# Patient Record
Sex: Male | Born: 1975 | Race: Black or African American | Hispanic: No | Smoking: Former smoker
Health system: Southern US, Community
[De-identification: ages and names within clinical notes are randomized; demographics above are authoritative.]

## PROBLEM LIST (undated history)

## (undated) DIAGNOSIS — R809 Proteinuria, unspecified: Secondary | ICD-10-CM

## (undated) DIAGNOSIS — F419 Anxiety disorder, unspecified: Secondary | ICD-10-CM

## (undated) DIAGNOSIS — F17200 Nicotine dependence, unspecified, uncomplicated: Secondary | ICD-10-CM

## (undated) DIAGNOSIS — M549 Dorsalgia, unspecified: Secondary | ICD-10-CM

## (undated) DIAGNOSIS — G8929 Other chronic pain: Secondary | ICD-10-CM

## (undated) DIAGNOSIS — Z973 Presence of spectacles and contact lenses: Secondary | ICD-10-CM

## (undated) DIAGNOSIS — K5790 Diverticulosis of intestine, part unspecified, without perforation or abscess without bleeding: Secondary | ICD-10-CM

## (undated) DIAGNOSIS — E785 Hyperlipidemia, unspecified: Secondary | ICD-10-CM

## (undated) DIAGNOSIS — E119 Type 2 diabetes mellitus without complications: Secondary | ICD-10-CM

## (undated) DIAGNOSIS — L910 Hypertrophic scar: Secondary | ICD-10-CM

## (undated) DIAGNOSIS — I10 Essential (primary) hypertension: Secondary | ICD-10-CM

## (undated) DIAGNOSIS — F329 Major depressive disorder, single episode, unspecified: Secondary | ICD-10-CM

## (undated) DIAGNOSIS — F32A Depression, unspecified: Secondary | ICD-10-CM

## (undated) HISTORY — DX: Anxiety disorder, unspecified: F41.9

## (undated) HISTORY — DX: Proteinuria, unspecified: R80.9

## (undated) HISTORY — DX: Presence of spectacles and contact lenses: Z97.3

## (undated) HISTORY — DX: Essential (primary) hypertension: I10

## (undated) HISTORY — DX: Depression, unspecified: F32.A

## (undated) HISTORY — DX: Hyperlipidemia, unspecified: E78.5

## (undated) HISTORY — DX: Diverticulosis of intestine, part unspecified, without perforation or abscess without bleeding: K57.90

## (undated) HISTORY — PX: ANTERIOR CRUCIATE LIGAMENT REPAIR: SHX115

## (undated) HISTORY — DX: Dorsalgia, unspecified: M54.9

## (undated) HISTORY — DX: Hypertrophic scar: L91.0

## (undated) HISTORY — DX: Type 2 diabetes mellitus without complications: E11.9

## (undated) HISTORY — DX: Major depressive disorder, single episode, unspecified: F32.9

## (undated) HISTORY — DX: Other chronic pain: G89.29

## (undated) HISTORY — DX: Nicotine dependence, unspecified, uncomplicated: F17.200

---

## 1999-09-23 ENCOUNTER — Emergency Department (HOSPITAL_COMMUNITY): Admission: EM | Admit: 1999-09-23 | Discharge: 1999-09-23 | Payer: Self-pay | Admitting: Emergency Medicine

## 2003-01-19 ENCOUNTER — Emergency Department (HOSPITAL_COMMUNITY): Admission: EM | Admit: 2003-01-19 | Discharge: 2003-01-19 | Payer: Self-pay | Admitting: Emergency Medicine

## 2003-11-24 LAB — PULMONARY FUNCTION TEST

## 2004-07-23 ENCOUNTER — Emergency Department (HOSPITAL_COMMUNITY): Admission: EM | Admit: 2004-07-23 | Discharge: 2004-07-23 | Payer: Self-pay | Admitting: *Deleted

## 2004-10-22 ENCOUNTER — Emergency Department (HOSPITAL_COMMUNITY): Admission: EM | Admit: 2004-10-22 | Discharge: 2004-10-22 | Payer: Self-pay | Admitting: Emergency Medicine

## 2005-08-02 ENCOUNTER — Emergency Department (HOSPITAL_COMMUNITY): Admission: EM | Admit: 2005-08-02 | Discharge: 2005-08-03 | Payer: Self-pay | Admitting: Emergency Medicine

## 2005-11-07 ENCOUNTER — Ambulatory Visit: Payer: Self-pay | Admitting: Internal Medicine

## 2005-11-25 ENCOUNTER — Ambulatory Visit: Payer: Self-pay | Admitting: Internal Medicine

## 2005-11-25 ENCOUNTER — Ambulatory Visit: Payer: Self-pay | Admitting: *Deleted

## 2006-07-14 DIAGNOSIS — E119 Type 2 diabetes mellitus without complications: Secondary | ICD-10-CM

## 2006-07-14 DIAGNOSIS — I10 Essential (primary) hypertension: Secondary | ICD-10-CM

## 2006-07-14 DIAGNOSIS — E785 Hyperlipidemia, unspecified: Secondary | ICD-10-CM

## 2006-07-14 HISTORY — DX: Type 2 diabetes mellitus without complications: E11.9

## 2006-07-14 HISTORY — DX: Essential (primary) hypertension: I10

## 2006-07-14 HISTORY — DX: Hyperlipidemia, unspecified: E78.5

## 2006-07-24 ENCOUNTER — Ambulatory Visit: Payer: Self-pay | Admitting: Internal Medicine

## 2006-08-12 ENCOUNTER — Ambulatory Visit: Payer: Self-pay | Admitting: Internal Medicine

## 2007-03-31 ENCOUNTER — Encounter (INDEPENDENT_AMBULATORY_CARE_PROVIDER_SITE_OTHER): Payer: Self-pay | Admitting: *Deleted

## 2007-03-31 DIAGNOSIS — E7800 Pure hypercholesterolemia, unspecified: Secondary | ICD-10-CM

## 2007-03-31 DIAGNOSIS — Z9889 Other specified postprocedural states: Secondary | ICD-10-CM

## 2007-03-31 DIAGNOSIS — E118 Type 2 diabetes mellitus with unspecified complications: Secondary | ICD-10-CM

## 2007-03-31 DIAGNOSIS — E1165 Type 2 diabetes mellitus with hyperglycemia: Secondary | ICD-10-CM

## 2007-03-31 DIAGNOSIS — E1159 Type 2 diabetes mellitus with other circulatory complications: Secondary | ICD-10-CM

## 2007-03-31 DIAGNOSIS — I10 Essential (primary) hypertension: Secondary | ICD-10-CM

## 2007-03-31 DIAGNOSIS — L219 Seborrheic dermatitis, unspecified: Secondary | ICD-10-CM

## 2007-06-15 ENCOUNTER — Ambulatory Visit: Payer: Self-pay | Admitting: Internal Medicine

## 2007-06-15 DIAGNOSIS — F39 Unspecified mood [affective] disorder: Secondary | ICD-10-CM

## 2007-06-15 LAB — CONVERTED CEMR LAB
Blood in Urine, dipstick: NEGATIVE
Glucose, Urine, Semiquant: NEGATIVE
Hgb A1c MFr Bld: 6.4 %
Ketones, urine, test strip: NEGATIVE
Urobilinogen, UA: NEGATIVE
WBC Urine, dipstick: NEGATIVE
pH: 6.5

## 2007-06-27 ENCOUNTER — Encounter (INDEPENDENT_AMBULATORY_CARE_PROVIDER_SITE_OTHER): Payer: Self-pay | Admitting: Internal Medicine

## 2007-06-27 LAB — CONVERTED CEMR LAB
Albumin: 4.8 g/dL (ref 3.5–5.2)
Basophils Relative: 0 % (ref 0–1)
CO2: 27 meq/L (ref 19–32)
Chloride: 100 meq/L (ref 96–112)
Creatinine, Ser: 1.07 mg/dL (ref 0.40–1.50)
HCT: 47.3 % (ref 39.0–52.0)
HDL: 45 mg/dL (ref >39)
Lymphocytes Relative: 33 % (ref 12–46)
Lymphs Abs: 2 10*3/uL (ref 0.7–4.0)
MCHC: 33.6 g/dL (ref 30.0–36.0)
MCV: 93.3 fL (ref 78.0–100.0)
Monocytes Absolute: 0.6 10*3/uL (ref 0.1–1.0)
Neutrophils Relative %: 57 % (ref 43–77)
Potassium: 4.2 meq/L (ref 3.5–5.3)
RBC: 5.07 M/uL (ref 4.22–5.81)
Sodium: 138 meq/L (ref 135–145)
Total CHOL/HDL Ratio: 5
Total Protein: 8 g/dL (ref 6.0–8.3)
Triglycerides: 146 mg/dL (ref <150)

## 2007-08-19 ENCOUNTER — Ambulatory Visit: Payer: Self-pay | Admitting: Internal Medicine

## 2007-08-19 LAB — CONVERTED CEMR LAB: Blood Glucose, Fingerstick: 147

## 2008-07-19 ENCOUNTER — Telehealth (INDEPENDENT_AMBULATORY_CARE_PROVIDER_SITE_OTHER): Payer: Self-pay | Admitting: Internal Medicine

## 2008-08-04 ENCOUNTER — Ambulatory Visit: Payer: Self-pay | Admitting: Internal Medicine

## 2008-08-04 LAB — CONVERTED CEMR LAB
BUN: 14 mg/dL (ref 6–23)
Basophils Absolute: 0 10*3/uL (ref 0.0–0.1)
Basophils Relative: 0 % (ref 0–1)
CO2: 25 meq/L (ref 19–32)
Chloride: 101 meq/L (ref 96–112)
Creatinine, Ser: 1.13 mg/dL (ref 0.40–1.50)
Eosinophils Relative: 1 % (ref 0–5)
Glucose, Bld: 118 mg/dL — ABNORMAL HIGH (ref 70–99)
HCT: 46.4 % (ref 39.0–52.0)
Hemoglobin: 15.8 g/dL (ref 13.0–17.0)
LDL Cholesterol: 113 mg/dL — ABNORMAL HIGH (ref 0–99)
Lymphs Abs: 2.2 10*3/uL (ref 0.7–4.0)
MCHC: 34.1 g/dL (ref 30.0–36.0)
Microalb, Ur: 3.44 mg/dL — ABNORMAL HIGH (ref 0.00–1.89)
Neutrophils Relative %: 62 % (ref 43–77)
Platelets: 329 10*3/uL (ref 150–400)
RDW: 13.5 % (ref 11.5–15.5)
Sodium: 139 meq/L (ref 135–145)
Total Bilirubin: 0.4 mg/dL (ref 0.3–1.2)
Total CHOL/HDL Ratio: 3.9
Triglycerides: 93 mg/dL (ref <150)
VLDL: 19 mg/dL (ref 0–40)
WBC: 7.5 10*3/uL (ref 4.0–10.5)

## 2008-08-11 ENCOUNTER — Telehealth (INDEPENDENT_AMBULATORY_CARE_PROVIDER_SITE_OTHER): Payer: Self-pay | Admitting: Internal Medicine

## 2008-08-11 ENCOUNTER — Ambulatory Visit: Payer: Self-pay | Admitting: Internal Medicine

## 2008-08-11 DIAGNOSIS — F341 Dysthymic disorder: Secondary | ICD-10-CM | POA: Insufficient documentation

## 2008-08-13 ENCOUNTER — Encounter (INDEPENDENT_AMBULATORY_CARE_PROVIDER_SITE_OTHER): Payer: Self-pay | Admitting: Internal Medicine

## 2008-09-15 ENCOUNTER — Ambulatory Visit: Payer: Self-pay | Admitting: Internal Medicine

## 2008-10-03 ENCOUNTER — Telehealth (INDEPENDENT_AMBULATORY_CARE_PROVIDER_SITE_OTHER): Payer: Self-pay | Admitting: Internal Medicine

## 2008-10-12 ENCOUNTER — Ambulatory Visit: Payer: Self-pay | Admitting: Internal Medicine

## 2008-10-12 LAB — CONVERTED CEMR LAB: Blood Glucose, Fingerstick: 188

## 2008-11-14 ENCOUNTER — Ambulatory Visit: Payer: Self-pay | Admitting: Internal Medicine

## 2008-11-14 LAB — CONVERTED CEMR LAB
AST: 16 units/L (ref 0–37)
Albumin: 4.4 g/dL (ref 3.5–5.2)
Alkaline Phosphatase: 57 units/L (ref 39–117)
Cholesterol: 121 mg/dL (ref 0–200)
LDL Cholesterol: 62 mg/dL (ref 0–99)
Total Bilirubin: 0.5 mg/dL (ref 0.3–1.2)
Total CHOL/HDL Ratio: 2.9
Total Protein: 7.5 g/dL (ref 6.0–8.3)
Triglycerides: 87 mg/dL (ref <150)

## 2008-11-20 ENCOUNTER — Encounter (INDEPENDENT_AMBULATORY_CARE_PROVIDER_SITE_OTHER): Payer: Self-pay | Admitting: Internal Medicine

## 2009-10-25 ENCOUNTER — Ambulatory Visit: Payer: Self-pay | Admitting: Internal Medicine

## 2009-10-25 DIAGNOSIS — IMO0001 Reserved for inherently not codable concepts without codable children: Secondary | ICD-10-CM

## 2009-10-25 DIAGNOSIS — F528 Other sexual dysfunction not due to a substance or known physiological condition: Secondary | ICD-10-CM

## 2009-10-25 LAB — CONVERTED CEMR LAB: Blood Glucose, Fingerstick: 130

## 2009-10-30 ENCOUNTER — Ambulatory Visit: Payer: Self-pay | Admitting: Internal Medicine

## 2010-01-22 ENCOUNTER — Encounter (INDEPENDENT_AMBULATORY_CARE_PROVIDER_SITE_OTHER): Payer: Self-pay | Admitting: Internal Medicine

## 2010-08-13 NOTE — Letter (Signed)
Summary: SUMMARY OF DIABETIC CLINICAL FINDINGS  SUMMARY OF DIABETIC CLINICAL FINDINGS   Imported By: Arta Bruce 01/29/2010 11:39:30  _____________________________________________________________________  External Attachment:    Type:   Image     Comment:   External Document

## 2010-08-13 NOTE — Assessment & Plan Note (Signed)
Summary: dm/tmm   Vital Signs:  Patient profile:   35 year old male Weight:      257 pounds BMI:     34.03 Temp:     97.9 degrees F Pulse rate:   82 / minute Pulse rhythm:   regular Resp:     20 per minute BP sitting:   131 / 83  (left arm) Cuff size:   large  Vitals Entered By: Vesta Mixer CMA (October 25, 2009 9:53 AM) CC: f/u diabetes check Is Patient Diabetic? Yes Pain Assessment Patient in pain? no      CBG Result 130  Does patient need assistance? Ambulation Normal   CC:  f/u diabetes check.  History of Present Illness: 1.  Hypertension:  Taking meds regularly currently, but has not been compliant throughout the year.  2.  Hypercholesterolemia: was at goal last spring, but up and down with taking meds.  Feels he is having muscle aches on and off.  Symptoms in arms mainly--occurs for a minute, then goes away.  Occurs every other day.  3.  DM:  Sugars running between 90 and 140.  A1C today is: 6.5%    Has not had an eye checked in some time.  No numbness and tingling in hands and feet.  Not checking feet nightly.    4.  Depression:  Still taking Zoloft.  States other notice if he misses it.  They tell him he looks anxious, irritated and has problems controlling anger when he is off.  5.  ED:  for a year.  Went and obtained a root supplement called [employer]--helped only the first time.  Able to get an erection--definitely with nighttime tumescence that is good.  But with intercourse, not as good as he feels it should be.  Difficulties maintaining the erection.  Pt. not with a particular partner and basically when having sex, that is the only connection with the other individual.  He does feel like he feels pressured at this point not to fail with an erection.  Not interested in counseling.  Current Medications (verified): 1)  Hydrochlorothiazide 25 Mg  Tabs (Hydrochlorothiazide) .Marland Kitchen.. 1 By Mouth Q.a.m. 2)  Metformin Hcl 1000 Mg  Tabs (Metformin Hcl) .Marland Kitchen.. 1 By Mouth Two  Times A Day With Meals 3)  Nizoral 2 %  Sham (Ketoconazole) 4)  Benazepril Hcl 20 Mg  Tabs (Benazepril Hcl) .Marland Kitchen.. 1 Tab By Mouth Daily 5)  Lipitor 20 Mg  Tabs (Atorvastatin Calcium) .Marland Kitchen.. 1 Tab By Mouth Daily 6)  Zoloft 50 Mg Tabs (Sertraline Hcl) .Marland Kitchen.. 1 Tab By Mouth Daily  Allergies (verified): 1)  ! Penicillin  Physical Exam  Lungs:  Normal respiratory effort, chest expands symmetrically. Lungs are clear to auscultation, no crackles or wheezes. Heart:  Normal rate and regular rhythm. S1 and S2 normal without gallop, murmur, click, rub or other extra sounds.  Radial pulses normal and equal Extremities:  No edema   Impression & Recommendations:  Problem # 1:  MUSCLE PAIN (ICD-729.1)  Orders: T-CK Total ([id number])  Problem # 2:  DEPRESSION/ANXIETY (ICD-300.4) Doing well on Zoloft--continue  Problem # 3:  HYPERCHOLESTEROLEMIA (ICD-272.0) Nonfasting today His updated medication list for this problem includes:    Lipitor 20 Mg Tabs (Atorvastatin calcium) .Marland Kitchen... 1 tab by mouth daily  Problem # 4:  DM (ICD-250.00) Well controlled. Encouraged weight loss with lifestyle changes His updated medication list for this problem includes:    Metformin Hcl 1000 Mg Tabs (Metformin hcl) .Marland Kitchen... 1 by  mouth two times a day with meals    Benazepril Hcl 20 Mg Tabs (Benazepril hcl) .Marland Kitchen... 1 tab by mouth daily  Problem # 5:  HYPERTENSION (ICD-401.9) Controlled His updated medication list for this problem includes:    Hydrochlorothiazide 25 Mg Tabs (Hydrochlorothiazide) .Marland Kitchen... 1 by mouth q.a.m.    Benazepril Hcl 20 Mg Tabs (Benazepril hcl) .Marland Kitchen... 1 tab by mouth daily  Problem # 6:  ERECTILE DYSFUNCTION, NON-ORGANIC (ICD-302.72) Suspect nonorganic based on pt's history, but certainly has risk factors for organic or medication cause. Pt. not interested in counseling. Will let me know if worsens and will then need to consider changing meds.  Complete Medication List: 1)  Hydrochlorothiazide 25 Mg  Tabs (Hydrochlorothiazide) .Marland Kitchen.. 1 by mouth q.a.m. 2)  Metformin Hcl 1000 Mg Tabs (Metformin hcl) .Marland Kitchen.. 1 by mouth two times a day with meals 3)  Nizoral 2 % Sham (Ketoconazole) 4)  Benazepril Hcl 20 Mg Tabs (Benazepril hcl) .Marland Kitchen.. 1 tab by mouth daily 5)  Lipitor 20 Mg Tabs (Atorvastatin calcium) .Marland Kitchen.. 1 tab by mouth daily 6)  Zoloft 50 Mg Tabs (Sertraline hcl) .Marland Kitchen.. 1 tab by mouth daily  Other Orders: Capillary Blood Glucose/CBG (78295)  Patient Instructions: 1)  Schedule [facility] 2)  FLP, CMET, CBC, Urine microalbumin, UA  with fasting lab appt in next 2 weeks. 3)  CPP with Dr. Delrae Alfred in 4 months  Prescriptions: BENAZEPRIL HCL 20 MG  TABS (BENAZEPRIL HCL) 1 tab by mouth daily  #30 Each x 11   Entered and Authorized by:   Julieanne Manson MD   Signed by:   Julieanne Manson MD on 10/25/2009   Method used:   Electronically to        [facility] [phone]* (retail)       [address]       [place], [place]  [address]       Ph: [phone]       Fax: [phone]   RxID:   [id number] METFORMIN HCL 1000 MG  TABS (METFORMIN HCL) 1 by mouth two times a day with meals  #60 Each x 11   Entered and Authorized by:   Julieanne Manson MD   Signed by:   Julieanne Manson MD on 10/25/2009   Method used:   Electronically to        [facility] [phone]* (retail)       [address]       [place], [place]  [address]       Ph: [phone]       Fax: [phone]   RxID:   [id number] HYDROCHLOROTHIAZIDE 25 MG  TABS (HYDROCHLOROTHIAZIDE) 1 by mouth q.a.m.  #30 Each x 11   Entered and Authorized by:   Julieanne Manson MD   Signed by:   Julieanne Manson MD on 10/25/2009   Method used:   Electronically to        [facility] [phone]* (retail)       [address]       [place], [place]  [address]       Ph: [phone]       Fax: [phone]   RxID:   [id number] ZOLOFT 50 MG TABS (SERTRALINE HCL) 1 tab by mouth daily  #30 x 11   Entered and Authorized by:    Julieanne Manson MD   Signed by:   Julieanne Manson MD on 10/25/2009   Method used:   Faxed to .Marland KitchenMarland Kitchen  [facility] (retail)       [address] [place], [place]  [address]       Ph: [phone]       Fax: [phone]   RxID:   [id number] LIPITOR 20 MG  TABS (ATORVASTATIN CALCIUM) 1 tab by mouth daily  #30 x 11   Entered and Authorized by:   Julieanne Manson MD   Signed by:   Julieanne Manson MD on 10/25/2009   Method used:   Faxed to ...       [facility] (retail)       [address] [place], [place]  [address]       Ph: [phone]       Fax: [phone]   RxID:   [id number]

## 2010-11-29 NOTE — Consult Note (Signed)
NAMEABRAN, GAVIGAN NO.:  [id number]   MEDICAL RECORD NO.:  [id number]          PATIENT TYPE:  EMS   LOCATION:  [facility]                         FACILITY:  [facility]   PHYSICIAN:  Danae Chen, M.D.DATE OF BIRTH:  24-May-1976   DATE OF CONSULTATION:  07/23/2004  DATE OF DISCHARGE:                                   CONSULTATION   PRIMARY CARE PHYSICIAN:  Ernestina Penna, M.D.   CHIEF COMPLAINT:  New diagnosis of diabetes with hyperglycemia.   HISTORY OF PRESENT ILLNESS:  The patient is a very pleasant African American  male who presented to Dr. Christell Constant today for the very first time to establish  as new patient with complaints of three-month history of around a 40-pound  weight loss, polydipsia, polyuria and nocturia.  He has a strong family  history of diabetes with both the grandmother and father with diabetes and  hypertension and his dad is on dialysis secondary to end-stage renal  disease.  The patient has not been seen by a doctor prior to his visit  today.  Aside from his weight loss and urinary frequency and thirst, he  denies any other symptoms such as chest pain, dizziness, blurred vision or  numbness and tingling in his hands or feet.   PAST MEDICAL HISTORY:  Not significant.  He does report that he has had high  blood pressure when checked sporadically but never been on medication.   PAST SURGICAL HISTORY:  Torn ACL secondary to football injury.   FAMILY HISTORY:  As noted, father with end-stage renal disease secondary to  diabetes and hypertension.  Grandmother with diabetes.   SOCIAL HISTORY:  He does smoke about a half pack per day.  Drinks occasional  beer.  He is single.  No children.  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   REVIEW OF SYMPTOMS:  Per history of present illness.   MEDICATIONS:  He is on no medications currently.   ALLERGIES:  No known drug allergies.   PHYSICAL  EXAMINATION:  GENERAL APPEARANCE:  He is in no acute distress.  VITAL SIGNS:  Temperature 98.2, blood pressure 148/96, pulse 104,  respiratory rate 22, O2 saturation 96% on room air.  Following the  administration of two liters IV fluid, his pulse was at 70, blood pressure  140/72.  HEENT:  Funduscopic examination is normal. Pupils are equal and reactive.  NECK:  Supple. No lymphadenopathy.  LUNGS:  Clear.  CARDIOVASCULAR:  Regular rate.  ABDOMEN:  Soft.  EXTREMITIES:  No peripheral edema.  NEUROLOGIC:  He has pinpoint prick sensation and two-point discretion is  intact bilaterally in his lower extremities.  Cranial nerves II-XII grossly  intact.   LABORATORY DATA:  Glucose of 542, sodium 129, potassium 4.4, chloride 94,  CO2 22, BUN 16, creatinine 1.2.  Calcium 9.4, total protein 7.6, albumin  3.7, AST 31, ALT 31, alkaline phosphatase 84, total bilirubin 1.3.  White  count 7.4, hemoglobin 6.2, platelets 268.  Urinalysis shows a specific  gravity of 1.036, urine glucose is high,  small amount of ketone, no protein.  He has received 14 units of insulin in the ED as well as normal saline two  liters.   He currently has no complaints and feels well.   His weight approximately was 310 pounds three months ago.  In the office  today and in primary care's office, was 269.   IMPRESSION:  A 35 year old with new diagnosis of diabetes and probably  hypertension.  His cardiac risk factors also include tobacco use, family  history of diabetes and hypertension.   PLAN:  1.  We will start him on an ACE inhibitor, diuretic and oral hypoglycemics.      Since he is self-employed and paying for his own medications, we are      attempting to give him low cost medicines.  Benzopril 20 mg scored      tablets, half tablet one p.o. daily.  Glipizide 10 mg one p.o. daily.      Hydrochlorothiazide 25 mg one p.o. daily.  Metformin 500 mg one p.o.      b.i.d.  He also will be instructed to take an aspirin  one baby aspirin      once daily.  The patient has a follow-up appointment with Dr. Christell Constant on      Tuesday, July 30, 2004, at 11 a.m.  We will check a baseline      hemoglobin A1C and a lipid panel here as well and forward that to his      primary care physician's office.  He also needs tobacco cessation      counseling.  Most importantly, he needs diabetic education and      management with his primary care office and to be assessed for a      Glucometer so he can check his blood sugars as well.  I spoke with the      patient at length, more than 30 minutes, regarding his diagnosis and the      importance of very strict blood glucose and blood pressure control to      prevent the sequelae of uncontrolled diabetes and hypertension of which      can be heart disease, eye disease, kidney disease and peripheral      vascular disease.  The patient understands and is motivated to lead a      healthier lifestyle and control his chronic medical issues.      RLK/MEDQ  D:  07/23/2004  T:  07/23/2004  Job:  [id number]   cc:   Ernestina Penna, M.D.  [address] [place]  [place] [address]  Fax: [phone]

## 2012-09-09 ENCOUNTER — Ambulatory Visit: Payer: Self-pay | Admitting: Family Medicine

## 2012-10-18 ENCOUNTER — Ambulatory Visit: Payer: Self-pay | Admitting: Internal Medicine

## 2014-11-20 ENCOUNTER — Ambulatory Visit: Payer: Self-pay | Admitting: Medical

## 2015-01-04 ENCOUNTER — Encounter: Payer: Self-pay | Admitting: Medical

## 2015-01-04 ENCOUNTER — Ambulatory Visit (INDEPENDENT_AMBULATORY_CARE_PROVIDER_SITE_OTHER): Payer: 59 | Admitting: Medical

## 2015-01-04 VITALS — BP 120/78 | HR 72 | Temp 98.2°F | Resp 15 | Ht 74.0 in | Wt 255.0 lb

## 2015-01-04 DIAGNOSIS — L729 Follicular cyst of the skin and subcutaneous tissue, unspecified: Secondary | ICD-10-CM | POA: Diagnosis not present

## 2015-01-04 DIAGNOSIS — Z23 Encounter for immunization: Secondary | ICD-10-CM

## 2015-01-04 DIAGNOSIS — Z0000 Encounter for general adult medical examination without abnormal findings: Secondary | ICD-10-CM | POA: Diagnosis not present

## 2015-01-04 DIAGNOSIS — L910 Hypertrophic scar: Secondary | ICD-10-CM | POA: Diagnosis not present

## 2015-01-04 DIAGNOSIS — E669 Obesity, unspecified: Secondary | ICD-10-CM | POA: Diagnosis not present

## 2015-01-04 DIAGNOSIS — M25531 Pain in right wrist: Secondary | ICD-10-CM | POA: Diagnosis not present

## 2015-01-04 DIAGNOSIS — M62838 Other muscle spasm: Secondary | ICD-10-CM | POA: Diagnosis not present

## 2015-01-04 LAB — POCT URINALYSIS DIPSTICK
Bilirubin, UA: NEGATIVE
Blood, UA: NEGATIVE
Glucose, UA: NEGATIVE
KETONES UA: NEGATIVE
Leukocytes, UA: NEGATIVE
Nitrite, UA: NEGATIVE
PH UA: 6
UROBILINOGEN UA: NEGATIVE

## 2015-01-04 MED ORDER — CYCLOBENZAPRINE HCL 10 MG PO TABS: 10.0000 mg | ORAL_TABLET | Freq: Every day | ORAL | Status: DC

## 2015-01-04 NOTE — Addendum Note (Signed)
Addended by: Louie Bun on: 01/04/2015 04:26 PM   Modules accepted: Orders

## 2015-01-04 NOTE — Progress Notes (Signed)
Subjective:   HPI  Dustin Zamora is a 39 y.o. male who presents for a complete physical.   [facility], and at one point [place].    Medical care team/other doctors includes:  Sees Dr Towanda Malkin plastic surgeon  Pre[facility]ative care:[facility] on [address] Last physical or labs: 2015 Sees dentist yearly: no Sees eye doctor, [facility] [place] Last tetanus vaccine, TD or Tdap: unknown   Concerns: Needs physical, knot on back, urinary freq at night, would like muscle relaxant because he works out a lot.  Has knot of right low back x 51mo, sometimes gets smaller.  Denies injury or trauma.  Gets low back pain from time to time.   Pulled muscle in back weight lifting several times prior . Has used muscle relaxer prior.  Would like to have this from time to time.    Gets some right wrist pain from time to time, worse with lifting objects  Has some night time urination without other prostate symptoms.   Gets up sometimes 1-3 times per night, but no urinary chestiest, urgency, stream changes.  No blood in urine.    Reviewed their medical, surgical, family, social, medication, and allergy history and updated chart as appropriate.  Past Medical History  Diagnosis Date  . Hypertension 2008  . Hyperlipidemia 2008  . Diabetes mellitus without complication 6629  . Anxiety   . Depression     in remote past  . Chronic back pain   . Keloid   . Wears glasses     Past Surgical History  Procedure Laterality Date  . Anterior cruciate ligament repair      right ACL repair, patellar repair, remove cartilage damage; 39yo    History   Social History  . Marital Status: Married    Spouse Name: N/A  . Number of Children: N/A  . Years of Education: N/A   Occupational History  . Not on file.   Social History Main Topics  . Smoking status: Current Every Day Smoker -- 0.50 packs/day for 15 years    Types: Cigars, Cigarettes   . Smokeless tobacco: Not on file  . Alcohol Use: 0.0 oz/week    0 Standard drinks or equivalent per week  . Drug Use: Yes    Special: Marijuana  . Sexual Activity: Not on file   Other Topics Concern  . 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   Exercise - goes to the gym, weights, some cardio    Family History  Problem Relation Age of Onset  . Kidney disease Father   . Vascular Disease Father   . Stroke Paternal Grandmother   . Diabetes Paternal Grandmother   . Cancer Neg Hx   . Heart disease Neg Hx      Current outpatient prescriptions:  .  cyclobenzaprine (FLEXERIL) 10 MG tablet, Take 1 tablet (10 mg total) by mouth at bedtime., Disp: 30 tablet, Rfl: 0  Allergies  Allergen Reactions  . Penicillins Anaphylaxis    Review of Systems Constitutional: -fever, -chills, -sweats, -unexpected weight change, -decreased appetite, -fatigue Allergy: -sneezing, -itching, -congestion Dermatology: -changing moles, --rash, -lumps ENT: -runny nose, -ear pain, -sore throat, -hoarseness, -sinus pain, -teeth pain, - ringing in ears, -hearing loss, -nosebleeds Cardiology: -chest pain, -palpitations, -swelling, -difficulty breathing when lying flat, -waking up short of  breath Respiratory: -cough, -shortness of breath, -difficulty breathing with exercise or exertion, -wheezing, -coughing up blood Gastroenterology: -abdominal pain, -nausea, -vomiting, -diarrhea, -constipation, -blood in stool, -changes in bowel movement, -difficulty swallowing or eating Hematology: -bleeding, -bruising  Musculoskeletal: +joint aches, +muscle aches, -joint swelling, -back pain, -neck pain, -cramping, -changes in gait Ophthalmology: denies vision changes, eye redness, itching, discharge Urology: -burning with urination, -difficulty urinating, -blood in urine, +urinary frequency, -urgency, -incontinence Neurology:  -headache, -weakness, -tingling, -numbness, -memory loss, -falls, -dizziness Psychology: -depressed mood, -agitation, -sleep problems     Objective:   Physical Exam  BP 120/78 mmHg  Pulse 72  Temp(Src) 98.2 F (36.8 C) (Oral)  Resp 15  Ht 6\' 2"  (1.88 m)  Wt 255 lb (115.667 kg)  BMI 32.73 kg/m2  General appearance: alert, no distress, WD/WN, pleasant AA male Skin: right low thoracic back with 1.5 cm diameter roundish nodule suggestive of cyst, no induration, no warmth, no fluctuance.   striae of abdomen.  Scattered macules of back, tattoo right forearm,  HEENT: normocephalic, conjunctiva/corneas normal, sclerae anicteric, PERRLA, EOMi, nares patent, no discharge or erythema, pharynx normal Oral cavity: MMM, tongue normal, teeth in good repair Neck: supple, no lymphadenopathy, no thyromegaly, no masses, normal ROM, no bruits Chest: non tender, normal shape and expansion Heart: RRR, normal S1, S2, no murmurs Lungs: CTA bilaterally, no wheezes, rhonchi, or rales Abdomen: +bs, soft, non tender, non distended, no masses, no hepatomegaly, no splenomegaly, no bruits Back: non tender, normal ROM, no scoliosis Musculoskeletal: right lateral and anterior knee surgical scar, mild tenderness of medial wrists, mild pain in same area with wrist ROM, otherwise upper extremities non tender, no obvious deformity, normal ROM throughout, lower extremities non tender, no obvious deformity, normal ROM throughout Extremities: no edema, no cyanosis, no clubbing Pulses: 2+ symmetric, upper and lower extremities, normal cap refill Neurological: alert, oriented x 3, CN2-12 intact, strength normal upper extremities and lower extremities, sensation normal throughout, DTRs 2+ throughout, no cerebellar signs, gait normal Psychiatric: normal affect, behavior normal, pleasant  GU: normal male external genitalia, circumcised, nontender, no masses, no hernia, no lymphadenopathy Rectal: deferred due to age 29yo and no  indication today   Assessment and Plan :    Encounter Diagnoses  Name Primary?  . Encounter for health maintenance examination in adult Yes  . Obesity   . Muscle spasm   . Cyst of skin   . Keloid   . Need for Tdap vaccination   . Wrist pain, right     Physical exam - discussed healthy lifestyle, diet, exercise, preventative care, vaccinations, and addressed their concerns.   See your dentist yearly for routine dental care including hygiene visits twice yearly. See your eye doctor yearly for routine vision care. discussed testicular cancer screening He will return for fasting labs  C/t routine exercise, healthy diet, continued weight loss measures Cyst of skin of right low back, reassured, leave alone He seems prone to keloid Wrist pain right - possible tendonitis, use OTC reinforced wrist splint x 2-3 wk, less weight with wrists/arms weight lifting.    Muscle spasm - can use flexeril prn, heat, massage, stretching routine daily Vaccinations: advised yearly flu vaccine  Counseled on the Tdap (tetanus, diptheria, and acellular pertussis) vaccine.  Vaccine information sheet given. Tdap vaccine given after consent obtained. Follow up pending labs

## 2015-01-17 ENCOUNTER — Telehealth: Payer: Self-pay | Admitting: Medical

## 2015-01-17 NOTE — Telephone Encounter (Signed)
Records revc'd from [employer]

## 2015-01-23 ENCOUNTER — Encounter: Payer: Self-pay | Admitting: Medical

## 2015-01-29 ENCOUNTER — Encounter: Payer: Self-pay | Admitting: Medical

## 2015-01-29 ENCOUNTER — Ambulatory Visit (INDEPENDENT_AMBULATORY_CARE_PROVIDER_SITE_OTHER): Payer: 59 | Admitting: Medical

## 2015-01-29 VITALS — BP 118/80 | HR 72 | Temp 98.3°F | Wt 257.0 lb

## 2015-01-29 DIAGNOSIS — Z125 Encounter for screening for malignant neoplasm of prostate: Secondary | ICD-10-CM | POA: Diagnosis not present

## 2015-01-29 DIAGNOSIS — R229 Localized swelling, mass and lump, unspecified: Secondary | ICD-10-CM

## 2015-01-29 DIAGNOSIS — L02213 Cutaneous abscess of chest wall: Secondary | ICD-10-CM

## 2015-01-29 DIAGNOSIS — E669 Obesity, unspecified: Secondary | ICD-10-CM | POA: Diagnosis not present

## 2015-01-29 DIAGNOSIS — L910 Hypertrophic scar: Secondary | ICD-10-CM | POA: Diagnosis not present

## 2015-01-29 DIAGNOSIS — B350 Tinea barbae and tinea capitis: Secondary | ICD-10-CM

## 2015-01-29 DIAGNOSIS — Z0000 Encounter for general adult medical examination without abnormal findings: Secondary | ICD-10-CM

## 2015-01-29 LAB — COMPREHENSIVE METABOLIC PANEL
ALBUMIN: 4.2 g/dL (ref 3.5–5.2)
ALT: 17 U/L (ref 0–53)
AST: 15 U/L (ref 0–37)
Alkaline Phosphatase: 56 U/L (ref 39–117)
BUN: 16 mg/dL (ref 6–23)
CALCIUM: 9.4 mg/dL (ref 8.4–10.5)
CHLORIDE: 102 meq/L (ref 96–112)
CO2: 28 meq/L (ref 19–32)
CREATININE: 1.18 mg/dL (ref 0.50–1.35)
GLUCOSE: 153 mg/dL — AB (ref 70–99)
POTASSIUM: 4.8 meq/L (ref 3.5–5.3)
SODIUM: 139 meq/L (ref 135–145)
TOTAL PROTEIN: 7.3 g/dL (ref 6.0–8.3)
Total Bilirubin: 0.6 mg/dL (ref 0.2–1.2)

## 2015-01-29 LAB — LIPID PANEL
CHOL/HDL RATIO: 5.8 ratio
Cholesterol: 220 mg/dL — ABNORMAL HIGH (ref 0–200)
HDL: 38 mg/dL — AB (ref >=40)
LDL Cholesterol: 163 mg/dL — ABNORMAL HIGH (ref 0–99)
TRIGLYCERIDES: 96 mg/dL (ref <150)
VLDL: 19 mg/dL (ref 0–40)

## 2015-01-29 LAB — CBC
HCT: 43.9 % (ref 39.0–52.0)
HEMOGLOBIN: 15 g/dL (ref 13.0–17.0)
MCH: 32.1 pg (ref 26.0–34.0)
MCHC: 34.2 g/dL (ref 30.0–36.0)
MCV: 93.8 fL (ref 78.0–100.0)
MPV: 9.5 fL (ref 8.6–12.4)
Platelets: 284 10*3/uL (ref 150–400)
RBC: 4.68 MIL/uL (ref 4.22–5.81)
RDW: 13.2 % (ref 11.5–15.5)
WBC: 5.2 10*3/uL (ref 4.0–10.5)

## 2015-01-29 LAB — HEMOGLOBIN A1C
Hgb A1c MFr Bld: 7.9 % — ABNORMAL HIGH (ref <5.7)
Mean Plasma Glucose: 180 mg/dL — ABNORMAL HIGH (ref <117)

## 2015-01-29 LAB — TSH: TSH: 1.36 u[IU]/mL (ref 0.350–4.500)

## 2015-01-29 MED ORDER — SULFAMETHOXAZOLE-TRIMETHOPRIM 800-160 MG PO TABS: 1.0000 | ORAL_TABLET | Freq: Two times a day (BID) | ORAL | Status: DC

## 2015-01-29 NOTE — Progress Notes (Signed)
Subjective: Here for several things  Here for fasting labs as f/u from his recent physical  His wife made him come back today to get prostate examined as he was reluctant to do this last time  He wants me to re-look at the nodule along his right back, wife wants clarification  The cyst on his chest centrally became red and infected last week, was rather large redness and swelling in diameter, then pus came out.  It is healing and much better now than last week, but had a sebaceous cyst there unchanged for years.  He does have hx/o keloid, and in the past plastic surgery was reluctant to cut this out for the possibility of keloid  ROS as in subjective  Objective: BP 118/80 mmHg  Pulse 72  Temp(Src) 98.3 F (36.8 C) (Oral)  Wt 257 lb (116.574 kg)  Gen: wd, wn, nad Attempted prostate exam, but he was so tense, unable to penetrate despite several attempts.  Anus otherwise normal Right posterior chest wall low thoracic back with 1.5 cm diameter roundish nodule suggestive of cyst, no induration, no warmth, no fluctuance. Central chest just below midline sternum with 3cm round raised pink lesion with open draining entry hole suggestive of infected sebaceous cyst, no current induration, fluctuance or warmth Left superior scalp with 2cm area of faint pink coloration without hair loss compared to surrounding area suggestive of early tinea    Assessment: Encounter Diagnoses  Name Primary?  . Cutaneous abscess of chest wall Yes  . Skin nodule   . Keloid scar   . Tinea capitis   . Encounter for health maintenance examination in adult   . Obesity   . Screening for prostate cancer     Plan: Abscess - begin bactrim, discussed hygiene, and recheck this week if worse. Otherwise recheck in 3-4 after it is healing up.   May end up needing biopsy if not healing, or consider plastic surgery consult.  He does have hx/o keloid. Skin nodule of right posterior chest wall.  Reassured.   This has been  small and unchanged for years per patient.   If he wants to get it removed we can refer to surgery or dermatology Of note, he does have tendency to keloid.  Tinea capitis - discussed treatment options, but he wants to stick with OTC selsun blue.  F/u prn Labs today fasting as f/u from recent physical

## 2015-01-30 LAB — PSA: PSA: 0.73 ng/mL (ref <=4.00)

## 2015-02-27 ENCOUNTER — Encounter: Payer: Self-pay | Admitting: Medical

## 2015-02-27 ENCOUNTER — Ambulatory Visit (INDEPENDENT_AMBULATORY_CARE_PROVIDER_SITE_OTHER): Payer: 59 | Admitting: Medical

## 2015-02-27 ENCOUNTER — Telehealth: Payer: Self-pay | Admitting: Medical

## 2015-02-27 VITALS — BP 124/82 | HR 88 | Wt 253.0 lb

## 2015-02-27 DIAGNOSIS — L910 Hypertrophic scar: Secondary | ICD-10-CM | POA: Diagnosis not present

## 2015-02-27 DIAGNOSIS — M5489 Other dorsalgia: Secondary | ICD-10-CM

## 2015-02-27 DIAGNOSIS — N4889 Other specified disorders of penis: Secondary | ICD-10-CM

## 2015-02-27 DIAGNOSIS — Z139 Encounter for screening, unspecified: Secondary | ICD-10-CM

## 2015-02-27 DIAGNOSIS — IMO0002 Reserved for concepts with insufficient information to code with codable children: Secondary | ICD-10-CM

## 2015-02-27 DIAGNOSIS — E785 Hyperlipidemia, unspecified: Secondary | ICD-10-CM

## 2015-02-27 DIAGNOSIS — E1165 Type 2 diabetes mellitus with hyperglycemia: Secondary | ICD-10-CM | POA: Diagnosis not present

## 2015-02-27 LAB — POCT GLYCOSYLATED HEMOGLOBIN (HGB A1C): Hemoglobin A1C: 5.2

## 2015-02-27 MED ORDER — TRIAMCINOLONE ACETONIDE 0.1 % EX CREA: 1.0000 "application " | TOPICAL_CREAM | Freq: Two times a day (BID) | CUTANEOUS | Status: DC

## 2015-02-27 MED ORDER — CYCLOBENZAPRINE HCL 10 MG PO TABS: 10.0000 mg | ORAL_TABLET | Freq: Every day | ORAL | Status: DC

## 2015-02-27 MED ORDER — ROSUVASTATIN CALCIUM 10 MG PO TABS: 10.0000 mg | ORAL_TABLET | Freq: Every day | ORAL | Status: DC

## 2015-02-27 MED ORDER — SULFAMETHOXAZOLE-TRIMETHOPRIM 800-160 MG PO TABS: 1.0000 | ORAL_TABLET | Freq: Two times a day (BID) | ORAL | Status: DC

## 2015-02-27 NOTE — Telephone Encounter (Signed)
i have faxed referral to [facility] will see who he has appointment with and will make referral with ins.

## 2015-02-27 NOTE — Progress Notes (Signed)
Subjective: Here for several concerns  After last visit he did more thinking and does want to start back on Crestor has been on this in the past .  He wants to hold off on metformin though, wants to recheck lab.  He has lost weight and working on healthier diet and exercise since last visit.  Needs refill on medication for back pain.  It helps. He is using exercising and stretching regularly  Has a new bump on his penis x 1 mo he is worried about.  He wants me to look at the skin lesions again on his chest.  No other aggravating or relieving factors. No other complaint.   Past Medical History  Diagnosis Date  . Hypertension 2008  . Hyperlipidemia 2008  . Diabetes mellitus without complication 5956  . Anxiety   . Depression     in remote past  . Chronic back pain   . Keloid   . Wears glasses    ROS as in subject   Objective: BP 124/82 mmHg  Pulse 88  Wt 253 lb (114.76 kg)  SpO2 97%  Gen: wd, wn, nad Skin: central chest above upper sternum with 3cm oval raised tissue which was abscessed last visit, but keloid appearing this visit.  No induration, fluctuance or warmth, no erythema Lungs clear Heart RRR, normal s1, s2, no murmurs GU: small whitish 84mm lump under skin of dorsal penis c/w pearly papule, benign appearing   Assessment: Encounter Diagnoses  Name Primary?  . Diabetes type 2, uncontrolled Yes  . Other back pain   . Risk and functional assessment   . Hyperlipidemia   . Pearly penile papules   . Keloid      Plan: diabetes type 2 - diet controlled currently, has made big lifestyle changes and weight loss since last visit.    Back pain - advised daily stretching and exercise, flexeril prn, NSAID prn hyperlipidemia - start Crestor after discussing risks/benefits and goals of therapy.  Pearly penile papules - reassured Keloid - referral to dermatology.  For now can use the triamcinolone cream F/u 41mo fasting

## 2015-02-27 NOTE — Telephone Encounter (Signed)
Refer to [facility] for chest skin lesion

## 2016-02-01 ENCOUNTER — Ambulatory Visit: Payer: Self-pay | Admitting: Family Medicine

## 2016-02-08 ENCOUNTER — Encounter: Payer: Self-pay | Admitting: Family Medicine

## 2016-08-05 ENCOUNTER — Ambulatory Visit (INDEPENDENT_AMBULATORY_CARE_PROVIDER_SITE_OTHER): Payer: BLUE CROSS/BLUE SHIELD | Admitting: Medical

## 2016-08-05 ENCOUNTER — Encounter: Payer: Self-pay | Admitting: Medical

## 2016-08-05 VITALS — BP 134/88 | HR 84 | Ht 73.0 in | Wt 246.8 lb

## 2016-08-05 DIAGNOSIS — R7301 Impaired fasting glucose: Secondary | ICD-10-CM | POA: Insufficient documentation

## 2016-08-05 DIAGNOSIS — F411 Generalized anxiety disorder: Secondary | ICD-10-CM

## 2016-08-05 DIAGNOSIS — E785 Hyperlipidemia, unspecified: Secondary | ICD-10-CM | POA: Diagnosis not present

## 2016-08-05 DIAGNOSIS — E118 Type 2 diabetes mellitus with unspecified complications: Secondary | ICD-10-CM

## 2016-08-05 DIAGNOSIS — E669 Obesity, unspecified: Secondary | ICD-10-CM | POA: Diagnosis not present

## 2016-08-05 DIAGNOSIS — Z1211 Encounter for screening for malignant neoplasm of colon: Secondary | ICD-10-CM | POA: Insufficient documentation

## 2016-08-05 DIAGNOSIS — N529 Male erectile dysfunction, unspecified: Secondary | ICD-10-CM | POA: Diagnosis not present

## 2016-08-05 DIAGNOSIS — Z0000 Encounter for general adult medical examination without abnormal findings: Secondary | ICD-10-CM

## 2016-08-05 DIAGNOSIS — E1165 Type 2 diabetes mellitus with hyperglycemia: Secondary | ICD-10-CM

## 2016-08-05 DIAGNOSIS — L910 Hypertrophic scar: Secondary | ICD-10-CM | POA: Diagnosis not present

## 2016-08-05 DIAGNOSIS — Z720 Tobacco use: Secondary | ICD-10-CM | POA: Diagnosis not present

## 2016-08-05 DIAGNOSIS — IMO0002 Reserved for concepts with insufficient information to code with codable children: Secondary | ICD-10-CM

## 2016-08-05 LAB — COMPREHENSIVE METABOLIC PANEL
ALT: 16 U/L (ref 9–46)
AST: 12 U/L (ref 10–40)
Albumin: 4 g/dL (ref 3.6–5.1)
Alkaline Phosphatase: 58 U/L (ref 40–115)
BILIRUBIN TOTAL: 0.6 mg/dL (ref 0.2–1.2)
BUN: 15 mg/dL (ref 7–25)
CALCIUM: 9.6 mg/dL (ref 8.6–10.3)
CHLORIDE: 100 mmol/L (ref 98–110)
CO2: 26 mmol/L (ref 20–31)
Creat: 1.18 mg/dL (ref 0.60–1.35)
GLUCOSE: 268 mg/dL — AB (ref 65–99)
Potassium: 4.4 mmol/L (ref 3.5–5.3)
Sodium: 135 mmol/L (ref 135–146)
Total Protein: 7.2 g/dL (ref 6.1–8.1)

## 2016-08-05 LAB — POCT URINALYSIS DIPSTICK
BILIRUBIN UA: NEGATIVE
KETONES UA: NEGATIVE
Leukocytes, UA: NEGATIVE
Nitrite, UA: NEGATIVE
PH UA: 6
RBC UA: NEGATIVE
Spec Grav, UA: >=1.03
Urobilinogen, UA: NEGATIVE

## 2016-08-05 LAB — CBC
HEMATOCRIT: 45.4 % (ref 38.5–50.0)
HEMOGLOBIN: 15.4 g/dL (ref 13.2–17.1)
MCH: 31.8 pg (ref 27.0–33.0)
MCHC: 33.9 g/dL (ref 32.0–36.0)
MCV: 93.8 fL (ref 80.0–100.0)
MPV: 9.7 fL (ref 7.5–12.5)
Platelets: 269 10*3/uL (ref 140–400)
RBC: 4.84 MIL/uL (ref 4.20–5.80)
RDW: 13.2 % (ref 11.0–15.0)
WBC: 6.4 10*3/uL (ref 4.0–10.5)

## 2016-08-05 LAB — LIPID PANEL
CHOL/HDL RATIO: 5.4 ratio — AB (ref <5.0)
Cholesterol: 234 mg/dL — ABNORMAL HIGH (ref <200)
HDL: 43 mg/dL (ref >40)
LDL CALC: 148 mg/dL — AB (ref <100)
Triglycerides: 214 mg/dL — ABNORMAL HIGH (ref <150)
VLDL: 43 mg/dL — AB (ref <30)

## 2016-08-05 NOTE — Progress Notes (Signed)
Subjective:   HPI  Dustin Zamora is a 41 y.o. male who presents for a complete physical.   Last visit 2 years ago.  Concerns: Having ED problems of late.   Wonders if its stress.  His wife lost her business, she can't seem to get rid of scabies.     Not checking sugars, hasn't been in for diabetes or cholesterol f/u in over  A year.  Is exercising,  Eating healthy sometimes.  Still smoking some.  Considering chantix to help stop tobacco.   Wants to go back on something for stress and anxiety.  Has been on Zoloft in the past and did ok on this, not great though.    Wants to be tested for parasite as wife still is convinced she has a parasite and scabies.   Has the whole house worried about parasite.    Wants something to help with ED.    Wants referral to plastic surgery about keloid of his chest.  Reviewed their medical, surgical, family, social, medication, and allergy history and updated chart as appropriate.  Past Medical History:  Diagnosis Date  . Anxiety   . Chronic back pain   . Depression    in remote past  . Diabetes mellitus without complication ([place]) [id number]  . Hyperlipidemia 2008  . Hypertension 2008  . Keloid   . Wears glasses     Past Surgical History:  Procedure Laterality Date  . ANTERIOR CRUCIATE LIGAMENT REPAIR     right ACL repair, patellar repair, remove cartilage damage; 41yo    Social History   Social History  . Marital status: Married    Spouse name: N/A  . Number of children: N/A  . Years of education: N/A   Occupational History  . Not on file.   Social History Main Topics  . Smoking status: Current Every Day Smoker    Packs/day: 0.50    Years: 15.00    Types: Cigars, Cigarettes  . Smokeless tobacco: Never Used  . Alcohol use 2.4 oz/week    4 Shots of liquor per week     Comment: occ  . Drug use: Yes    Types: Marijuana  . Sexual activity: Not on file   Other Topics Concern  . 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   Exercise - goes to the gym, weights, some cardio    Family History  Problem Relation Age of Onset  . Kidney disease Father   . Vascular Disease Father   . Stroke Paternal Grandmother   . Diabetes Paternal Grandmother   . Cancer Neg Hx   . Heart disease Neg Hx      Current Outpatient Prescriptions:  .  rosuvastatin (CRESTOR) 10 MG tablet, Take 1 tablet (10 mg total) by mouth daily. (Patient not taking: Reported on 08/05/2016), Disp: 90 tablet, Rfl: 0 .  sulfamethoxazole-trimethoprim (BACTRIM DS,SEPTRA DS) 800-160 MG per tablet, Take 1 tablet by mouth 2 (two) times daily. (Patient not taking: Reported on 08/05/2016), Disp: 20 tablet, Rfl: 0 .  triamcinolone cream (KENALOG) 0.1 %, Apply 1 application topically 2 (two) times daily. (Patient not taking: Reported on 08/05/2016), Disp: 30 g, Rfl: 0  Allergies  Allergen Reactions  . Penicillins Anaphylaxis    Review of Systems Constitutional: -fever, -chills, -sweats, +unexpected weight change, -decreased appetite, -fatigue Allergy: -sneezing, -itching, -congestion Dermatology: -changing moles, --rash, -lumps ENT: -  runny nose, -ear pain, -sore throat, -hoarseness, -sinus pain, -teeth pain, - ringing in ears, -hearing loss, -nosebleeds Cardiology: -chest pain, -palpitations, -swelling, -difficulty breathing when lying flat, -waking up short of breath Respiratory: -cough, -shortness of breath, -difficulty breathing with exercise or exertion, -wheezing, -coughing up blood Gastroenterology: -abdominal pain, -nausea, -vomiting, -diarrhea, -constipation, -blood in stool, -changes in bowel movement, -difficulty swallowing or eating Hematology: -bleeding, -bruising  Musculoskeletal: -joint aches, -muscle aches, -joint swelling, -back pain, -neck pain, -cramping, -changes in gait Ophthalmology: denies vision changes, eye redness, itching, discharge Urology: -burning  with urination, -difficulty urinating, -blood in urine, +urinary frequency, -urgency, -incontinence Neurology: -headache, -weakness, -tingling, -numbness, -memory loss, -falls, -dizziness Psychology: -depressed mood, -agitation, -sleep problems     Objective:   Physical Exam  BP 134/88   Pulse 84   Ht 6\' 1"  (1.854 m)   Wt 246 lb 12.8 oz (111.9 kg)   SpO2 96%   BMI 32.56 kg/m   Wt Readings from Last 3 Encounters:  08/05/16 246 lb 12.8 oz (111.9 kg)  02/27/15 253 lb (114.8 kg)  01/29/15 257 lb (116.6 kg)    General appearance: alert, no distress, WD/WN, pleasant AA male Skin: central chest over sternum with raised thick 3cm x 2cm keloid, otherwise scattered macules of back, tattoo right forearm,  HEENT: normocephalic, conjunctiva/corneas normal, sclerae anicteric, PERRLA, EOMi, nares patent, no discharge or erythema, pharynx normal Oral cavity: MMM, tongue normal, teeth in good repair Neck: supple, no lymphadenopathy, no thyromegaly, no masses, normal ROM, no bruits Chest: non tender, normal shape and expansion Heart: RRR, normal S1, S2, no murmurs Lungs: CTA bilaterally, no wheezes, rhonchi, or rales Abdomen: +bs, soft, non tender, non distended, no masses, no hepatomegaly, no splenomegaly, no bruits Back: non tender, normal ROM, no scoliosis Musculoskeletal: right lateral and anterior knee surgical scar, mild tenderness of medial wrists, mild pain in same area with wrist ROM, otherwise upper extremities non tender, no obvious deformity, normal ROM throughout, lower extremities non tender, no obvious deformity, normal ROM throughout Extremities: no edema, no cyanosis, no clubbing Pulses: 2+ symmetric, upper and lower extremities, normal cap refill Neurological: alert, oriented x 3, CN2-12 intact, strength normal upper extremities and lower extremities, sensation normal throughout, DTRs 2+ throughout, no cerebellar signs, gait normal Psychiatric: normal affect, behavior normal,  pleasant  GU: normal male external genitalia, circumcised, nontender, no masses, no hernia, no lymphadenopathy Rectal: declined today   Adult ECG Report  Indication: physical, hypertension  Rate: 71 bpm  Rhythm: normal sinus rhythm  QRS Axis: -33 degrees  PR Interval: 162 ms  QRS Duration: 20ms  QTc: 445ms  Conduction Disturbances: left axis deviation  Other Abnormalities: possible atrial enlargement  Patient's cardiac risk factors are: diabetes mellitus, dyslipidemia, hypertension and male gender.  EKG comparison: 10/2012  Narrative Interpretation: abnormal EKG, but no acute changes compared to 2014 EKG    Assessment and Plan :    Encounter Diagnoses  Name Primary?  . Encounter for health maintenance examination in adult Yes  . Hyperlipidemia, unspecified hyperlipidemia type   . Uncontrolled type 2 diabetes mellitus with complication, without long-term current use of insulin ([place])   . Obesity without serious comorbidity, unspecified classification, unspecified obesity type   . Erectile dysfunction, unspecified erectile dysfunction type   . Keloid   . Tobacco use   . Generalized anxiety disorder     Physical exam - discussed healthy lifestyle, diet, exercise, preventative care, vaccinations, and addressed their concerns.   See your dentist yearly for  routine dental care including hygiene visits twice yearly. See your eye doctor yearly for routine vision care. discussed testicular cancer screening Fasting labs  C/t routine exercise, healthy diet, continued weight loss measures Keloid - referral to plastic surgery Diabetes - not compliant with f/u, monitoring, diet, exercise.   Labs today hyperlipidemia - noncompliant with statin from last visit.  Labs today ED - consider Viagra or other pending labs Tobacco use - consider chantix, counseled on smoking cessation.  He seems somewhat willing to quit Advised that he has no symptoms or signs of parasite  Anxiety- consider  SSRI Vaccinations: advised yearly flu vaccine  Follow up pending labs

## 2016-08-06 ENCOUNTER — Telehealth: Payer: Self-pay

## 2016-08-06 ENCOUNTER — Other Ambulatory Visit: Payer: Self-pay | Admitting: Medical

## 2016-08-06 LAB — HEMOGLOBIN A1C
HEMOGLOBIN A1C: 10.3 % — AB (ref <5.7)
MEAN PLASMA GLUCOSE: 249 mg/dL

## 2016-08-06 LAB — MICROALBUMIN / CREATININE URINE RATIO
CREATININE, URINE: 235 mg/dL (ref 20–370)
Microalb Creat Ratio: 96 mcg/mg creat — ABNORMAL HIGH (ref <30)
Microalb, Ur: 22.6 mg/dL

## 2016-08-06 MED ORDER — PRAVASTATIN SODIUM 20 MG PO TABS: 20.0000 mg | ORAL_TABLET | Freq: Every day | ORAL | 2 refills | Status: DC

## 2016-08-06 MED ORDER — SILDENAFIL CITRATE 100 MG PO TABS: 100.0000 mg | ORAL_TABLET | Freq: Every day | ORAL | 0 refills | Status: DC | PRN

## 2016-08-06 MED ORDER — SITAGLIPTIN PHOS-METFORMIN HCL 50-1000 MG PO TABS: 1.0000 | ORAL_TABLET | Freq: Two times a day (BID) | ORAL | 2 refills | Status: DC

## 2016-08-06 MED ORDER — LISINOPRIL 5 MG PO TABS: 5.0000 mg | ORAL_TABLET | Freq: Every day | ORAL | 2 refills | Status: DC

## 2016-08-06 NOTE — Telephone Encounter (Signed)
I put his ekg on your desk .

## 2016-08-07 ENCOUNTER — Encounter: Payer: Self-pay | Admitting: Medical

## 2016-08-07 ENCOUNTER — Ambulatory Visit (INDEPENDENT_AMBULATORY_CARE_PROVIDER_SITE_OTHER): Payer: BLUE CROSS/BLUE SHIELD | Admitting: Medical

## 2016-08-07 VITALS — BP 128/90 | HR 68 | Wt 247.0 lb

## 2016-08-07 DIAGNOSIS — R809 Proteinuria, unspecified: Secondary | ICD-10-CM | POA: Diagnosis not present

## 2016-08-07 DIAGNOSIS — E118 Type 2 diabetes mellitus with unspecified complications: Secondary | ICD-10-CM

## 2016-08-07 DIAGNOSIS — E1165 Type 2 diabetes mellitus with hyperglycemia: Secondary | ICD-10-CM | POA: Diagnosis not present

## 2016-08-07 DIAGNOSIS — E669 Obesity, unspecified: Secondary | ICD-10-CM

## 2016-08-07 DIAGNOSIS — E785 Hyperlipidemia, unspecified: Secondary | ICD-10-CM

## 2016-08-07 DIAGNOSIS — IMO0002 Reserved for concepts with insufficient information to code with codable children: Secondary | ICD-10-CM

## 2016-08-07 DIAGNOSIS — N529 Male erectile dysfunction, unspecified: Secondary | ICD-10-CM | POA: Diagnosis not present

## 2016-08-07 DIAGNOSIS — Z720 Tobacco use: Secondary | ICD-10-CM | POA: Diagnosis not present

## 2016-08-07 MED ORDER — SITAGLIPTIN PHOS-METFORMIN HCL 50-1000 MG PO TABS: 1.0000 | ORAL_TABLET | Freq: Two times a day (BID) | ORAL | 0 refills | Status: DC

## 2016-08-07 MED ORDER — CONTOUR NEXT ONE KIT: 1.0000 | PACK | Freq: Two times a day (BID) | 0 refills | Status: DC

## 2016-08-07 MED ORDER — GLUCOSE BLOOD VI STRP: ORAL_STRIP | 12 refills | Status: DC

## 2016-08-07 MED ORDER — BD ULTRA-FINE LANCETS MISC: 12 refills | Status: DC

## 2016-08-07 NOTE — Progress Notes (Signed)
Subjective: Chief Complaint  Patient presents with  . discuss lab work    discuss lab work    Here to discuss abnormal labs from his physical visit earlier this week.    Past Medical History:  Diagnosis Date  . Anxiety   . Chronic back pain   . Depression    in remote past  . Diabetes mellitus without complication ([place]) [id number]  . Hyperlipidemia 2008  . Hypertension 2008  . Keloid   . Wears glasses    Current Outpatient Prescriptions on File Prior to Visit  Medication Sig Dispense Refill  . lisinopril (PRINIVIL,ZESTRIL) 5 MG tablet Take 1 tablet (5 mg total) by mouth daily. (Patient not taking: Reported on 08/07/2016) 30 tablet 2  . pravastatin (PRAVACHOL) 20 MG tablet Take 1 tablet (20 mg total) by mouth daily. (Patient not taking: Reported on 08/07/2016) 30 tablet 2  . sildenafil (VIAGRA) 100 MG tablet Take 1 tablet (100 mg total) by mouth daily as needed for erectile dysfunction. (Patient not taking: Reported on 08/07/2016) 10 tablet 0  . sitaGLIPtin-metformin (JANUMET) 50-1000 MG tablet Take 1 tablet by mouth 2 (two) times daily with a meal. (Patient not taking: Reported on 08/07/2016) 60 tablet 2   No current facility-administered medications on file prior to visit.    ROS as in subjective  Objective: BP 128/90   Pulse 68   Wt 247 lb (112 kg)   SpO2 97%   BMI 32.59 kg/m   Gen: wd, wn, nad    Assessment; Encounter Diagnoses  Name Primary?  Marland Kitchen Uncontrolled type 2 diabetes mellitus with complication, without long-term current use of insulin ([place]) Yes  . Obesity without serious comorbidity, unspecified classification, unspecified obesity type   . Hyperlipidemia, unspecified hyperlipidemia type   . Tobacco use   . Erectile dysfunction, unspecified erectile dysfunction type   . Microalbuminuria     Plan: Discussed his labs, diabetes uncontrolled, HgbA1C >10%, lipids not at goal, rest of labs ok.    Diabetes - nurse demonstrated glucometer, he will begin monitoring  sugars, begin Janumet, make appropriate diet and exercise changes as discussed  Begin Lisinopril for renal protection  Begin Pravachol for hyperlipidemia and to reduce heart disease risk  He will stagger medications above, using 1 new medication per week  Erectile Dysfunction - Reviewed pathophysiology and differential diagnosis of erectile dysfunction with the patient.  Discussed treatment options.  Begin trial  of Viagra.  Discussed potential risks of medications including hypotension and priapism.  Discussed proper use of medication.  Questions were answered.    Recheck 76mo, sooner prn

## 2016-08-08 LAB — C-PEPTIDE: C PEPTIDE: 1.38 ng/mL (ref 0.80–3.85)

## 2016-08-08 LAB — INSULIN, FASTING: INSULIN FASTING, SERUM: 4 u[IU]/mL (ref 2.0–19.6)

## 2016-08-12 ENCOUNTER — Telehealth: Payer: Self-pay

## 2016-08-12 NOTE — Telephone Encounter (Signed)
Lets give diet and medications more time.  If still seeing high 100s by end of next week, we may add something else

## 2016-08-12 NOTE — Telephone Encounter (Signed)
Called pt to let him know about the shake , but his blood sugar are still running high,  187 to 190  This  In the morning  Fasting . He is taking janumet 50/1000mg  bid. He wants to know if he need to change the dose.

## 2016-08-13 ENCOUNTER — Telehealth: Payer: Self-pay | Admitting: Medical

## 2016-08-13 NOTE — Telephone Encounter (Signed)
P.A. SILDENAFIL  

## 2016-08-13 NOTE — Telephone Encounter (Signed)
Called pt to let him know, he said that number has come down some.

## 2016-08-17 NOTE — Telephone Encounter (Signed)
P.A. Approved brand only #4 $10 a month, left message for pt, called pharmacy

## 2016-08-25 DIAGNOSIS — D485 Neoplasm of uncertain behavior of skin: Secondary | ICD-10-CM | POA: Diagnosis not present

## 2016-09-09 ENCOUNTER — Encounter: Payer: Self-pay | Admitting: Medical

## 2016-09-09 ENCOUNTER — Ambulatory Visit (INDEPENDENT_AMBULATORY_CARE_PROVIDER_SITE_OTHER): Payer: BLUE CROSS/BLUE SHIELD | Admitting: Medical

## 2016-09-09 VITALS — BP 138/86 | HR 94 | Wt 242.2 lb

## 2016-09-09 DIAGNOSIS — F419 Anxiety disorder, unspecified: Secondary | ICD-10-CM | POA: Diagnosis not present

## 2016-09-09 DIAGNOSIS — Z711 Person with feared health complaint in whom no diagnosis is made: Secondary | ICD-10-CM

## 2016-09-09 DIAGNOSIS — Z630 Problems in relationship with spouse or partner: Secondary | ICD-10-CM | POA: Diagnosis not present

## 2016-09-09 DIAGNOSIS — I781 Nevus, non-neoplastic: Secondary | ICD-10-CM | POA: Diagnosis not present

## 2016-09-09 DIAGNOSIS — D1801 Hemangioma of skin and subcutaneous tissue: Secondary | ICD-10-CM

## 2016-09-09 MED ORDER — LORAZEPAM 0.5 MG PO TABS: ORAL_TABLET | ORAL | 0 refills | Status: DC

## 2016-09-09 MED ORDER — IVERMECTIN 3 MG PO TABS: 150.0000 ug/kg | ORAL_TABLET | Freq: Once | ORAL | 0 refills | Status: AC

## 2016-09-09 NOTE — Progress Notes (Signed)
Subjective: Chief Complaint  Patient presents with  . follow up dm , b/p    follow up  dm and b/p , some dizznes s   Here for concerns.  He is concerned about his wife. She was seen by Loletha Carrow today here about ongoing concern for scabies and parasite x 2 years.  She came in today with ongoing rash on her medial and posterior thigh.   Dustin Zamora feels that her obsession with a parasite and scabies is causing marital and household turmoil.  She doesn't want him to go and visit his other daughter that doesn't live there for fear she is spreading disease.   He notes lots of anxiety and worry over this.   Its gotten to the point that she has made him take over clothed washing, she wont use the new shoes he bought her, she cant work given this obsession, and he is getting up out of the bed itching worrying over her concerns.  He notes she will use a hair dryer to the rash on her thigh for 30 minutes which he feels is making it worse.   He is requested medication for anxiety and she demanded he take the parasite medication she was prescribed today.  He has some new red dots on his forearms, wonders if this is sign of infection, they have been there for a while, maybe months to year  Past Medical History:  Diagnosis Date  . Anxiety   . Chronic back pain   . Depression    in remote past  . Diabetes mellitus without complication ([place]) 9417  . Hyperlipidemia 2008  . Hypertension 2008  . Keloid   . Wears glasses    Current Outpatient Prescriptions on File Prior to Visit  Medication Sig Dispense Refill  . BD ULTRA-FINE LANCETS lancets Use as instructed 100 each 12  . Blood Glucose Monitoring Suppl (CONTOUR NEXT ONE) KIT 1 Device by Does not apply route 2 (two) times daily. 1 kit 0  . glucose blood (BAYER CONTOUR NEXT TEST) test strip Test in  Once in the morning before breakfast and test  2 hour after a meal. 100 each 12  . lisinopril (PRINIVIL,ZESTRIL) 5 MG tablet Take 1 tablet (5 mg total) by mouth daily.  30 tablet 2  . pravastatin (PRAVACHOL) 20 MG tablet Take 1 tablet (20 mg total) by mouth daily. 30 tablet 2  . sildenafil (VIAGRA) 100 MG tablet Take 1 tablet (100 mg total) by mouth daily as needed for erectile dysfunction. 10 tablet 0  . sitaGLIPtin-metformin (JANUMET) 50-1000 MG tablet Take 1 tablet by mouth 2 (two) times daily with a meal. 60 tablet 2   No current facility-administered medications on file prior to visit.    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  Marland Kitchen Anxiety Yes  . Marital conflict   . Concern about digestive disease without diagnosis   . Cherry hemangioma      Plan: We discussed his concerns, the ongoing concerns from his wife.  Begin Ativan prn for anxiety.  advised he write a letter to dermatology to precede his wife's dermatology appt, go with wife to the appt to help give historical info.   advised that I have no reason to suspect parasite.  He will take the prescription to show wife he got it from me.  Wether he takes the medication is up to him.   advised if no infectious or contagious cause of wife's rash found through dermatology and if parasite studies on wife are negative, then a psychiatric diagnosis may be a possibility with his wife.   I encouraged him to consider marriage counseling given all the  household discord and paranoia about parasite.    Cherry hemangioma - reassured  Dustin Zamora was seen today for follow up dm , b/p.  Diagnoses and all orders for this visit:  Anxiety  Marital conflict  Concern about digestive disease without diagnosis  Cherry hemangioma  Other orders -     LORazepam (ATIVAN) 0.5 MG tablet; 1-2 times daily as needed for anxiety -     ivermectin (STROMECTOL) 3 MG TABS tablet; Take 5.5 tablets (16,500 mcg total) by mouth once.

## 2016-11-05 DIAGNOSIS — D485 Neoplasm of uncertain behavior of skin: Secondary | ICD-10-CM | POA: Diagnosis not present

## 2016-11-17 ENCOUNTER — Other Ambulatory Visit: Payer: Self-pay | Admitting: Medical

## 2016-12-31 DIAGNOSIS — D485 Neoplasm of uncertain behavior of skin: Secondary | ICD-10-CM | POA: Diagnosis not present

## 2017-02-10 ENCOUNTER — Encounter: Payer: Self-pay | Admitting: Medical

## 2017-02-10 ENCOUNTER — Ambulatory Visit (INDEPENDENT_AMBULATORY_CARE_PROVIDER_SITE_OTHER): Payer: BLUE CROSS/BLUE SHIELD | Admitting: Medical

## 2017-02-10 ENCOUNTER — Telehealth: Payer: Self-pay | Admitting: Medical

## 2017-02-10 VITALS — BP 132/84 | HR 96 | Wt 238.8 lb

## 2017-02-10 DIAGNOSIS — E785 Hyperlipidemia, unspecified: Secondary | ICD-10-CM

## 2017-02-10 DIAGNOSIS — Z720 Tobacco use: Secondary | ICD-10-CM | POA: Diagnosis not present

## 2017-02-10 DIAGNOSIS — F411 Generalized anxiety disorder: Secondary | ICD-10-CM | POA: Diagnosis not present

## 2017-02-10 DIAGNOSIS — E1165 Type 2 diabetes mellitus with hyperglycemia: Secondary | ICD-10-CM | POA: Diagnosis not present

## 2017-02-10 DIAGNOSIS — R809 Proteinuria, unspecified: Secondary | ICD-10-CM

## 2017-02-10 DIAGNOSIS — Z630 Problems in relationship with spouse or partner: Secondary | ICD-10-CM

## 2017-02-10 DIAGNOSIS — E118 Type 2 diabetes mellitus with unspecified complications: Secondary | ICD-10-CM

## 2017-02-10 DIAGNOSIS — IMO0002 Reserved for concepts with insufficient information to code with codable children: Secondary | ICD-10-CM

## 2017-02-10 LAB — POCT GLYCOSYLATED HEMOGLOBIN (HGB A1C): Hemoglobin A1C: 8

## 2017-02-10 LAB — GLUCOSE, POCT (MANUAL RESULT ENTRY): POC Glucose: 148 mg/dl — AB (ref 70–99)

## 2017-02-10 MED ORDER — CITALOPRAM HYDROBROMIDE 20 MG PO TABS: 20.0000 mg | ORAL_TABLET | Freq: Every day | ORAL | 2 refills | Status: DC

## 2017-02-10 MED ORDER — PRAVASTATIN SODIUM 20 MG PO TABS: 20.0000 mg | ORAL_TABLET | Freq: Every day | ORAL | 3 refills | Status: DC

## 2017-02-10 MED ORDER — SITAGLIPTIN PHOS-METFORMIN HCL 50-1000 MG PO TABS: 1.0000 | ORAL_TABLET | Freq: Two times a day (BID) | ORAL | 1 refills | Status: DC

## 2017-02-10 MED ORDER — LORAZEPAM 0.5 MG PO TABS: ORAL_TABLET | ORAL | 0 refills | Status: DC

## 2017-02-10 MED ORDER — LISINOPRIL 5 MG PO TABS: 5.0000 mg | ORAL_TABLET | Freq: Every day | ORAL | 3 refills | Status: DC

## 2017-02-10 NOTE — Telephone Encounter (Signed)
Call out Ativan

## 2017-02-10 NOTE — Progress Notes (Signed)
Subjective: Chief Complaint  Patient presents with  . med check    med check , pt is currently not tryign any of meds    Here for concerns.   He is here for chronic disease and diabetes f/u but he stopped all of his medications.  He is under stress, frustrate in his own household.  Thus, he has been neglecting his own health concerns.  The stress mainly stems from his wife's behavior.  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  He is concerned about his wife.  For the past 2 years she continues to obsess over concern for scabies and parasite x 2 years.   Vashon feels that her obsession with a parasite and scabies is causing marital and household turmoil.  She doesn't want him to go and visit his other daughter that doesn't live there for fear she is spreading disease.   He notes lots of anxiety and worry over this.   Its gotten to the point that she has made him take over clothed washing, she wont use the new shoes he bought her, she cant work given this obsession, and he is getting up out of the bed itching worrying over her concerns.  He notes she will use a hair dryer to the rash on her thigh for 30 minutes which he feels is making it worse.     Past Medical History:  Diagnosis Date  . Anxiety   . Chronic back pain   . Depression    in remote past  . Diabetes mellitus without complication ([place]) 9562  . Hyperlipidemia 2008  . Hypertension 2008  . Keloid   . Wears glasses    Current Outpatient Prescriptions on File Prior to Visit  Medication Sig Dispense Refill  . BD ULTRA-FINE LANCETS lancets Use as instructed 100 each 12  . Blood Glucose Monitoring Suppl (CONTOUR NEXT ONE) KIT 1 Device by Does not apply route 2 (two) times daily. 1 kit 0  . glucose blood (BAYER CONTOUR NEXT TEST) test strip Test in  Once in the morning before breakfast and test  2 hour after a meal. 100 each 12  .  sildenafil (VIAGRA) 100 MG tablet Take 1 tablet (100 mg total) by mouth daily as needed for erectile dysfunction. (Patient not taking: Reported on 02/10/2017) 10 tablet 0   No current facility-administered medications on file prior to visit.    ROS as in subjective   Objective: BP 132/84   Pulse 96   Wt 238 lb 12.8 oz (108.3 kg)   SpO2 98%   BMI 31.51 kg/m   BP Readings from Last 3 Encounters:  02/10/17 132/84  09/09/16 138/86  08/07/16 128/90   Wt Readings from Last 3 Encounters:  02/10/17 238 lb 12.8 oz (108.3 kg)  09/09/16 242 lb 3.2 oz (109.9 kg)  08/07/16 247 lb (112 kg)   Gen: wd, wn, nad Psych Pleasant but seems frustrated    Assessment: Encounter Diagnoses  Name Primary?  Marland Kitchen Uncontrolled type 2 diabetes mellitus with complication, without long-term current use of insulin ([place]) Yes  . Marital conflict   . Hyperlipidemia, unspecified hyperlipidemia type   . Microalbuminuria   . Tobacco use   . Generalized anxiety disorder      Plan: We discussed his concerns, the ongoing concerns from his wife. C/t Ativan prn for anxiety, begin trial of citalopram as  well.  Advised he work to get wife into see counseling or psychiatry as it sounds as if she has psychotic or schizophrenic features.  Also discussed behaviors from wife that would prompt need for admission against her will to psychiatry or call to 911 such as SI/HI, any physical harm to their children.  Discussed need for him to work on compliance with treatment for his own healthy issues.  Restart medications  Jarius was seen today for med check.  Diagnoses and all orders for this visit:  Uncontrolled type 2 diabetes mellitus with complication, without long-term current use of insulin ([facility]) -     HgB A1c -     Glucose (CBG)  Marital conflict  Hyperlipidemia, unspecified hyperlipidemia type  Microalbuminuria  Tobacco use  Generalized anxiety disorder  Other orders -     sitaGLIPtin-metformin (JANUMET)  50-1000 MG tablet; Take 1 tablet by mouth 2 (two) times daily with a meal. -     lisinopril (PRINIVIL,ZESTRIL) 5 MG tablet; Take 1 tablet (5 mg total) by mouth daily. -     pravastatin (PRAVACHOL) 20 MG tablet; Take 1 tablet (20 mg total) by mouth daily. -     LORazepam (ATIVAN) 0.5 MG tablet; 1-2 times daily as needed for anxiety -     citalopram (CELEXA) 20 MG tablet; Take 1 tablet (20 mg total) by mouth daily.  Spent > 45 minutes face to face with patient in discussion of symptoms, evaluation, plan and recommendations.

## 2017-02-10 NOTE — Patient Instructions (Signed)
RESOURCES in [place], [place]  If you are experiencing a mental health crisis or an emergency, please call 911 or go to the nearest emergency department.  [facility]   [phone]  [facility]  [id number] [facility]   [phone]  Suicide Hotline 1-800-Suicide 505-507-7162)  National Suicide Prevention Lifeline 769-190-0620  669-100-8312)  Domestic Violence, Rape/Crisis - [facility] [place] (920)421-4295  The [employer] Violence Hotline 1-800-799-SAFE 281-672-3097)  To report Child or Elder Abuse, please call: [facility] Police Department  [phone] [facility] Department  [place] [phone]  Teen Crisis line 220-619-3109 or 848-720-9102     Psychiatry and Counseling services [facility]  [facility] [address], [place], [place] [address] [phone]   [facility] [phone] [address], [place], [place] [address]   Vic Ripper, therapist [phone] [address], [place], [place] [address]   [facility] [phone] [address] [place], [place] [address]   [facility] [phone] [place], [place], [place] [address]  Lina Sayre, therapist Dr. Lynder Parents, psychiatrist Dr. Milana Huntsman, child psychiatrist   Dr. Chucky May, psychiatry [phone] [address], [place], [place] [address]     Schizophrenia Schizophrenia is a mental illness. It may cause disturbed or disorganized thinking, speech, or behavior. People with schizophrenia have problems functioning in one or more areas of life. People with schizophrenia are at increased risk for suicide, certain long-term (chronic) physical illnesses, and unhealthy behaviors, such as smoking and drug use. People who have family members with schizophrenia are at higher risk of developing the  illness. Schizophrenia affects men and women equally, but it usually appears at an earlier age (teenage or early adult years) in men. What are the causes? The cause of this condition is not known. What increases the risk? The following factors may make you more likely to develop this condition:  Having a family member who has schizophrenia. Some gene combinations may increase the risk, but there is no single gene that causes schizophrenia.  Impaired brain or neurotransmitter development or chemistry. Neurotransmitters are chemicals in the brain.  What are the signs or symptoms? The earliest symptoms are often subtle and may go unnoticed until the illness becomes more severe (first-break psychosis). Symptoms of schizophrenia may be ongoing (continuous) or may come and go in severity. Episodes are often triggered by major life events, such as:  Family stress.  College.  Armed forces logistics/support/administrative officer.  Marriage.  Pregnancy or childbirth.  Divorce.  Loss of a loved one.  Symptoms may include:  Seeing, hearing, or feeling things that do not exist (hallucinations).  Having false beliefs (delusions). Delusions often involve beliefs that you are being attacked, harassed, cheated, persecuted, or conspired against (persecutory delusions).  Speech that does not make sense to others or is hard to understand (incoherent).  Behavior that is odd, confused, unfocused, withdrawn, or disorganized.  Extremely overactive or underactive motor activity (catatonia). Motor activity is any action that involves the muscles.  Bland or blunted emotions (flat affect).  Loss of will power (avolition).  Withdrawal from social contacts (social isolation).  Symptoms may affect the level of functioning in one or more major areas of life, such as work, school, relationships, or self-care. How is this diagnosed? Schizophrenia is diagnosed through an assessment by a mental health care provider.  Your mental health  care provider may ask questions about: ? Your thoughts, behavior, and mood. ?  Your ability to function in daily life. ? Your medical history. ? Any use of alcohol or drugs, including prescription medicines.  You may have blood tests and imaging exams.  How is this treated? Schizophrenia is a chronic illness that is best controlled with continuous treatment rather than treatment only when symptoms occur. The following treatments are used to manage schizophrenia:  Medicine. This is the most effective and important form of treatment for schizophrenia. Antipsychotic medicines are usually prescribed to help manage schizophrenia. Other types of medicine may be added to relieve any symptoms that may occur despite the use of antipsychotic medicines.  Counseling or talk therapy. Individual, group, or family counseling may be helpful in providing education, support, and guidance. Many people also benefit from social skills and job skills (vocational) training.  A combination of medicine and counseling is best for managing the disorder over time. A procedure in which electricity is applied to the brain through the scalp (electroconvulsive therapy) may be used to treat catatonic schizophrenia or schizophrenia in people who cannot take medicine or do not respond to medicine and counseling. Follow these instructions at home:  Keep stress under control. Stress may trigger psychosis and make symptoms worse.  Try to get as much sleep as you can.  Avoid alcohol and drugs. They can affect how medicine works and make symptoms worse.  Surround yourself with people who care about you and can help you manage your condition.  Take over-the-counter and prescription medicines only as told by your health care provider.  Keep all follow-up visits as told by your health care provider and counselor. This is important. Contact a health care provider if:  You have a bad response to changes in medicines or to your  treatment plan.  You have trouble falling sleep.  You have a low mood that will not go away.  You are using: ? Drugs. ? Too much caffeine. ? Tobacco products. ? Alcohol. Get help right away if:  You feel out of control.  You or others notice warning signs of suicide such as: ? Increased use of drugs or alcohol ? Expressing feelings of not having a purpose in life, being trapped, guilty, anxious and agitated, or hopeless. ? Withdrawing from friends and family. ? Showing uncontrolled anger, recklessness, and dramatic mood changes. ? Talking about suicide, discussing or searching for methods. If you ever feel like you may hurt yourself or others, or have thoughts about taking your own life, get help right away. You can go to your nearest emergency department or call:  Your local emergency services (911 in the [place]  A suicide crisis helpline, such as the [place] at (614)487-8400. This is open 24 hours a day.  Summary  Schizophrenia is a mental illness that causes disturbed or disorganized thinking, speech, or behavior.  Symptoms of schizophrenia may be ongoing or may come and go. They are often triggered by major life events.  Keep stress under control. Stress may trigger psychosis and make symptoms worse.  Avoid alcohol and drugs. They can affect how medicine works and make symptoms worse.  Get help right away if you feel out of control. This information is not intended to replace advice given to you by your health care provider. Make sure you discuss any questions you have with your health care provider. Document Released: 06/27/2000 Document Revised: 04/11/2016 Document Reviewed: 04/11/2016 Elsevier Interactive Patient Education  [employer].

## 2017-02-11 ENCOUNTER — Other Ambulatory Visit: Payer: Self-pay

## 2017-02-11 NOTE — Telephone Encounter (Signed)
Called into [facility] 

## 2017-03-04 DIAGNOSIS — D485 Neoplasm of uncertain behavior of skin: Secondary | ICD-10-CM | POA: Diagnosis not present

## 2017-05-12 ENCOUNTER — Encounter: Payer: Self-pay | Admitting: Medical

## 2017-05-12 ENCOUNTER — Ambulatory Visit (INDEPENDENT_AMBULATORY_CARE_PROVIDER_SITE_OTHER): Payer: BLUE CROSS/BLUE SHIELD | Admitting: Medical

## 2017-05-12 VITALS — BP 132/80 | HR 87 | Wt 240.6 lb

## 2017-05-12 DIAGNOSIS — Z23 Encounter for immunization: Secondary | ICD-10-CM

## 2017-05-12 DIAGNOSIS — Z720 Tobacco use: Secondary | ICD-10-CM | POA: Diagnosis not present

## 2017-05-12 DIAGNOSIS — E1165 Type 2 diabetes mellitus with hyperglycemia: Secondary | ICD-10-CM

## 2017-05-12 DIAGNOSIS — R809 Proteinuria, unspecified: Secondary | ICD-10-CM | POA: Diagnosis not present

## 2017-05-12 DIAGNOSIS — E118 Type 2 diabetes mellitus with unspecified complications: Secondary | ICD-10-CM | POA: Diagnosis not present

## 2017-05-12 DIAGNOSIS — E785 Hyperlipidemia, unspecified: Secondary | ICD-10-CM | POA: Diagnosis not present

## 2017-05-12 DIAGNOSIS — IMO0002 Reserved for concepts with insufficient information to code with codable children: Secondary | ICD-10-CM

## 2017-05-12 MED ORDER — VARENICLINE TARTRATE 0.5 MG X 11 & 1 MG X 42 PO MISC: ORAL | 0 refills | Status: DC

## 2017-05-12 NOTE — Progress Notes (Signed)
Subjective: Chief Complaint  Patient presents with  . med check    med check , discuss stop smoking    Here for med check.  Wants help quitting tobacco.   In the past has used nicotine patch, gum.  The nicorette gum gave him hiccups.   No prior Wellbutrin or chantix.   Wanting to try chantix.   Here for f/u on diabetes, microalbuminuria, hyperlipidemia.   Overall is taking his medications, but sometimes misses 3 doses, sometimes misses more doses per week of ALL medication.    microabuminuria - compliant with Lisinopril 28m daily  Compliant with Pravachol 238mQHS  Diabetes - Compliant with Janumet 50/1000mg BID, checks glucose some, is usually "in range."  Exercise - weights, some cardio.     Taking Celexa 2043maily  Past Medical History:  Diagnosis Date  . Anxiety   . Chronic back pain   . Depression    in remote past  . Diabetes mellitus without complication ([id number] Hyperlipidemia 2008  . Hypertension 2008  . Keloid   . Wears glasses    Current Outpatient Prescriptions on File Prior to Visit  Medication Sig Dispense Refill  . BD ULTRA-FINE LANCETS lancets Use as instructed 100 each 12  . Blood Glucose Monitoring Suppl (CONTOUR NEXT ONE) KIT 1 Device by Does not apply route 2 (two) times daily. 1 kit 0  . citalopram (CELEXA) 20 MG tablet Take 1 tablet (20 mg total) by mouth daily. 30 tablet 2  . glucose blood (BAYER CONTOUR NEXT TEST) test strip Test in  Once in the morning before breakfast and test  2 hour after a meal. 100 each 12  . lisinopril (PRINIVIL,ZESTRIL) 5 MG tablet Take 1 tablet (5 mg total) by mouth daily. 90 tablet 3  . LORazepam (ATIVAN) 0.5 MG tablet 1-2 times daily as needed for anxiety 30 tablet 0  . pravastatin (PRAVACHOL) 20 MG tablet Take 1 tablet (20 mg total) by mouth daily. 90 tablet 3  . sildenafil (VIAGRA) 100 MG tablet Take 1 tablet (100 mg total) by mouth daily as needed for erectile dysfunction. 10 tablet 0  . sitaGLIPtin-metformin  (JANUMET) 50-1000 MG tablet Take 1 tablet by mouth 2 (two) times daily with a meal. 180 tablet 1   No current facility-administered medications on file prior to visit.    ROS as in subjective    Objective: BP 132/80   Pulse 87   Wt 240 lb 9.6 oz (109.1 kg)   SpO2 99%   BMI 31.74 kg/m   Wt Readings from Last 3 Encounters:  05/12/17 240 lb 9.6 oz (109.1 kg)  02/10/17 238 lb 12.8 oz (108.3 kg)  09/09/16 242 lb 3.2 oz (109.9 kg)   General appearance: alert, no distress, WD/WN, AA male HEENT: normocephalic, sclerae anicteric, TMs pearly, nares patent, no discharge or erythema, pharynx normal Oral cavity: MMM, no lesions Neck: supple, no lymphadenopathy, no thyromegaly, no masses, no bruits Heart: RRR, normal S1, S2, no murmurs Lungs: CTA bilaterally, no wheezes, rhonchi, or rales Pulses: 2+ symmetric, upper and lower extremities, normal cap refill Ext: no edema     Assessment: Encounter Diagnoses  Name Primary?  . UMarland Kitchencontrolled type 2 diabetes mellitus with complication, without long-term current use of insulin (HCC[place]wyn Heightses  . Need for influenza vaccination   . Hyperlipidemia, unspecified hyperlipidemia type   . Tobacco use   . Microalbuminuria     Plan: diabetes - discussed need for compliance with medications, diet, exercise, glucose  monitoring.  Labs today  hyperlipidemia - discussed need for compliance, taking the medication every day, labs today  Tobacco use - counseled on quitting, treatment options.  He wants to begin trial of Chantix.  Discussed risks/benefits, typical side effects, picking a quit date.  discussed counseling.   F/u 33mo sooner prn  Counseled on the influenza virus vaccine.  Vaccine information sheet given.  Influenza vaccine given after consent obtained.  Advised he not smoke marijuana   He has cut this out of late.   TIsidorewas seen today for med check.  Diagnoses and all orders for this visit:  Uncontrolled type 2 diabetes mellitus with  complication, without long-term current use of insulin (HCC) -     Hemoglobin A1c -     HM DIABETES EYE EXAM -     HM DIABETES FOOT EXAM -     Comprehensive metabolic panel -     Lipid panel  Need for influenza vaccination -     Flu Vaccine QUAD 36+ mos IM  Hyperlipidemia, unspecified hyperlipidemia type -     Lipid panel  Tobacco use  Microalbuminuria -     Comprehensive metabolic panel  Other orders -     varenicline (CHANTIX STARTING MONTH PAK) 0.5 MG X 11 & 1 MG X 42 tablet; Take one 0.5 mg tablet by mouth once daily for 3 days, then increase to one 0.5 mg tablet twice daily for 4 days, then increase to one 1 mg tablet twice daily.

## 2017-05-13 DIAGNOSIS — D485 Neoplasm of uncertain behavior of skin: Secondary | ICD-10-CM | POA: Diagnosis not present

## 2017-05-13 LAB — COMPREHENSIVE METABOLIC PANEL
AG Ratio: 1.6 (calc) (ref 1.0–2.5)
ALBUMIN MSPROF: 4.4 g/dL (ref 3.6–5.1)
ALKALINE PHOSPHATASE (APISO): 54 U/L (ref 40–115)
ALT: 20 U/L (ref 9–46)
AST: 16 U/L (ref 10–40)
BUN: 16 mg/dL (ref 7–25)
CHLORIDE: 102 mmol/L (ref 98–110)
CO2: 28 mmol/L (ref 20–32)
CREATININE: 1.25 mg/dL (ref 0.60–1.35)
Calcium: 9.6 mg/dL (ref 8.6–10.3)
GLOBULIN: 2.7 g/dL (ref 1.9–3.7)
GLUCOSE: 125 mg/dL — AB (ref 65–99)
POTASSIUM: 4.6 mmol/L (ref 3.5–5.3)
SODIUM: 139 mmol/L (ref 135–146)
Total Bilirubin: 0.4 mg/dL (ref 0.2–1.2)
Total Protein: 7.1 g/dL (ref 6.1–8.1)

## 2017-05-13 LAB — LIPID PANEL
Cholesterol: 176 mg/dL (ref <200)
HDL: 56 mg/dL (ref >40)
LDL Cholesterol (Calc): 103 mg/dL (calc) — ABNORMAL HIGH
NON-HDL CHOLESTEROL (CALC): 120 mg/dL (ref <130)
Total CHOL/HDL Ratio: 3.1 (calc) (ref <5.0)
Triglycerides: 82 mg/dL (ref <150)

## 2017-05-13 LAB — HEMOGLOBIN A1C
Hgb A1c MFr Bld: 6.8 % of total Hgb — ABNORMAL HIGH (ref <5.7)
Mean Plasma Glucose: 148 (calc)
eAG (mmol/L): 8.2 (calc)

## 2017-05-14 ENCOUNTER — Other Ambulatory Visit: Payer: Self-pay | Admitting: Medical

## 2017-05-14 MED ORDER — SITAGLIPTIN PHOS-METFORMIN HCL 50-1000 MG PO TABS: 1.0000 | ORAL_TABLET | Freq: Two times a day (BID) | ORAL | 1 refills | Status: DC

## 2017-05-14 MED ORDER — CITALOPRAM HYDROBROMIDE 20 MG PO TABS: 20.0000 mg | ORAL_TABLET | Freq: Every day | ORAL | 0 refills | Status: DC

## 2017-06-09 ENCOUNTER — Other Ambulatory Visit: Payer: Self-pay | Admitting: Family Medicine

## 2017-06-09 ENCOUNTER — Telehealth: Payer: Self-pay | Admitting: Medical

## 2017-06-09 NOTE — Telephone Encounter (Signed)
Your patient 

## 2017-06-09 NOTE — Telephone Encounter (Signed)
Call and see how he is doing on Chantix?   Has he quit tobacco? Any problems with the Chantix?

## 2017-06-10 ENCOUNTER — Other Ambulatory Visit: Payer: Self-pay | Admitting: Medical

## 2017-06-10 MED ORDER — VARENICLINE TARTRATE 1 MG PO TABS: 1.0000 mg | ORAL_TABLET | Freq: Two times a day (BID) | ORAL | 0 refills | Status: DC

## 2017-06-10 NOTE — Telephone Encounter (Signed)
Ok, I will send continuing month pak.  Have him make f/u 44mo

## 2017-06-10 NOTE — Telephone Encounter (Signed)
Spoke with pt  He is use the Chantix , no side effect of meds. He said that has no quite yet but has cut back.

## 2017-06-10 NOTE — Telephone Encounter (Signed)
yours

## 2017-07-01 DIAGNOSIS — D485 Neoplasm of uncertain behavior of skin: Secondary | ICD-10-CM | POA: Diagnosis not present

## 2017-07-13 ENCOUNTER — Other Ambulatory Visit: Payer: Self-pay | Admitting: Medical

## 2017-07-15 NOTE — Telephone Encounter (Signed)
Is this ok to refill?  

## 2017-07-16 NOTE — Telephone Encounter (Signed)
Decline and get him back for f/u on smoking cessation

## 2017-08-20 ENCOUNTER — Other Ambulatory Visit: Payer: Self-pay | Admitting: Medical

## 2017-11-11 ENCOUNTER — Ambulatory Visit: Payer: Self-pay

## 2017-11-12 ENCOUNTER — Ambulatory Visit: Payer: Self-pay | Admitting: Internal Medicine

## 2017-11-12 ENCOUNTER — Encounter: Payer: Self-pay | Admitting: Internal Medicine

## 2017-11-12 VITALS — BP 150/98 | HR 82 | Temp 98.2°F | Wt 241.3 lb

## 2017-11-12 DIAGNOSIS — Z720 Tobacco use: Secondary | ICD-10-CM

## 2017-11-12 DIAGNOSIS — G4700 Insomnia, unspecified: Secondary | ICD-10-CM

## 2017-11-12 DIAGNOSIS — E118 Type 2 diabetes mellitus with unspecified complications: Secondary | ICD-10-CM

## 2017-11-12 DIAGNOSIS — I10 Essential (primary) hypertension: Secondary | ICD-10-CM

## 2017-11-12 DIAGNOSIS — E1165 Type 2 diabetes mellitus with hyperglycemia: Secondary | ICD-10-CM

## 2017-11-12 DIAGNOSIS — Z7984 Long term (current) use of oral hypoglycemic drugs: Secondary | ICD-10-CM

## 2017-11-12 DIAGNOSIS — IMO0002 Reserved for concepts with insufficient information to code with codable children: Secondary | ICD-10-CM

## 2017-11-12 DIAGNOSIS — R450 Nervousness: Secondary | ICD-10-CM

## 2017-11-12 DIAGNOSIS — F411 Generalized anxiety disorder: Secondary | ICD-10-CM

## 2017-11-12 DIAGNOSIS — N529 Male erectile dysfunction, unspecified: Secondary | ICD-10-CM

## 2017-11-12 DIAGNOSIS — E785 Hyperlipidemia, unspecified: Secondary | ICD-10-CM

## 2017-11-12 DIAGNOSIS — F17210 Nicotine dependence, cigarettes, uncomplicated: Secondary | ICD-10-CM

## 2017-11-12 LAB — POCT GLYCOSYLATED HEMOGLOBIN (HGB A1C): Hemoglobin A1C: 8.4

## 2017-11-12 LAB — GLUCOSE, CAPILLARY: GLUCOSE-CAPILLARY: 222 mg/dL — AB (ref 65–99)

## 2017-11-12 MED ORDER — CLONAZEPAM 0.5 MG PO TABS: 0.5000 mg | ORAL_TABLET | Freq: Two times a day (BID) | ORAL | 0 refills | Status: DC | PRN

## 2017-11-12 MED ORDER — LISINOPRIL 10 MG PO TABS: 10.0000 mg | ORAL_TABLET | Freq: Every day | ORAL | 2 refills | Status: DC

## 2017-11-12 MED ORDER — METFORMIN HCL 1000 MG PO TABS: 1000.0000 mg | ORAL_TABLET | Freq: Two times a day (BID) | ORAL | 2 refills | Status: DC

## 2017-11-12 MED ORDER — SILDENAFIL CITRATE 100 MG PO TABS: 100.0000 mg | ORAL_TABLET | Freq: Every day | ORAL | 2 refills | Status: DC | PRN

## 2017-11-12 NOTE — Progress Notes (Signed)
CC: Visit to establish care with new provider, diabetes  HPI:  [name] is a 42 y.o. male with PMHx detailed below presenting to establish care with internal medicine center as a new primary care office.  His known active medical problems include diabetes, hypertension, tobacco use, erectile dysfunction, and generalized anxiety.  He is also been getting steroid injections at a dermatology office for keloids. He currently needs medication refills due to several months since his last physician's office visit.  He is overall feeling in good health besides a lot of anxiety related to workplace and marital stressors.  See problem based assessment and plan below for additional details.  Uncontrolled type 2 diabetes mellitus with complication, without long-term current use of insulin ([place]) He was previously taking metformin 1000 mg twice daily with slightly uncontrolled type 2 diabetes.  He reports previously weighing 300 pounds and it is 241 pounds today.  Hemoglobin A1c checked in the office is 8.4%.  He is not having obvious symptoms of myopia, urinary frequency, or evidence of neuropathy. Plan: Resume taking metformin 1000 mg twice daily Repeat hemoglobin A1c in 3 months now that he is back on medication  Erectile dysfunction He reports unsatisfactory duration or strength of erection which is contributing to some marital stress.  He does not have trouble with libido.  I think this is likely related to his high amount of total life stress and anxiety rather than an underlying organic cause.  His risk for underlying cause would be hypertension, diabetes, and ongoing tobacco use. He reports previous good benefit with sildenafil as needed. Plan: Reordered sildenafil 100 mg up to once daily as needed  Generalized anxiety disorder He has a high amount of stress largely related to financial trouble at work and relocating to a new office.  He works as a Warden/ranger.  Is also causing  some difficulty in falling asleep at night leading to daytime fatigue.  He was previously prescribed short course of PRN benzodiazepines at his previous providers office which he felt were beneficial.  I discussed that this is not a great long-term medicine due to tolerance and physiologic dependence with prolonged use. I think is reasonable to start a short course of long-acting clonazepam while he adjusts to recent life stressors.  If he still requiring treatment for anxiety after another 6 to 8 weeks will need to discard discuss starting a SSRI/SNRI.  He was previously recommended to start Celexa but does not report ever doing so. Plan: Klonopin 0.5 mg daily as needed for anxiety If requiring treatment beyond this duration would require also starting pharmacotherapy with an SSRI or SNRI  Hyperlipidemia Reordered pravastatin 20 mg daily which was his previous statin regimen.  Once he establishes for assistance with Neoma Laming he will we can repeat more comprehensive lab studies and characterize his lipid profile.  Tobacco use He is a current half pack per day smoker with about a 15-year history.  He seems contemplative about smoking cessation or reduction although feels his current situation is not ideal.  I think he might be receptive to discussing pharmacological aids for smoking cessation within the near future   Past Medical History:  Diagnosis Date  . Anxiety   . Chronic back pain   . Depression    in remote past  . Diabetes mellitus without complication ([place]) 7209  . Hyperlipidemia 2008  . Hypertension 2008  . Keloid   . Wears glasses     Review of Systems: Review of  Systems  Constitutional: Negative for chills and fever.  HENT: Negative for hearing loss.   Eyes: Negative for blurred vision.  Respiratory: Negative for shortness of breath.   Cardiovascular: Negative for chest pain and leg swelling.  Gastrointestinal: Negative for constipation and heartburn.  Genitourinary:  Negative for frequency.  Musculoskeletal: Negative for joint pain.  Skin: Negative for rash.       keloids  Neurological: Negative for dizziness.  Endo/Heme/Allergies: Negative for environmental allergies.  Psychiatric/Behavioral: The patient is nervous/anxious and has insomnia.      Physical Exam: Vitals:   11/12/17 0952  BP: (!) 150/98  Pulse: 82  Temp: 98.2 F (36.8 C)  TempSrc: Oral  SpO2: 100%  Weight: 241 lb 4.8 oz (109.5 kg)   GENERAL- alert, co-operative, NAD HEENT- Atraumatic, oral mucosa appears moist, good and intact dentition, no carotid bruit, no cervical LN enlargement. CARDIAC- RRR, no murmurs, rubs or gallops. RESP- CTAB, no wheezes or crackles. ABDOMEN- Soft, nontender BACK- Normal curvature, no paraspinal tenderness, no CVA tenderness. NEURO- Strength upper and lower extremities- 5/5, Sensation intact globally EXTREMITIES- pulse 2+, symmetric, no pedal edema. SKIN-acutely changes over chest and smaller lesions on bilateral shoulders PSYCH- Normal mood and affect, appropriate thought content and speech.   Assessment & Plan:   See encounters tab for problem based medical decision making.   Patient discussed with Dr. Beryle Beams

## 2017-11-12 NOTE — Patient Instructions (Addendum)
It was a pleasure to see you today Mr. Bellew.  I have ordered [facility] of your medications at [facility] so you can start resuming treatment for diabetes and blood pressure.  For the short term we can try an anti-anxiety medicine clonazepam 1-2 times daily. This is not a great long term medicine so we can see if things are going in the right direction at a follow up visit.  I strongly recommend making an appointment with Marlana Latus to see about getting plugged in with the [facility] benefits card or other assistance program. Please try and arrange to return for follow up once this is done so we can get caught up on other health maintenance.

## 2017-11-17 NOTE — Assessment & Plan Note (Addendum)
He is a current half pack per day smoker with about a 15-year history.  He seems contemplative about smoking cessation or reduction although feels his current situation is not ideal.  I think he might be receptive to discussing pharmacological aids for smoking cessation within the near future

## 2017-11-17 NOTE — Assessment & Plan Note (Signed)
He was previously taking metformin 1000 mg twice daily with slightly uncontrolled type 2 diabetes.  He reports previously weighing 300 pounds and it is 241 pounds today.  Hemoglobin A1c checked in the office is 8.4%.  He is not having obvious symptoms of myopia, urinary frequency, or evidence of neuropathy. Plan: Resume taking metformin 1000 mg twice daily Repeat hemoglobin A1c in 3 months now that he is back on medication

## 2017-11-17 NOTE — Assessment & Plan Note (Signed)
Reordered pravastatin 20 mg daily which was his previous statin regimen.  Once he establishes for assistance with Neoma Laming he will we can repeat more comprehensive lab studies and characterize his lipid profile.

## 2017-11-17 NOTE — Progress Notes (Signed)
Medicine attending: Medical history, presenting problems, physical findings, and medications, reviewed with resident physician Dr Christopher Rice on the day of the patient visit and I concur with his evaluation and management plan. 

## 2017-11-17 NOTE — Assessment & Plan Note (Addendum)
He has a high amount of stress largely related to financial trouble at work and relocating to a new office.  He works as a Warden/ranger.  Is also causing some difficulty in falling asleep at night leading to daytime fatigue.  He was previously prescribed short course of PRN benzodiazepines at his previous providers office which he felt were beneficial.  I discussed that this is not a great long-term medicine due to tolerance and physiologic dependence with prolonged use. I think is reasonable to start a short course of long-acting clonazepam while he adjusts to recent life stressors.  If he still requiring treatment for anxiety after another 6 to 8 weeks will need to discard discuss starting a SSRI/SNRI.  He was previously recommended to start Celexa but does not report ever doing so. Plan: Klonopin 0.5 mg daily as needed for anxiety If requiring treatment beyond this duration would require also starting pharmacotherapy with an SSRI or SNRI

## 2017-11-17 NOTE — Assessment & Plan Note (Signed)
He reports unsatisfactory duration or strength of erection which is contributing to some marital stress.  He does not have trouble with libido.  I think this is likely related to his high amount of total life stress and anxiety rather than an underlying organic cause.  His risk for underlying cause would be hypertension, diabetes, and ongoing tobacco use. He reports previous good benefit with sildenafil as needed. Plan: Reordered sildenafil 100 mg up to once daily as needed

## 2017-12-18 ENCOUNTER — Other Ambulatory Visit: Payer: Self-pay

## 2017-12-18 ENCOUNTER — Encounter (HOSPITAL_COMMUNITY): Payer: Self-pay | Admitting: Emergency Medicine

## 2017-12-18 ENCOUNTER — Emergency Department (HOSPITAL_COMMUNITY): Payer: Self-pay

## 2017-12-18 ENCOUNTER — Emergency Department (HOSPITAL_COMMUNITY)
Admission: EM | Admit: 2017-12-18 | Discharge: 2017-12-19 | Disposition: A | Discharge status: Home / Self Care | Payer: Self-pay | Attending: Emergency Medicine | Admitting: Emergency Medicine

## 2017-12-18 DIAGNOSIS — F17210 Nicotine dependence, cigarettes, uncomplicated: Secondary | ICD-10-CM | POA: Insufficient documentation

## 2017-12-18 DIAGNOSIS — Z79899 Other long term (current) drug therapy: Secondary | ICD-10-CM | POA: Insufficient documentation

## 2017-12-18 DIAGNOSIS — R519 Headache, unspecified: Secondary | ICD-10-CM

## 2017-12-18 DIAGNOSIS — I10 Essential (primary) hypertension: Secondary | ICD-10-CM | POA: Insufficient documentation

## 2017-12-18 DIAGNOSIS — E119 Type 2 diabetes mellitus without complications: Secondary | ICD-10-CM | POA: Insufficient documentation

## 2017-12-18 DIAGNOSIS — R51 Headache: Secondary | ICD-10-CM

## 2017-12-18 DIAGNOSIS — Z7984 Long term (current) use of oral hypoglycemic drugs: Secondary | ICD-10-CM | POA: Insufficient documentation

## 2017-12-18 LAB — I-STAT CHEM 8, ED
BUN: 20 mg/dL (ref 6–20)
CREATININE: 1.1 mg/dL (ref 0.61–1.24)
Calcium, Ion: 1.15 mmol/L (ref 1.15–1.40)
Chloride: 102 mmol/L (ref 101–111)
Glucose, Bld: 142 mg/dL — ABNORMAL HIGH (ref 65–99)
HEMATOCRIT: 42 % (ref 39.0–52.0)
Hemoglobin: 14.3 g/dL (ref 13.0–17.0)
POTASSIUM: 4 mmol/L (ref 3.5–5.1)
Sodium: 140 mmol/L (ref 135–145)
TCO2: 25 mmol/L (ref 22–32)

## 2017-12-18 MED ORDER — METOCLOPRAMIDE HCL 5 MG/ML IJ SOLN
10.0000 mg | Freq: Once | INTRAMUSCULAR | Status: AC
  Administered 2017-12-18: 10 mg via INTRAVENOUS
  Filled 2017-12-18: qty 2

## 2017-12-18 MED ORDER — KETOROLAC TROMETHAMINE 30 MG/ML IJ SOLN
10.0000 mg | Freq: Once | INTRAMUSCULAR | Status: AC
  Administered 2017-12-18: 9.9 mg via INTRAVENOUS
  Filled 2017-12-18: qty 1

## 2017-12-18 NOTE — ED Triage Notes (Signed)
Pt C/O headache that has been "off and on for the last month." Pt C/O dizziness and blurry vision. Pt states his BP machine at home is reading "high."

## 2017-12-19 MED ORDER — HYDROCHLOROTHIAZIDE 25 MG PO TABS: 25.0000 mg | ORAL_TABLET | Freq: Every day | ORAL | 2 refills | Status: DC

## 2017-12-19 MED ORDER — ACETAMINOPHEN ER 650 MG PO TBCR: 650.0000 mg | EXTENDED_RELEASE_TABLET | Freq: Three times a day (TID) | ORAL | 0 refills | Status: DC

## 2017-12-19 MED ORDER — IBUPROFEN 600 MG PO TABS: 600.0000 mg | ORAL_TABLET | Freq: Four times a day (QID) | ORAL | 0 refills | Status: DC | PRN

## 2017-12-19 NOTE — ED Provider Notes (Signed)
[facility] EMERGENCY DEPARTMENT Provider Note   CSN: [id number] Arrival date & time: 12/18/17  1959     History   Chief Complaint Chief Complaint  Patient presents with  . Headache    HPI GARRET TEALE is a 42 y.o. male.  HPI  42 year old male comes in with chief complaint of headaches.  Patient states that he started having headaches about a month ago.  Headaches are intermittent and located in different locations of his head.  When intense, patient's headaches are 8 out of 10.  He denies any specific evoking, aggravating or relieving factors.  Patient denies any associated numbness, tingling, vision changes or dizziness.  Patient also noticed that his blood pressures have trended up.  Unfortunately he lost his insurance, and has been unable to follow-up with the PCP.  Past Medical History:  Diagnosis Date  . Anxiety   . Chronic back pain   . Depression    in remote past  . Diabetes mellitus without complication ([place]) 9562  . Hyperlipidemia 2008  . Hypertension 2008  . Keloid   . Wears glasses     Patient Active Problem List   Diagnosis Date Noted  . Microalbuminuria 08/07/2016  . Encounter for health maintenance examination in adult 08/05/2016  . Hyperlipidemia 08/05/2016  . Erectile dysfunction 08/05/2016  . Keloid 08/05/2016  . Tobacco use 08/05/2016  . Generalized anxiety disorder 08/05/2016  . Obesity 01/04/2015  . Uncontrolled type 2 diabetes mellitus with complication, without long-term current use of insulin ([place]) 03/31/2007    Past Surgical History:  Procedure Laterality Date  . ANTERIOR CRUCIATE LIGAMENT REPAIR     right ACL repair, patellar repair, remove cartilage damage; 42yo        Home Medications    Prior to Admission medications   Medication Sig Start Date End Date Taking? Authorizing Provider  BD ULTRA-FINE LANCETS lancets Use as instructed 08/07/16   Tysinger, Camelia Eng, PA-C  citalopram (CELEXA) 20 MG tablet TAKE 1 TABLET BY MOUTH  EVERY DAY 08/20/17   Tysinger, Camelia Eng, PA-C  clonazePAM (KLONOPIN) 0.5 MG tablet Take 1 tablet (0.5 mg total) by mouth 2 (two) times daily as needed for anxiety. 11/12/17   Rice, Resa Miner, MD  hydrochlorothiazide (HYDRODIURIL) 25 MG tablet Take 1 tablet (25 mg total) by mouth daily. 12/19/17   Varney Biles, MD  lisinopril (PRINIVIL,ZESTRIL) 10 MG tablet Take 1 tablet (10 mg total) by mouth daily. 11/12/17   Collier Salina, MD  metFORMIN (GLUCOPHAGE) 1000 MG tablet Take 1 tablet (1,000 mg total) by mouth 2 (two) times daily with a meal. 11/12/17 02/10/18  Rice, Resa Miner, MD  pravastatin (PRAVACHOL) 20 MG tablet Take 1 tablet (20 mg total) by mouth daily. 02/10/17   Tysinger, Camelia Eng, PA-C  sildenafil (VIAGRA) 100 MG tablet Take 1 tablet (100 mg total) by mouth daily as needed for erectile dysfunction. 11/12/17   Collier Salina, MD    Family History Family History  Problem Relation Age of Onset  . Kidney disease Father   . Vascular Disease Father   . Stroke Paternal Grandmother   . Diabetes Paternal Grandmother   . Cancer Neg Hx   . Heart disease Neg Hx     Social History Social History   Tobacco Use  . Smoking status: Current Every Day Smoker    Packs/day: 0.50    Years: 15.00    Pack years: 7.50    Types: Cigars, Cigarettes  . Smokeless tobacco: Never Used  Substance Use Topics  . Alcohol use: Yes    Alcohol/week: 2.4 oz    Types: 4 Shots of liquor per week    Comment: occ  . Drug use: Yes    Types: Marijuana     Allergies   Penicillins   Review of Systems Review of Systems  Constitutional: Positive for activity change.  Cardiovascular: Negative for chest pain.  Gastrointestinal: Negative for nausea and vomiting.  Neurological: Positive for headaches.  All other systems reviewed and are negative.    Physical Exam Updated Vital Signs BP (!) 167/97   Pulse 76   Temp 98.7 F (37.1 C) (Oral)   Resp 18   Ht 6\' 2"  (1.88 m)   Wt 106.6 kg (235 lb)    SpO2 97%   BMI 30.17 kg/m   Physical Exam  Constitutional: He is oriented to person, place, and time. He appears well-developed.  HENT:  Head: Atraumatic.  Neck: Neck supple.  Cardiovascular: Normal rate.  Pulmonary/Chest: Effort normal.  Neurological: He is alert and oriented to person, place, and time. No cranial nerve deficit.  Skin: Skin is warm.  Nursing note and vitals reviewed.    ED Treatments / Results  Labs (all labs ordered are listed, but only abnormal results are displayed) Labs Reviewed  I-STAT CHEM 8, ED - Abnormal; Notable for the following components:      Result Value   Glucose, Bld 142 (*)    All other components within normal limits    EKG None  Radiology Ct Head Wo Contrast  Result Date: 12/18/2017 CLINICAL DATA:  Chronic intermittent headache. Dizziness and blurred vision. High blood pressure. EXAM: CT HEAD WITHOUT CONTRAST TECHNIQUE: Contiguous axial images were obtained from the base of the skull through the vertex without intravenous contrast. COMPARISON:  None. FINDINGS: Brain: No evidence of acute infarction, hemorrhage, hydrocephalus, extra-axial collection or mass lesion/mass effect. The posterior fossa, including the cerebellum, brainstem and fourth ventricle, is within normal limits. The third and lateral ventricles, and basal ganglia are unremarkable in appearance. The cerebral hemispheres are symmetric in appearance, with normal gray-white differentiation. No mass effect or midline shift is seen. Vascular: No hyperdense vessel or unexpected calcification. Skull: There is no evidence of fracture; visualized osseous structures are unremarkable in appearance. Sinuses/Orbits: The orbits are within normal limits. The paranasal sinuses and mastoid air cells are well-aerated. Other: No significant soft tissue abnormalities are seen. IMPRESSION: Unremarkable noncontrast CT of the head. Electronically Signed   By: Garald Balding M.D.   On: 12/18/2017 23:47     Procedures Procedures (including critical care time)  Medications Ordered in ED Medications  metoCLOPramide (REGLAN) injection 10 mg (10 mg Intravenous Given 12/18/17 2252)  ketorolac (TORADOL) 30 MG/ML injection 9.9 mg (9.9 mg Intravenous Given 12/18/17 2253)     Initial Impression / Assessment and Plan / ED Course  I have reviewed the triage vital signs and the nursing notes.  Pertinent labs & imaging results that were available during my care of the patient were reviewed by me and considered in my medical decision making (see chart for details).    42 year old male comes in with chief complaint of headache and elevated blood pressure.  Patient has had a headache for about a month, and they are not responding to over-the-counter medicines.  Patient does not have any red flags suggesting elevated intracranial pressure and he does not have any focal neurologic deficits.  We recommended that patient get outpatient follow-up, however he does not have  a PCP and is gravely concerned that he might be having tumor, given there is family history of cancers.  CT scan of the head is negative. Patient's blood pressure is also slightly elevated.  We will start him on hydrochlorothiazide.   Final Clinical Impressions(s) / ED Diagnoses   Final diagnoses:  Hypertension, unspecified type  Bad headache    ED Discharge Orders        Ordered    hydrochlorothiazide (HYDRODIURIL) 25 MG tablet  Daily     12/19/17 0019       Varney Biles, MD 12/19/17 5859

## 2017-12-19 NOTE — Discharge Instructions (Addendum)
All the results in the ER are normal, labs and imaging. We are not sure what is causing your symptoms. The workup in the ER is not complete, and is limited to screening for life threatening and emergent conditions only, so please see a primary care doctor for further evaluation.  

## 2017-12-23 ENCOUNTER — Other Ambulatory Visit: Payer: Self-pay | Admitting: Medical

## 2017-12-23 NOTE — Telephone Encounter (Signed)
Is this ok to refill?  

## 2018-01-05 ENCOUNTER — Ambulatory Visit: Payer: Self-pay

## 2018-01-07 ENCOUNTER — Ambulatory Visit: Payer: Self-pay | Admitting: Internal Medicine

## 2018-01-07 ENCOUNTER — Other Ambulatory Visit: Payer: Self-pay

## 2018-01-07 ENCOUNTER — Encounter: Payer: Self-pay | Admitting: Internal Medicine

## 2018-01-07 VITALS — BP 112/69 | HR 89 | Temp 98.5°F | Ht 73.0 in | Wt 238.9 lb

## 2018-01-07 DIAGNOSIS — F339 Major depressive disorder, recurrent, unspecified: Secondary | ICD-10-CM

## 2018-01-07 DIAGNOSIS — Z6379 Other stressful life events affecting family and household: Secondary | ICD-10-CM

## 2018-01-07 DIAGNOSIS — E118 Type 2 diabetes mellitus with unspecified complications: Secondary | ICD-10-CM

## 2018-01-07 DIAGNOSIS — I152 Hypertension secondary to endocrine disorders: Secondary | ICD-10-CM

## 2018-01-07 DIAGNOSIS — Z79899 Other long term (current) drug therapy: Secondary | ICD-10-CM

## 2018-01-07 DIAGNOSIS — I10 Essential (primary) hypertension: Secondary | ICD-10-CM

## 2018-01-07 DIAGNOSIS — Z7984 Long term (current) use of oral hypoglycemic drugs: Secondary | ICD-10-CM

## 2018-01-07 DIAGNOSIS — E1159 Type 2 diabetes mellitus with other circulatory complications: Secondary | ICD-10-CM

## 2018-01-07 DIAGNOSIS — E1165 Type 2 diabetes mellitus with hyperglycemia: Secondary | ICD-10-CM

## 2018-01-07 DIAGNOSIS — L02223 Furuncle of chest wall: Secondary | ICD-10-CM | POA: Insufficient documentation

## 2018-01-07 DIAGNOSIS — IMO0002 Reserved for concepts with insufficient information to code with codable children: Secondary | ICD-10-CM

## 2018-01-07 DIAGNOSIS — Z720 Tobacco use: Secondary | ICD-10-CM

## 2018-01-07 DIAGNOSIS — N529 Male erectile dysfunction, unspecified: Secondary | ICD-10-CM

## 2018-01-07 DIAGNOSIS — F411 Generalized anxiety disorder: Secondary | ICD-10-CM

## 2018-01-07 MED ORDER — METFORMIN HCL 1000 MG PO TABS: 1000.0000 mg | ORAL_TABLET | Freq: Two times a day (BID) | ORAL | 2 refills | Status: DC

## 2018-01-07 MED ORDER — LISINOPRIL 10 MG PO TABS: 10.0000 mg | ORAL_TABLET | Freq: Every day | ORAL | 2 refills | Status: DC

## 2018-01-07 MED ORDER — SERTRALINE HCL 25 MG PO TABS: 25.0000 mg | ORAL_TABLET | Freq: Every day | ORAL | 2 refills | Status: DC

## 2018-01-07 NOTE — Patient Instructions (Addendum)
It was a pleasure to see you Dustin Zamora.  Please increase metformin to 1000 mg twice a day.  Please start sertraline (Zoloft) 25 mg once a day.  We can increase this if we need to.  It may take 4 to 6 weeks for you to see the benefits.  I have refilled your lisinopril, metformin, and sildenafil to the [facility].  Keep an eye on the boil on the chest wall and call us for any of the changes that we discussed.  Please follow-up with [place] again in 2 months to recheck your hemoglobin A1c.

## 2018-01-07 NOTE — Assessment & Plan Note (Signed)
Patient with furuncle adjacent to chronic keloid at the lower sternum.  He noticed this about a week ago and has not seen any significant change in size, erythema, or discharge. A/P: Furuncle without erythema, discharge, fluctuance, or tenderness to palpation.  No obvious active soft skin infection.  Advised patient to continue to monitor, may use warm compresses.

## 2018-01-07 NOTE — Assessment & Plan Note (Signed)
Patient was seen in the ED on 12/18/2017 for elevated blood pressure.  He was taking lisinopril 10 mg daily prior to his visit.  His BP was 167/97 in the ED and he was started on HCTZ 25 mg daily which she has been taking since.  His BP is 112/69 on arrival today. BP Readings from Last 3 Encounters:  01/07/18 112/69  12/19/17 (!) 141/93  11/12/17 (!) 150/98   A/P: His blood pressure is well controlled.  Will continue lisinopril 10 mg daily and HCTZ 25 mg daily.  Repeat BMP on follow-up to assess renal function.

## 2018-01-07 NOTE — Progress Notes (Signed)
CC: Diabetes, hypertension, anxiety  HPI:  [name] is a 42 y.o. male with PMH as listed below including Type 2 diabetes, hypertension, generalized anxiety, and tobacco use who presents for follow-up management of his diabetes.  Please see problem based charting for status of patient's chronic medical issues.  Uncontrolled type 2 diabetes mellitus with complication, without long-term current use of insulin ([facility]) A1c was 8.4 on 11/12/2017.  He was instructed to restart metformin 1000 mg twice a day.  Patient states he has been taking metformin 500 mg twice a day. A/P: His A1c was above goal.  I recommend he increase his metformin to 1000 mg twice daily and follow-up in 2 months for repeat A1c. Microalbumin to creatinine ratio was 96 mcg/mg January 2018.  Generalized anxiety disorder Patient reports chronic anxiety related to home stressors between his wife and family.  He also reports a history of depression.  He has been taking clonazepam 0.5 mg as needed once a day but not every day.  He says he has been on Zoloft in the past but thought this was giving him erectile dysfunction issues.  He was prescribed citalopram earlier this year but is unclear if he was taking it. A/P: GAD-7 score today is 5 patient indicates that his symptoms make it somewhat difficult for him to either do his work, take care of things at home, or get along with other people.  This indicates that he has mild generalized anxiety disorder.  I discussed that it would not be ideal to have him on long-term benzodiazepine and recommended initiating him on SSRI or SNRI.  He is agreeable to retry sertraline and discontinue clonazepam. -Start sertraline 25 mg daily -can increase dose if needed; patient advised may take 4 to 6 weeks for potential benefit to be seen -Discontinue clonazepam -Reassess on follow-up  Hypertension associated with diabetes ([place]) Patient was seen in the ED on 12/18/2017 for elevated blood pressure.   He was taking lisinopril 10 mg daily prior to his visit.  His BP was 167/97 in the ED and he was started on HCTZ 25 mg daily which she has been taking since.  His BP is 112/69 on arrival today. BP Readings from Last 3 Encounters:  01/07/18 112/69  12/19/17 (!) 141/93  11/12/17 (!) 150/98   A/P: His blood pressure is well controlled.  Will continue lisinopril 10 mg daily and HCTZ 25 mg daily.  Repeat BMP on follow-up to assess renal function.  Erectile dysfunction Patient reports erectile dysfunction for trip she has been using sildenafil as needed.  He has reported unsatisfactory duration or strength of erection denies any morning erections.  He does have anxiety and stressors in his life which are likely contributing.  He does also have underlying risk factors with hypertension, diabetes, and tobacco use.  He is requesting a refill of his sildenafil. A/P: Erectile dysfunction likely secondary to anxiety, stress, and potentially from some component of his underlying risk factors.  Will refill sildenafil 100 mg to once daily as needed.  Patient is cautioned on side effects and potential for hypotension.  Furuncle of chest wall Patient with furuncle adjacent to chronic keloid at the lower sternum.  He noticed this about a week ago and has not seen any significant change in size, erythema, or discharge. A/P: Furuncle without erythema, discharge, fluctuance, or tenderness to palpation.  No obvious active soft skin infection.  Advised patient to continue to monitor, may use warm compresses.    Past  Medical History:  Diagnosis Date  . Anxiety   . Chronic back pain   . Depression    in remote past  . Diabetes mellitus without complication ([place]) 2774  . Hyperlipidemia 2008  . Hypertension 2008  . Keloid   . Wears glasses    Review of Systems:   Review of Systems  Constitutional: Negative for chills and fever.  Respiratory: Negative for shortness of breath.   Cardiovascular: Negative for  chest pain and leg swelling.  Genitourinary:       Erectile dysfunction  Skin:       Boil on chest wall  Psychiatric/Behavioral: Positive for depression. Negative for suicidal ideas. The patient is nervous/anxious.      Physical Exam:  Vitals:   01/07/18 1454  BP: 112/69  Pulse: 89  Temp: 98.5 F (36.9 C)  TempSrc: Oral  SpO2: 98%  Weight: 238 lb 14.4 oz (108.4 kg)  Height: 6\' 1"  (1.854 m)   Physical Exam  Constitutional: He is oriented to person, place, and time. He appears well-developed and well-nourished. No distress.  HENT:  Head: Normocephalic and atraumatic.  Cardiovascular: Normal rate and regular rhythm.  No murmur heard. Pulmonary/Chest: Effort normal. No respiratory distress. He has no wheezes. He has no rales.  Musculoskeletal: Normal range of motion. He exhibits no edema.  Neurological: He is alert and oriented to person, place, and time.  Skin: Skin is warm. He is not diaphoretic.  Pink keloid on chest wall at the lower sternum near the xiphoid process.  Small furuncle approximately half a centimeter laterally to the right of the keloid without tenderness to palpation, erythema, discharge, or fluctuance.     Assessment & Plan:   See Encounters Tab for problem based charting.  Patient discussed with Dr. Angelia Mould

## 2018-01-07 NOTE — Assessment & Plan Note (Signed)
Patient reports erectile dysfunction for trip she has been using sildenafil as needed.  He has reported unsatisfactory duration or strength of erection denies any morning erections.  He does have anxiety and stressors in his life which are likely contributing.  He does also have underlying risk factors with hypertension, diabetes, and tobacco use.  He is requesting a refill of his sildenafil. A/P: Erectile dysfunction likely secondary to anxiety, stress, and potentially from some component of his underlying risk factors.  Will refill sildenafil 100 mg to once daily as needed.  Patient is cautioned on side effects and potential for hypotension.

## 2018-01-07 NOTE — Assessment & Plan Note (Signed)
Patient reports chronic anxiety related to home stressors between his wife and family.  He also reports a history of depression.  He has been taking clonazepam 0.5 mg as needed once a day but not every day.  He says he has been on Zoloft in the past but thought this was giving him erectile dysfunction issues.  He was prescribed citalopram earlier this year but is unclear if he was taking it. A/P: GAD-7 score today is 5 patient indicates that his symptoms make it somewhat difficult for him to either do his work, take care of things at home, or get along with other people.  This indicates that he has mild generalized anxiety disorder.  I discussed that it would not be ideal to have him on long-term benzodiazepine and recommended initiating him on SSRI or SNRI.  He is agreeable to retry sertraline and discontinue clonazepam. -Start sertraline 25 mg daily -can increase dose if needed; patient advised may take 4 to 6 weeks for potential benefit to be seen -Discontinue clonazepam -Reassess on follow-up

## 2018-01-07 NOTE — Assessment & Plan Note (Signed)
A1c was 8.4 on 11/12/2017.  He was instructed to restart metformin 1000 mg twice a day.  Patient states he has been taking metformin 500 mg twice a day. A/P: His A1c was above goal.  I recommend he increase his metformin to 1000 mg twice daily and follow-up in 2 months for repeat A1c. Microalbumin to creatinine ratio was 96 mcg/mg January 2018.

## 2018-01-11 NOTE — Progress Notes (Signed)
Internal Medicine Clinic Attending  Case discussed with Dr. Patel at the time of the visit.  We reviewed the resident's history and exam and pertinent patient test results.  I agree with the assessment, diagnosis, and plan of care documented in the resident's note.  

## 2018-01-19 ENCOUNTER — Other Ambulatory Visit: Payer: Self-pay

## 2018-01-19 NOTE — Telephone Encounter (Signed)
Rx sent to [facility] on [facility] for sildenafil 100mg  #10 w/2 refills.  [facility] to [facility] on [address].  Contacted [facility]-pt has a refill left on file-call made to pt to make him aware, no answer-HIPPA compliant message left on recorder.Despina Hidden Cassady7/9/20193:58 PM

## 2018-01-19 NOTE — Telephone Encounter (Signed)
sildenafil (VIAGRA) 100 MG tablet, is not at the pharmacy. Pt is using [facility] on [address]

## 2018-02-04 ENCOUNTER — Ambulatory Visit: Payer: Medicaid Other

## 2018-02-22 ENCOUNTER — Other Ambulatory Visit: Payer: Self-pay | Admitting: Medical

## 2018-02-22 NOTE — Telephone Encounter (Signed)
Is this refill appropriate?  

## 2018-03-10 ENCOUNTER — Ambulatory Visit: Payer: Medicaid Other

## 2018-04-06 ENCOUNTER — Ambulatory Visit: Payer: Medicaid Other

## 2018-04-06 ENCOUNTER — Encounter: Payer: Self-pay | Admitting: Internal Medicine

## 2018-05-12 ENCOUNTER — Other Ambulatory Visit: Payer: Self-pay | Admitting: Internal Medicine

## 2018-05-12 ENCOUNTER — Other Ambulatory Visit: Payer: Self-pay | Admitting: Medical

## 2018-05-12 DIAGNOSIS — N529 Male erectile dysfunction, unspecified: Secondary | ICD-10-CM

## 2018-05-13 ENCOUNTER — Other Ambulatory Visit: Payer: Self-pay | Admitting: Family Medicine

## 2018-05-13 NOTE — Telephone Encounter (Signed)
[facility] is requesting to fill pt Chantix. Please advise [facility]

## 2018-07-23 ENCOUNTER — Telehealth: Payer: Self-pay | Admitting: Dietician

## 2018-07-23 NOTE — Telephone Encounter (Signed)
Reached out to patient to see if he would agree to an appointment with his PCP. He told me he is going to another doctors office now and just scheduled an appointment with them.

## 2018-07-23 NOTE — Telephone Encounter (Signed)
Not a problem, thank you for letting me know Butch Penny!

## 2018-08-09 ENCOUNTER — Other Ambulatory Visit: Payer: Self-pay | Admitting: Internal Medicine

## 2018-08-09 DIAGNOSIS — E118 Type 2 diabetes mellitus with unspecified complications: Principal | ICD-10-CM

## 2018-08-09 DIAGNOSIS — IMO0002 Reserved for concepts with insufficient information to code with codable children: Secondary | ICD-10-CM

## 2018-08-09 DIAGNOSIS — E1165 Type 2 diabetes mellitus with hyperglycemia: Secondary | ICD-10-CM

## 2018-08-11 ENCOUNTER — Encounter: Payer: BLUE CROSS/BLUE SHIELD | Admitting: Medical

## 2018-08-23 ENCOUNTER — Encounter: Payer: Medicaid Other | Admitting: Medical

## 2018-08-25 ENCOUNTER — Ambulatory Visit (INDEPENDENT_AMBULATORY_CARE_PROVIDER_SITE_OTHER): Payer: BLUE CROSS/BLUE SHIELD | Admitting: Medical

## 2018-08-25 ENCOUNTER — Encounter: Payer: Self-pay | Admitting: Medical

## 2018-08-25 VITALS — BP 130/90 | HR 74 | Temp 97.5°F | Resp 16 | Ht 72.5 in | Wt 240.6 lb

## 2018-08-25 DIAGNOSIS — Z7189 Other specified counseling: Secondary | ICD-10-CM

## 2018-08-25 DIAGNOSIS — Z7185 Encounter for immunization safety counseling: Secondary | ICD-10-CM

## 2018-08-25 DIAGNOSIS — E1159 Type 2 diabetes mellitus with other circulatory complications: Secondary | ICD-10-CM | POA: Diagnosis not present

## 2018-08-25 DIAGNOSIS — R809 Proteinuria, unspecified: Secondary | ICD-10-CM

## 2018-08-25 DIAGNOSIS — E785 Hyperlipidemia, unspecified: Secondary | ICD-10-CM

## 2018-08-25 DIAGNOSIS — Z720 Tobacco use: Secondary | ICD-10-CM

## 2018-08-25 DIAGNOSIS — I10 Essential (primary) hypertension: Secondary | ICD-10-CM | POA: Diagnosis not present

## 2018-08-25 DIAGNOSIS — E1165 Type 2 diabetes mellitus with hyperglycemia: Secondary | ICD-10-CM | POA: Diagnosis not present

## 2018-08-25 DIAGNOSIS — F411 Generalized anxiety disorder: Secondary | ICD-10-CM

## 2018-08-25 DIAGNOSIS — N529 Male erectile dysfunction, unspecified: Secondary | ICD-10-CM | POA: Diagnosis not present

## 2018-08-25 DIAGNOSIS — Z0000 Encounter for general adult medical examination without abnormal findings: Secondary | ICD-10-CM

## 2018-08-25 DIAGNOSIS — R9431 Abnormal electrocardiogram [ECG] [EKG]: Secondary | ICD-10-CM

## 2018-08-25 DIAGNOSIS — E669 Obesity, unspecified: Secondary | ICD-10-CM

## 2018-08-25 LAB — POCT URINALYSIS DIP (PROADVANTAGE DEVICE)
BILIRUBIN UA: NEGATIVE
BILIRUBIN UA: NEGATIVE mg/dL
Blood, UA: NEGATIVE
Glucose, UA: NEGATIVE mg/dL
Leukocytes, UA: NEGATIVE
Nitrite, UA: NEGATIVE
PH UA: 7 (ref 5.0–8.0)
SPECIFIC GRAVITY, URINE: 1.015
Urobilinogen, Ur: NEGATIVE

## 2018-08-25 NOTE — Addendum Note (Signed)
Addended by: Gwinda Maine on: 08/25/2018 01:33 PM   Modules accepted: Orders

## 2018-08-25 NOTE — Progress Notes (Signed)
Subjective:   HPI  Dustin Zamora is a 43 y.o. male who presents for Chief Complaint  Patient presents with  . CPE    fasting CPE     Medical care team includes: Shauntea Lok, Camelia Eng, PA-C here for primary care Dentist Eye doctor  Concerns: Here for physical.  Since last visit here back in 2018 he had transferred to another practice for a little while.  He has a medical history significant for diabetes, hypertension, hyperlipidemia, tobacco use, microalbuminuria.  When I last talked to him in 2018, he was taking lisinopril 10 mg daily, hydrochlorothiazide 25 mg daily, metformin 1000 mg twice daily, Pravachol 20 mg daily, Viagra 100 mg as needed, Zoloft 25 mg for anxiety.  Diabetes - has glucometer, only checks it when he thinks its high.   Using Humalog 10u BID with meals.    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   Says wife is the same as last visit, has anxiety related to this.  ED - requests refill on Sildenafil  Reviewed their medical, surgical, family, social, medication, and allergy history and updated chart as appropriate.  Past Medical History:  Diagnosis Date  . Anxiety   . Chronic back pain   . Depression    in remote past  . Diabetes mellitus without complication ([place]) 5974  . Hyperlipidemia 2008  . Hypertension 2008  . Keloid   . Microalbuminuria   . Smoker   . Wears glasses     Past Surgical History:  Procedure Laterality Date  . ANTERIOR CRUCIATE LIGAMENT REPAIR     right ACL repair, patellar repair, remove cartilage damage; 43yo    Social History   Socioeconomic History  . Marital status: Married    Spouse name: Not on file  . Number of children: Not on file  . Years of education: Not on file  . Highest education level: Not on file  Occupational History  . Not on file  Social Needs  . Financial resource strain: Not on file  . Food insecurity:    Worry: Not on file    Inability: Not on file  .  Transportation needs:    Medical: Not on file    Non-medical: Not on file  Tobacco Use  . Smoking status: Current Every Day Smoker    Packs/day: 0.50    Years: 15.00    Pack years: 7.50    Types: Cigars, Cigarettes  . Smokeless tobacco: Never Used  Substance and Sexual Activity  . Alcohol use: Yes    Alcohol/week: 4.0 standard drinks    Types: 4 Shots of liquor per week    Comment: occ  . Drug use: Yes    Types: Marijuana  . Sexual activity: Not on file  Lifestyle  . Physical activity:    Days per week: Not on file    Minutes per session: Not on file  . Stress: Not on file  Relationships  . Social connections:    Talks on phone: Not on file    Gets together: Not on file    Attends religious service: Not on file    Active member of club or organization: Not on file    Attends meetings of clubs or organizations: Not on file    Relationship status: Not on file  . Intimate partner violence:    Fear of current or ex partner: Not on file    Emotionally abused: Not on file    Physically abused:  Not on file    Forced sexual activity: Not on file  Other Topics Concern  . Not on file  Social History Narrative   Lives at home with wife Dustin Zamora, 2 daughters, owns [employer], does hardwood floors.   Exercise - goes to the gym, weights, some cardio    Family History  Problem Relation Age of Onset  . Kidney disease Father   . Vascular Disease Father   . Stroke Paternal Grandmother   . Diabetes Paternal Grandmother   . Cancer Neg Hx   . Heart disease Neg Hx      Current Outpatient Medications:  .  insulin lispro (HUMALOG KWIKPEN) 100 UNIT/ML KwikPen, Inject 10 Units into the skin., Disp: , Rfl:  .  acetaminophen (TYLENOL 8 HOUR) 650 MG CR tablet, Take 1 tablet (650 mg total) by mouth every 8 (eight) hours. (Patient not taking: Reported on 08/25/2018), Disp: 30 tablet, Rfl: 0 .  BD ULTRA-FINE LANCETS lancets, Use as instructed (Patient not taking: Reported on  08/25/2018), Disp: 100 each, Rfl: 12 .  hydrochlorothiazide (HYDRODIURIL) 25 MG tablet, Take 1 tablet (25 mg total) by mouth daily. (Patient not taking: Reported on 08/25/2018), Disp: 60 tablet, Rfl: 2 .  ibuprofen (ADVIL,MOTRIN) 600 MG tablet, Take 1 tablet (600 mg total) by mouth every 6 (six) hours as needed. (Patient not taking: Reported on 08/25/2018), Disp: 30 tablet, Rfl: 0 .  lisinopril (PRINIVIL,ZESTRIL) 10 MG tablet, Take 1 tablet (10 mg total) by mouth daily. (Patient not taking: Reported on 08/25/2018), Disp: 30 tablet, Rfl: 2 .  metFORMIN (GLUCOPHAGE) 1000 MG tablet, Take 1 tablet (1,000 mg total) by mouth 2 (two) times daily with a meal., Disp: 60 tablet, Rfl: 2 .  pravastatin (PRAVACHOL) 20 MG tablet, Take 1 tablet (20 mg total) by mouth daily. (Patient not taking: Reported on 08/25/2018), Disp: 90 tablet, Rfl: 3 .  sertraline (ZOLOFT) 25 MG tablet, Take 1 tablet (25 mg total) by mouth daily. (Patient not taking: Reported on 08/25/2018), Disp: 30 tablet, Rfl: 2 .  sildenafil (VIAGRA) 100 MG tablet, TAKE ONE TABLET BY MOUTH EVERY DAY AS NEEDED (Patient not taking: Reported on 08/25/2018), Disp: 10 tablet, Rfl: 1  Allergies  Allergen Reactions  . Penicillins Anaphylaxis    Has patient had a PCN reaction causing immediate rash, facial/tongue/throat swelling, SOB or lightheadedness with hypotension: Unknown Has patient had a PCN reaction causing severe rash involving mucus membranes or skin necrosis: Unknown Has patient had a PCN reaction that required hospitalization: Unknown Has patient had a PCN reaction occurring within the last 10 years: Unknown If all of the above answers are "NO", then may proceed with Cephalosporin use.        Review of Systems Constitutional: -fever, -chills, -sweats, -unexpected weight change, -decreased appetite, -fatigue Allergy: -sneezing, -itching, -congestion Dermatology: -changing moles, --rash, -lumps ENT: -runny nose, -ear pain, -sore throat,  -hoarseness, -sinus pain, -teeth pain, - ringing in ears, -hearing loss, -nosebleeds Cardiology: -chest pain, -palpitations, -swelling, -difficulty breathing when lying flat, -waking up short of breath Respiratory: -cough, -shortness of breath, -difficulty breathing with exercise or exertion, -wheezing, -coughing up blood Gastroenterology: -abdominal pain, -nausea, -vomiting, -diarrhea, -constipation, -blood in stool, -changes in bowel movement, -difficulty swallowing or eating Hematology: -bleeding, -bruising  Musculoskeletal: -joint aches, -muscle aches, -joint swelling, -back pain, -neck pain, -cramping, -changes in gait Ophthalmology: denies vision changes, eye redness, itching, discharge Urology: -burning with urination, -difficulty urinating, -blood in urine, -urinary frequency, -urgency, -incontinence Neurology: -headache, -weakness, -tingling, -  numbness, -memory loss, -falls, -dizziness Psychology: -depressed mood, -agitation, -sleep problems Male GU: no testicular mass, pain, no lymph nodes swollen, no swelling, no rash.     Objective:  BP 130/90   Pulse 74   Temp (!) 97.5 F (36.4 C) (Oral)   Resp 16   Ht 6' 0.5" (1.842 m)   Wt 240 lb 9.6 oz (109.1 kg)   SpO2 97%   BMI 32.18 kg/m   Wt Readings from Last 3 Encounters:  08/25/18 240 lb 9.6 oz (109.1 kg)  01/07/18 238 lb 14.4 oz (108.4 kg)  12/18/17 235 lb (106.6 kg)   BP Readings from Last 3 Encounters:  08/25/18 130/90  01/07/18 112/69  12/19/17 (!) 141/93    General appearance: alert, no distress, WD/WN, African American male Skin: no worrisome lesions HEENT: normocephalic, conjunctiva/corneas normal, sclerae anicteric, PERRLA, EOMi, nares patent, no discharge or erythema, pharynx normal Oral cavity: MMM, tongue normal, teeth in good repair Neck: supple, no lymphadenopathy, no thyromegaly, no masses, normal ROM, no bruits Chest: non tender, normal shape and expansion Heart: RRR, normal S1, S2, no murmurs Lungs:  CTA bilaterally, no wheezes, rhonchi, or rales Abdomen: +bs, soft, non tender, non distended, no masses, no hepatomegaly, no splenomegaly, no bruits Back: non tender, normal ROM, no scoliosis Musculoskeletal: +right anterior knee surgical scar, upper extremities non tender, no obvious deformity, normal ROM throughout, lower extremities non tender, no obvious deformity, normal ROM throughout Extremities: no edema, no cyanosis, no clubbing Pulses: 2+ symmetric, upper and lower extremities, normal cap refill Neurological: alert, oriented x 3, CN2-12 intact, strength normal upper extremities and lower extremities, sensation normal throughout, DTRs 2+ throughout, no cerebellar signs, gait normal Psychiatric: normal affect, behavior normal, pleasant  GU: normal male external genitalia,circumcised, nontender, no masses, no hernia, no lymphadenopathy Rectal: deferred  EKG Indication: Physical, high blood pressure, diabetes.  Ventricular rate 56 bpm, sinus bradycardia, PR interval 162 ms, QRS 102 ms, QTC 389 ms, -31 degrees axis, incomplete right bundle branch block, no significant change from 2018 EKG but still abnormal EKG   Diabetic Foot Exam - Simple   Simple Foot Form Diabetic Foot exam was performed with the following findings:  Yes 08/25/2018 11:54 AM  Visual Inspection No deformities, no ulcerations, no other skin breakdown bilaterally:  Yes Sensation Testing Intact to touch and monofilament testing bilaterally:  Yes Pulse Check Posterior Tibialis and Dorsalis pulse intact bilaterally:  Yes Comments     Assessment and Plan :   Encounter Diagnoses  Name Primary?  . Encounter for health maintenance examination in adult Yes  . Hypertension associated with diabetes ([place])   . Erectile dysfunction, unspecified erectile dysfunction type   . Hyperlipidemia, unspecified hyperlipidemia type   . Microalbuminuria   . Obesity without serious comorbidity, unspecified classification, unspecified  obesity type   . Tobacco use   . Generalized anxiety disorder   . Uncontrolled type 2 diabetes mellitus with hyperglycemia ([place])   . Vaccine counseling   . Abnormal EKG     Physical exam - discussed and counseled on healthy lifestyle, diet, exercise, preventative care, vaccinations, sick and well care, proper use of emergency dept and after hours care, and addressed their concerns.    Health screening: See your eye doctor yearly for routine vision care. See your dentist yearly for routine dental care including hygiene visits twice yearly.  Cancer screening Advised monthly self testicular exam  Colonoscopy: age 47 Discussed PSA, prostate exam, and prostate cancer screening risks/benefits.  Vaccinations: Advised yearly influenza vaccine Counseled on Pneumococcal vaccine  Separate significant chronic issues discussed: We discussed standard of care for diabetes, counseled on stopping smoking Pending labs we will get him back on some of his routine medicines that he has not been using We discussed the purpose for statin, ACE inhibitor, discussed risk of tobacco use Advised yearly eye doctor follow-up, EKG testing today for baseline recheck Discussed the need for regular glucose monitoring  Weaver was seen today for cpe.  Diagnoses and all orders for this visit:  Encounter for health maintenance examination in adult -     Comprehensive metabolic panel -     CBC -     TSH -     Lipid panel -     Hemoglobin A1c -     HM DIABETES EYE EXAM -     HM DIABETES FOOT EXAM -     Microalbumin / creatinine urine ratio -     EKG 12-Lead  Hypertension associated with diabetes (HCC) -     EKG 12-Lead  Erectile dysfunction, unspecified erectile dysfunction type  Hyperlipidemia, unspecified hyperlipidemia type -     Lipid panel -     EKG 12-Lead  Microalbuminuria  Obesity without serious comorbidity, unspecified classification, unspecified obesity type  Tobacco use  Generalized  anxiety disorder  Uncontrolled type 2 diabetes mellitus with hyperglycemia (HCC) -     Hemoglobin A1c -     HM DIABETES EYE EXAM -     HM DIABETES FOOT EXAM -     Microalbumin / creatinine urine ratio  Vaccine counseling  Abnormal EKG    Follow-up pending labs, yearly for physical

## 2018-08-26 ENCOUNTER — Other Ambulatory Visit: Payer: Self-pay | Admitting: Medical

## 2018-08-26 DIAGNOSIS — E1165 Type 2 diabetes mellitus with hyperglycemia: Secondary | ICD-10-CM

## 2018-08-26 DIAGNOSIS — IMO0002 Reserved for concepts with insufficient information to code with codable children: Secondary | ICD-10-CM

## 2018-08-26 DIAGNOSIS — N529 Male erectile dysfunction, unspecified: Secondary | ICD-10-CM

## 2018-08-26 DIAGNOSIS — E118 Type 2 diabetes mellitus with unspecified complications: Principal | ICD-10-CM

## 2018-08-26 LAB — CBC
HEMATOCRIT: 43.2 % (ref 37.5–51.0)
Hemoglobin: 15 g/dL (ref 13.0–17.7)
MCH: 31.8 pg (ref 26.6–33.0)
MCHC: 34.7 g/dL (ref 31.5–35.7)
MCV: 92 fL (ref 79–97)
PLATELETS: 280 10*3/uL (ref 150–450)
RBC: 4.71 x10E6/uL (ref 4.14–5.80)
RDW: 12.4 % (ref 11.6–15.4)
WBC: 5.9 10*3/uL (ref 3.4–10.8)

## 2018-08-26 LAB — LIPID PANEL
Chol/HDL Ratio: 4.6 ratio (ref 0.0–5.0)
Cholesterol, Total: 219 mg/dL — ABNORMAL HIGH (ref 100–199)
HDL: 48 mg/dL (ref >39)
LDL Calculated: 152 mg/dL — ABNORMAL HIGH (ref 0–99)
TRIGLYCERIDES: 97 mg/dL (ref 0–149)
VLDL Cholesterol Cal: 19 mg/dL (ref 5–40)

## 2018-08-26 LAB — COMPREHENSIVE METABOLIC PANEL
ALBUMIN: 4.2 g/dL (ref 4.0–5.0)
ALK PHOS: 71 IU/L (ref 39–117)
ALT: 14 IU/L (ref 0–44)
AST: 16 IU/L (ref 0–40)
Albumin/Globulin Ratio: 1.5 (ref 1.2–2.2)
BILIRUBIN TOTAL: 0.5 mg/dL (ref 0.0–1.2)
BUN / CREAT RATIO: 11 (ref 9–20)
BUN: 12 mg/dL (ref 6–24)
CO2: 27 mmol/L (ref 20–29)
Calcium: 9.4 mg/dL (ref 8.7–10.2)
Chloride: 97 mmol/L (ref 96–106)
Creatinine, Ser: 1.09 mg/dL (ref 0.76–1.27)
GFR calc Af Amer: 95 mL/min/{1.73_m2} (ref >59)
GFR calc non Af Amer: 82 mL/min/{1.73_m2} (ref >59)
GLUCOSE: 173 mg/dL — AB (ref 65–99)
Globulin, Total: 2.8 g/dL (ref 1.5–4.5)
Potassium: 4.6 mmol/L (ref 3.5–5.2)
SODIUM: 136 mmol/L (ref 134–144)
Total Protein: 7 g/dL (ref 6.0–8.5)

## 2018-08-26 LAB — MICROALBUMIN / CREATININE URINE RATIO
CREATININE, UR: 162.9 mg/dL
MICROALB/CREAT RATIO: 104 mg/g{creat} — AB (ref 0–29)
MICROALBUM., U, RANDOM: 169.3 ug/mL

## 2018-08-26 LAB — TSH: TSH: 1.59 u[IU]/mL (ref 0.450–4.500)

## 2018-08-26 LAB — HEMOGLOBIN A1C
ESTIMATED AVERAGE GLUCOSE: 217 mg/dL
HEMOGLOBIN A1C: 9.2 % — AB (ref 4.8–5.6)

## 2018-08-26 MED ORDER — DAPAGLIFLOZIN PROPANEDIOL 5 MG PO TABS: 5.0000 mg | ORAL_TABLET | Freq: Every day | ORAL | 2 refills | Status: DC

## 2018-08-26 MED ORDER — PRAVASTATIN SODIUM 20 MG PO TABS: 20.0000 mg | ORAL_TABLET | Freq: Every day | ORAL | 3 refills | Status: DC

## 2018-08-26 MED ORDER — SILDENAFIL CITRATE 100 MG PO TABS: 100.0000 mg | ORAL_TABLET | Freq: Every day | ORAL | 0 refills | Status: DC | PRN

## 2018-08-26 MED ORDER — METFORMIN HCL 1000 MG PO TABS: 1000.0000 mg | ORAL_TABLET | Freq: Two times a day (BID) | ORAL | 0 refills | Status: DC

## 2018-08-26 MED ORDER — LISINOPRIL 10 MG PO TABS: 10.0000 mg | ORAL_TABLET | Freq: Every day | ORAL | 3 refills | Status: DC

## 2018-09-28 ENCOUNTER — Ambulatory Visit: Payer: BLUE CROSS/BLUE SHIELD | Admitting: Medical

## 2018-10-05 ENCOUNTER — Other Ambulatory Visit: Payer: Self-pay

## 2018-10-05 ENCOUNTER — Ambulatory Visit (INDEPENDENT_AMBULATORY_CARE_PROVIDER_SITE_OTHER): Payer: BLUE CROSS/BLUE SHIELD | Admitting: Medical

## 2018-10-05 ENCOUNTER — Encounter: Payer: Self-pay | Admitting: Medical

## 2018-10-05 VITALS — BP 136/80 | HR 78 | Temp 98.4°F | Resp 16 | Ht 73.0 in | Wt 221.8 lb

## 2018-10-05 DIAGNOSIS — R109 Unspecified abdominal pain: Secondary | ICD-10-CM

## 2018-10-05 DIAGNOSIS — Z8719 Personal history of other diseases of the digestive system: Secondary | ICD-10-CM

## 2018-10-05 DIAGNOSIS — N529 Male erectile dysfunction, unspecified: Secondary | ICD-10-CM

## 2018-10-05 DIAGNOSIS — R112 Nausea with vomiting, unspecified: Secondary | ICD-10-CM

## 2018-10-05 DIAGNOSIS — R634 Abnormal weight loss: Secondary | ICD-10-CM

## 2018-10-05 DIAGNOSIS — E1165 Type 2 diabetes mellitus with hyperglycemia: Secondary | ICD-10-CM

## 2018-10-05 DIAGNOSIS — F1011 Alcohol abuse, in remission: Secondary | ICD-10-CM

## 2018-10-05 LAB — POCT URINALYSIS DIP (PROADVANTAGE DEVICE)
BILIRUBIN UA: NEGATIVE
BILIRUBIN UA: NEGATIVE mg/dL
Blood, UA: NEGATIVE
Glucose, UA: 100 mg/dL — AB
LEUKOCYTES UA: NEGATIVE
Nitrite, UA: NEGATIVE
PH UA: 8.5 — AB (ref 5.0–8.0)
Protein Ur, POC: 30 mg/dL — AB
Specific Gravity, Urine: 1.015
Urobilinogen, Ur: NEGATIVE

## 2018-10-05 MED ORDER — SILDENAFIL CITRATE 100 MG PO TABS: 100.0000 mg | ORAL_TABLET | Freq: Every day | ORAL | 2 refills | Status: DC | PRN

## 2018-10-05 MED ORDER — OMEPRAZOLE 40 MG PO CPDR: 40.0000 mg | DELAYED_RELEASE_CAPSULE | Freq: Every day | ORAL | 2 refills | Status: DC

## 2018-10-05 MED ORDER — HYDROCODONE-ACETAMINOPHEN 5-325 MG PO TABS: 1.0000 | ORAL_TABLET | Freq: Four times a day (QID) | ORAL | 0 refills | Status: DC | PRN

## 2018-10-05 NOTE — Addendum Note (Signed)
Addended by: Gwinda Maine on: 10/05/2018 10:34 AM   Modules accepted: Orders

## 2018-10-05 NOTE — Patient Instructions (Signed)
We will call with lab results  For now use the pain medicine as needed, do not eat any solid food for the next 24 to 48 hours  Hydrate with clear fluids throughout the day  We will call with lab results MRI likely have you go for CT scan pending labs   Acute Pancreatitis  Acute pancreatitis happens when the pancreas gets swollen. The pancreas is a large gland behind the stomach. The pancreas helps control blood sugar. It also makes enzymes that help digest food. This condition happens when the enzymes attack the pancreas and damage it. Most attacks last a couple of days and are dangerous. The lungs, heart, and kidneys may stop working. What are the causes?  Alcohol abuse.  Drug abuse.  Gallstones.  Some medicines.  Some chemicals.  Infection.  Damage caused by an accident.Dustin Zamora (abdominal) surgery.  In some cases, the cause is not known. What are the signs or symptoms?  Pain in the upper belly and back.  Swelling of the belly  Feeling sick to your stomach (nausea) and throwing up (vomiting). How is this treated?  You will probably have to stay in the hospital. ? Treatment may include:  Fluid through an IV.  A tube to remove stomach contents and stop you from throwing up.  Not eating for 3-4 days.  Pain medicine.  Antibiotic medicines if you have an infection.  Surgery on the pancreas or gallbladder. Follow these instructions at home: Eating and drinking   Follow instructions from your doctor about diet.  Eat small meals often. Avoid eating big meals.  Eat foods that do not have a lot of fat in them.  Drink enough fluid to keep your pee (urine) pale yellow.  Do not drink alcohol if it caused your condition. General instructions  Take over-the-counter and prescription medicines only as told by your doctor.  Do not use cigarettes, e-cigarettes, and chewing tobacco. If you need help quitting, ask your doctor.  Get plenty of rest.  If directed,  check your blood sugar at home as told by your doctor.  Keep all follow-up visits as told by your doctor. This is important. Contact a doctor if:  You do not get better as quickly as expected.  You have new symptoms.  Your symptoms get worse.  You have lasting pain or weakness.  You continue to feel sick to your stomach.  You get better and then you have another pain attack.  You have a fever. Get help right away if:  You cannot eat or keep fluids down.  Your pain becomes very bad.  Your skin or the white part of your eyes turns yellow.  You throw up.  You feel dizzy or you pass out.  Your blood sugar is high (over 300 mg/dL). Summary  Acute pancreatitis happens when the pancreas gets swollen.  This condition is usually caused by alcohol abuse, drug abuse, or gallstones.  You will probably have to stay in the hospital for treatment. This information is not intended to replace advice given to you by your health care provider. Make sure you discuss any questions you have with your health care provider. Document Released: 12/17/2007 Document Revised: 11/03/2016 Document Reviewed: 04/03/2015 Elsevier Interactive Patient Education  2019 [employer].

## 2018-10-05 NOTE — Progress Notes (Signed)
Subjective:     Patient ID: Dustin Zamora, male   DOB: [birth year], 43 y.o.   MRN: [id number]  HPI Chief Complaint  Patient presents with  . abdominal pain    abdominal pain X 2 weeks    Here for abdominal pain x 2 weeks.  He notes hx/o pancreatitis.  He notes he stopped drinking alcohol heavy a few months ago.  occasionally will drink a beer.   For a few weeks has had burning sensation in 1 spot.  Now hurting all over.  Worse RUQ.   Terrible pain x 1 week, but having pain for weeks.   Gets pain immediately after eating, but worse later in the night.  Feels like stomach is full of acid.   Gets nausea if eating.   Vomited once recently.    No recent fever, no blood in stool.   No back pain.   No dysuria, no urinary urgency, but does have some urinary frequency.   No blood in urine.   No diarrhea.     In last 48 hours ate a bag of potato chips.    Last pancreatitis 6- 7 months ago.   No recent travel.   Has a smokers cough, but no new cough, no new respiratory symptoms.     Needs refill on sildenafil.  In the past was prescribed #20 which is what he prefers.  Review of Systems As in subjective    Objective:   Physical Exam BP 136/80   Pulse 78   Temp 98.4 F (36.9 C) (Oral)   Resp 16   Ht 6\' 1"  (1.854 m)   Wt 221 lb 12.8 oz (100.6 kg)   SpO2 96%   BMI 29.26 kg/m   Wt Readings from Last 3 Encounters:  10/05/18 221 lb 12.8 oz (100.6 kg)  08/25/18 240 lb 9.6 oz (109.1 kg)  01/07/18 238 lb 14.4 oz (108.4 kg)   General appearance: alert, no distress, WD/WN,  Oral cavity: MMM, no lesions Neck: supple, no lymphadenopathy, no thyromegaly, no masses Heart: RRR, normal S1, S2, no murmurs Lungs: CTA bilaterally, no wheezes, rhonchi, or rales Abdomen: +bs, soft, tender RUQ, somewhat tender LUQ, fullness in RUQ, but no distinct mass, otherwise non tender, non distended, no hepatomegaly, no splenomegaly Pulses: 2+ symmetric, upper and lower extremities, normal cap refill Ext: no  edema No jaundice No back tenderness      Assessment:     Encounter Diagnoses  Name Primary?  . Abdominal pain, unspecified abdominal location Yes  . Erectile dysfunction, unspecified erectile dysfunction type   . Nausea and vomiting, intractability of vomiting not specified, unspecified vomiting type   . History of pancreatitis   . History of alcohol abuse   . Weight loss   . Uncontrolled type 2 diabetes mellitus with hyperglycemia (HCC)        Plan:     We discussed symptoms and concerns, prior history of alcohol abuse and pancreatitis.  For now advised no solid foods for the next 24 to 48 hours, hydrate lowercase fluids, bland pain medicine below, omeprazole below and follow-up pending labs.  Will likely pursue CT scan abdomen  Discussed differential that could include pancreatitis, cholecystitis, liver disease, ulcer, mass or other.  Erma was seen today for abdominal pain.  Diagnoses and all orders for this visit:  Abdominal pain, unspecified abdominal location -     Comprehensive metabolic panel -     Lipase  Erectile dysfunction, unspecified erectile dysfunction type -  sildenafil (VIAGRA) 100 MG tablet; Take 1 tablet (100 mg total) by mouth daily as needed.  Nausea and vomiting, intractability of vomiting not specified, unspecified vomiting type -     Comprehensive metabolic panel -     Lipase  History of pancreatitis  History of alcohol abuse  Weight loss  Uncontrolled type 2 diabetes mellitus with hyperglycemia (HCC)  Other orders -     HYDROcodone-acetaminophen (NORCO) 5-325 MG tablet; Take 1 tablet by mouth every 6 (six) hours as needed for moderate pain. -     omeprazole (PRILOSEC) 40 MG capsule; Take 1 capsule (40 mg total) by mouth daily.

## 2018-10-06 ENCOUNTER — Ambulatory Visit: Payer: BLUE CROSS/BLUE SHIELD | Admitting: Medical

## 2018-10-06 LAB — COMPREHENSIVE METABOLIC PANEL
A/G RATIO: 1.7 (ref 1.2–2.2)
ALBUMIN: 4.7 g/dL (ref 4.0–5.0)
ALT: 13 IU/L (ref 0–44)
AST: 10 IU/L (ref 0–40)
Alkaline Phosphatase: 76 IU/L (ref 39–117)
BUN / CREAT RATIO: 8 — AB (ref 9–20)
BUN: 10 mg/dL (ref 6–24)
CHLORIDE: 98 mmol/L (ref 96–106)
CO2: 28 mmol/L (ref 20–29)
CREATININE: 1.32 mg/dL — AB (ref 0.76–1.27)
Calcium: 10.2 mg/dL (ref 8.7–10.2)
GFR calc Af Amer: 75 mL/min/{1.73_m2} (ref >59)
GFR calc non Af Amer: 65 mL/min/{1.73_m2} (ref >59)
GLOBULIN, TOTAL: 2.7 g/dL (ref 1.5–4.5)
Glucose: 173 mg/dL — ABNORMAL HIGH (ref 65–99)
POTASSIUM: 5.2 mmol/L (ref 3.5–5.2)
SODIUM: 142 mmol/L (ref 134–144)
Total Protein: 7.4 g/dL (ref 6.0–8.5)

## 2018-10-06 LAB — LIPASE: Lipase: 54 U/L (ref 13–78)

## 2018-10-08 ENCOUNTER — Telehealth: Payer: Self-pay

## 2018-10-08 NOTE — Telephone Encounter (Signed)
Patient called back and wants to go for CT scan first available.   [place] [facility] is not doing any imaging at this time for 4-6 weeks.   Patient notified of that.

## 2018-10-11 NOTE — Telephone Encounter (Signed)
Given the recent findings, it may be helpful to have him take half dose of metformin for now and cut out [place] temporarily.  We added these recently to help get his sugars under better control.  However both of these medicines can cause some nausea and symptoms that could be related to his recent visit  Certainly I want him to hold off and avoid alcohol as this was an issue  Have him do the recommendations above for 7 to 10 days, then call back and let me know if he is improved even more or if he has seen any change?

## 2018-10-11 NOTE — Telephone Encounter (Signed)
See if he is having pain this morning.  If so, then call [facility] imaging to see if we can move forward with CT due to pain and weight loss.

## 2018-10-11 NOTE — Telephone Encounter (Signed)
Patient states that pain is better and he is having  bowel movement.

## 2018-10-12 NOTE — Telephone Encounter (Signed)
Patient notified of recommendations and states that he understands  

## 2018-11-17 ENCOUNTER — Other Ambulatory Visit: Payer: Self-pay | Admitting: Medical

## 2018-11-17 DIAGNOSIS — E118 Type 2 diabetes mellitus with unspecified complications: Principal | ICD-10-CM

## 2018-11-17 DIAGNOSIS — IMO0002 Reserved for concepts with insufficient information to code with codable children: Secondary | ICD-10-CM

## 2018-11-17 DIAGNOSIS — E1165 Type 2 diabetes mellitus with hyperglycemia: Secondary | ICD-10-CM

## 2018-12-10 ENCOUNTER — Other Ambulatory Visit: Payer: Self-pay | Admitting: Medical

## 2019-02-09 ENCOUNTER — Other Ambulatory Visit: Payer: Self-pay | Admitting: Medical

## 2019-02-09 DIAGNOSIS — E1165 Type 2 diabetes mellitus with hyperglycemia: Secondary | ICD-10-CM

## 2019-02-09 DIAGNOSIS — IMO0002 Reserved for concepts with insufficient information to code with codable children: Secondary | ICD-10-CM

## 2019-02-09 NOTE — Telephone Encounter (Signed)
Is this ok to refill?  

## 2019-02-09 NOTE — Telephone Encounter (Signed)
Get back in for med check or CPX.   Give #30 days worth if needed.

## 2019-02-10 NOTE — Telephone Encounter (Signed)
Left message on voicemail for patient to call and schedule CPE or fasting Med check.

## 2019-02-18 ENCOUNTER — Encounter: Payer: Self-pay | Admitting: Medical

## 2019-02-18 ENCOUNTER — Other Ambulatory Visit: Payer: Self-pay

## 2019-02-18 ENCOUNTER — Ambulatory Visit (INDEPENDENT_AMBULATORY_CARE_PROVIDER_SITE_OTHER): Payer: Managed Care, Other (non HMO) | Admitting: Medical

## 2019-02-18 VITALS — BP 140/96 | HR 90 | Temp 98.2°F | Resp 16 | Ht 73.5 in | Wt 221.0 lb

## 2019-02-18 DIAGNOSIS — F1011 Alcohol abuse, in remission: Secondary | ICD-10-CM | POA: Diagnosis not present

## 2019-02-18 DIAGNOSIS — R42 Dizziness and giddiness: Secondary | ICD-10-CM

## 2019-02-18 DIAGNOSIS — I10 Essential (primary) hypertension: Secondary | ICD-10-CM

## 2019-02-18 DIAGNOSIS — E1159 Type 2 diabetes mellitus with other circulatory complications: Secondary | ICD-10-CM | POA: Diagnosis not present

## 2019-02-18 DIAGNOSIS — I152 Hypertension secondary to endocrine disorders: Secondary | ICD-10-CM

## 2019-02-18 DIAGNOSIS — E1165 Type 2 diabetes mellitus with hyperglycemia: Secondary | ICD-10-CM | POA: Diagnosis not present

## 2019-02-18 DIAGNOSIS — R809 Proteinuria, unspecified: Secondary | ICD-10-CM

## 2019-02-18 DIAGNOSIS — E118 Type 2 diabetes mellitus with unspecified complications: Secondary | ICD-10-CM

## 2019-02-18 DIAGNOSIS — Z91199 Patient's noncompliance with other medical treatment and regimen due to unspecified reason: Secondary | ICD-10-CM

## 2019-02-18 DIAGNOSIS — F411 Generalized anxiety disorder: Secondary | ICD-10-CM

## 2019-02-18 DIAGNOSIS — Z9119 Patient's noncompliance with other medical treatment and regimen: Secondary | ICD-10-CM | POA: Insufficient documentation

## 2019-02-18 DIAGNOSIS — IMO0002 Reserved for concepts with insufficient information to code with codable children: Secondary | ICD-10-CM

## 2019-02-18 DIAGNOSIS — E785 Hyperlipidemia, unspecified: Secondary | ICD-10-CM

## 2019-02-18 LAB — POCT CBG (FASTING - GLUCOSE)-MANUAL ENTRY: Glucose Fasting, POC: 207 mg/dL — AB (ref 70–99)

## 2019-02-18 MED ORDER — FARXIGA 5 MG PO TABS: 5.0000 mg | ORAL_TABLET | Freq: Every day | ORAL | 2 refills | Status: DC

## 2019-02-18 MED ORDER — METFORMIN HCL 1000 MG PO TABS: 1000.0000 mg | ORAL_TABLET | Freq: Two times a day (BID) | ORAL | 0 refills | Status: DC

## 2019-02-18 NOTE — Progress Notes (Signed)
Pt states he will call back and schedule

## 2019-02-18 NOTE — Patient Instructions (Signed)
Recommendations I strongly recommend you take all of your medicines every single day without missing doses  I recommend you eat 3 meals a day, with fairly consistent quantities. Try to eat a carbohydrate along with a protein to stabilize your blood sugars. For example for breakfast you could do an omelette with a little cheese, or you can do a smoothly which has spinach, berries, yogurt or milk.  For lunch you can do a grilled chicken salad and a piece of fruit, or a small portion of whole-grain pasta with grilled chicken and a vegetable for example.  For blood pressure take lisinopril 10 mg every single day  For diabetes take metformin thousand grams twice daily but add back the Farxiga 5 mg daily.  This will not only help blood sugars but will help lower blood pressure indirectly and will help reduce risk of heart disease.  Make sure you drink a lot of water throughout the day as this medication does require increase water intake  Take the Pravachol cholesterol pill every day lower cholesterol lower risk of heart disease.   Drink plenty of water throughout the day at least 64 ounces water daily to avoid dehydration  Start checking your blood sugars daily fasting in the morning, and check your blood sugar anytime you feel lightheaded to make sure blood pressures not dropping below 70 which would be considered hypoglycemia.  Since your blood sugars are running high, if your blood sugar drop below 100 you actually might feel weak or lightheaded at this point.  It would be helpful to monitor blood pressures a couple times per week as well.  Goal is to gradually see your blood pressure doing down over the next 2 weeks to a goal of 130/80 or less.  If you start having new symptoms or continue to get this lightheaded symptom call back right away.  Plan to follow-up in 1 month otherwise

## 2019-02-18 NOTE — Progress Notes (Signed)
Subjective: Chief Complaint  Patient presents with  . BP    bp running high   Here for blood pressure running high.  He recently started a new job a month ago.    Recently at work felt lightheaded, someone checked his pressure, and it was 183/110.   He does not check BPs at home.   He is taking the Lisinopril 10mg , but only about 4 days per week.  Misses on average 3 days per week.  Forgets sometimes, sometimes leaves the house without his medication.     He is taking Metformin 1000mg  BID, is taking pravachol some, not daily.  Not taking farxiga after the weight loss and abdominal pain issues last visit.  He held off on this given the symptoms he was having last visit.  Still taking Omeprzaole sometimes.    Not drinking alcohol of late.   Not checking glucose, but has a glucometer.   He does note that he skips meals often.  Today he felt lightheaded he had eaten a small amount for breakfast and only had a few nuts and raisin mix just before lunchtime.  He felt lightheaded around the same time.  So he had went all morning without much in the way of food.  He is not checking blood sugars we have no idea whether his sugar was low or high that day.   He denies chest pain, edema, shortness of breath.  No syncope.  Past Medical History:  Diagnosis Date  . Anxiety   . Chronic back pain   . Depression    in remote past  . Diabetes mellitus without complication ([place]) 0932  . Hyperlipidemia 2008  . Hypertension 2008  . Keloid   . Microalbuminuria   . Smoker   . Wears glasses    Current Outpatient Medications on File Prior to Visit  Medication Sig Dispense Refill  . lisinopril (PRINIVIL,ZESTRIL) 10 MG tablet Take 1 tablet (10 mg total) by mouth daily. 90 tablet 3  . omeprazole (PRILOSEC) 40 MG capsule TAKE 1 CAPSULE BY MOUTH EVERY DAY 30 capsule 2  . pravastatin (PRAVACHOL) 20 MG tablet Take 1 tablet (20 mg total) by mouth daily. 90 tablet 3  . sildenafil (VIAGRA) 100 MG tablet Take 1 tablet  (100 mg total) by mouth daily as needed. 20 tablet 2   No current facility-administered medications on file prior to visit.       Objective: BP (!) 140/96   Pulse 90   Temp 98.2 F (36.8 C) (Temporal)   Resp 16   Ht 6' 1.5" (1.867 m)   Wt 221 lb (100.2 kg)   SpO2 98%   BMI 28.76 kg/m   BP Readings from Last 3 Encounters:  02/18/19 (!) 140/96  10/05/18 136/80  08/25/18 130/90   Wt Readings from Last 3 Encounters:  02/18/19 221 lb (100.2 kg)  10/05/18 221 lb 12.8 oz (100.6 kg)  08/25/18 240 lb 9.6 oz (109.1 kg)   General appearence: alert, no distress, WD/WN, marijuana odor HEENT: normocephalic, sclerae anicteric, TMs pearly, nares patent, no discharge or erythema, pharynx normal Oral cavity: MMM, no lesions Neck: supple, no lymphadenopathy, no thyromegaly, no masses, no bruits Heart: RRR, normal S1, S2, no murmurs Lungs: CTA bilaterally, no wheezes, rhonchi, or rales Abdomen: +bs, soft, non tender, non distended, no masses, no hepatomegaly, no splenomegaly Pulses: 2+ symmetric, upper and lower extremities, normal cap refill Ext: no edema Neuro: CN II through XII intact, normal strength and sensation, nonfocal exam  Assessment: Encounter Diagnoses  Name Primary?  Marland Kitchen Uncontrolled type 2 diabetes mellitus with hyperglycemia ([place]) Yes  . Hypertension associated with diabetes ([place])   . Generalized anxiety disorder   . History of alcohol abuse   . Hyperlipidemia, unspecified hyperlipidemia type   . Microalbuminuria   . Noncompliance   . Lightheaded   . Uncontrolled type 2 diabetes mellitus with complication, without long-term current use of insulin ([place])       Plan: We discussed his symptoms and concerns.  Fasting glucose today over 200.  He voices noncompliance with medications, only taking his blood pressure pill 4 days/week, certainly not taking statin every day.  He is not checking blood sugars or blood pressures at home although he has a glucometer.    In  08/2018, there was evidence of microalbumiuria, there was protein on urine dipstick, and creatinine was elevated.   He declines EKG today.  Declines cardiology referral.      Recommendations I strongly recommend you take all of your medicines every single day without missing doses  I recommend you eat 3 meals a day, with fairly consistent quantities. Try to eat a carbohydrate along with a protein to stabilize your blood sugars. For example for breakfast you could do an omelette with a little cheese, or you can do a smoothly which has spinach, berries, yogurt or milk.  For lunch you can do a grilled chicken salad and a piece of fruit, or a small portion of whole-grain pasta with grilled chicken and a vegetable for example.  For blood pressure take lisinopril 10 mg every single day  For diabetes take metformin thousand grams twice daily but add back the Farxiga 5 mg daily.  This will not only help blood sugars but will help lower blood pressure indirectly and will help reduce risk of heart disease.  Make sure you drink a lot of water throughout the day as this medication does require increase water intake  Take the Pravachol cholesterol pill every day lower cholesterol lower risk of heart disease.   Marcia was seen today for bp.  Diagnoses and all orders for this visit:  Uncontrolled type 2 diabetes mellitus with hyperglycemia ([place]) -     Glucose (CBG), Fasting  Hypertension associated with diabetes ([place])  Generalized anxiety disorder  History of alcohol abuse  Hyperlipidemia, unspecified hyperlipidemia type  Microalbuminuria  Noncompliance  Lightheaded  Uncontrolled type 2 diabetes mellitus with complication, without long-term current use of insulin ([facility]) -     metFORMIN (GLUCOPHAGE) 1000 MG tablet; Take 1 tablet (1,000 mg total) by mouth 2 (two) times daily with a meal.  Other orders -     dapagliflozin propanediol (FARXIGA) 5 MG TABS tablet; Take 5 mg by mouth daily.  f/u 1  month

## 2019-02-25 ENCOUNTER — Other Ambulatory Visit: Payer: Self-pay

## 2019-02-25 ENCOUNTER — Ambulatory Visit: Payer: Managed Care, Other (non HMO) | Admitting: Family Medicine

## 2019-02-25 ENCOUNTER — Encounter: Payer: Self-pay | Admitting: Family Medicine

## 2019-02-25 VITALS — BP 140/70 | Ht 73.5 in | Wt 220.0 lb

## 2019-02-25 DIAGNOSIS — R42 Dizziness and giddiness: Secondary | ICD-10-CM | POA: Diagnosis not present

## 2019-02-25 DIAGNOSIS — E1165 Type 2 diabetes mellitus with hyperglycemia: Secondary | ICD-10-CM | POA: Diagnosis not present

## 2019-02-25 DIAGNOSIS — I10 Essential (primary) hypertension: Secondary | ICD-10-CM

## 2019-02-25 DIAGNOSIS — E1159 Type 2 diabetes mellitus with other circulatory complications: Secondary | ICD-10-CM

## 2019-02-25 DIAGNOSIS — R0789 Other chest pain: Secondary | ICD-10-CM | POA: Diagnosis not present

## 2019-02-25 DIAGNOSIS — I152 Hypertension secondary to endocrine disorders: Secondary | ICD-10-CM

## 2019-02-25 DIAGNOSIS — R200 Anesthesia of skin: Secondary | ICD-10-CM

## 2019-02-25 DIAGNOSIS — Z9119 Patient's noncompliance with other medical treatment and regimen: Secondary | ICD-10-CM

## 2019-02-25 DIAGNOSIS — Z91199 Patient's noncompliance with other medical treatment and regimen due to unspecified reason: Secondary | ICD-10-CM

## 2019-02-25 NOTE — Progress Notes (Signed)
Subjective:  Documentation for virtual telephone encounter. He does not have video capability.   The patient was located at home. 2 patient identifiers used.  The provider was located in the office. The patient did consent to this visit and is aware of possible charges through their insurance for this visit.  The other persons participating in this telemedicine service were his wife.     Patient ID: Dustin Zamora, male    DOB: 11/02/1975, 43 y.o.   MRN: [id number]  HPI Chief Complaint  Patient presents with  . Hypertension    bp was 90/60, felt like he was going to faint, hand numbness wants to see Cardiologist   Patient did not answer the first time I called him. The next time I called he requested that he include his wife via 3-way phone call which I agreed to.   States yesterday afternoon he felt lightheaded so he checked his BP and it was 90/60. States he also had chest tightness and left arm numbness. He did not go to the ED for evaluation. The chest tightness and arm numbness resolved spontaneously and he has not had these symptoms since.  States today he feels better but still has mild lightheadedness.  Denies having chest pain, palpitations, shortness of breath, N/V or numbness, tingling or weakness today.   States he took a lisinopril yesterday morning along with his diabetes medications. He only recently started back on HTN medication.  States he was working in a hot environment and sweating and not well-hydrated.  He did not check his blood sugar.   States he saw his PCP last week and it was recommended he have an ECG and referral to cardiology and he declined. States he regrets not having this done.   Reviewed allergies, medications, past medical, surgical, family, and social history.   Review of Systems Pertinent positives and negatives in the history of present illness.     Objective:   Physical Exam BP 140/70 Comment: last night  Ht 6' 1.5" (1.867 m)   Wt 220  lb (99.8 kg)   BMI 28.63 kg/m   Alert and oriented and in no acute distress.  Respirations unlabored.  Speaking in complete sentences without difficulty.  Denies chest pain, numbness, weakness or shortness of breath.  Unable to further examine due to this being a virtual visit.      Assessment & Plan:  Hypertension associated with diabetes ([place]) - Plan: Ambulatory referral to Cardiology, concerns today regarding BP yesterday of 90/60 at home and near syncope. He was not hydrated and working in a hot environment which most likely played a role. BP responded to 140/70 yesterday evening and no longer symptomatic. He has not taken lisinopril today.  He has not checked his blood sugar.  Strongly encouraged patient and his wife who was on the call that if his symptoms return, he should seek immediate care either by calling 911 or going to the closest ED. He verbalized understanding.  Review of notes from his PCP last week state that the patient declined EKG and referral to cardiology. Today he requests referral to cardiology and does not want to come in the office for evaluation.  I will put in referral but I also advised that he should follow up with Audelia Acton, his PCP next week.   Uncontrolled type 2 diabetes mellitus with hyperglycemia ([place]) - Plan: encouraged him to check his blood sugar daily and especially if he feels lightheaded.   Lightheaded - Plan: encouraged  him to hydrate. Urine should be a very light yellow. Check blood sugar regularly. Eat protein and avoid skipping meals. Follow up with his PCP as this appears to be a chronic ongoing issue.   Chest tightness - Plan: Ambulatory referral to Cardiology. No active chest pain today. He does not want to come in the office today. He and his wife request referral to cardiology which his PCP Audelia Acton attempted to do last week but patient declined. Advised that if he has chest pain again that he will need to call 911 or go to the ED.   Left arm numbness  - Plan: resolved.   Personal history of noncompliance with medical treatment, presenting hazards to health - Plan: discussed potential long term consequences associated with ignoring health and untreated HTN, diabetes. Discussed that he has multiple risk factors for heart disease.   Time spent on call was 27 minutes and in review of previous records 4 minutes total.  This virtual service is not related to other E/M service within previous 7 days.

## 2019-03-10 NOTE — Progress Notes (Signed)
Cardiology Office Note:   Date:  03/11/2019  NAME:  Dustin Zamora    MRN: [id number] DOB:  [birth year]   PCP:  Carlena Hurl, PA-C  Cardiologist:  Evalina Field, MD   Referring MD: Girtha Rm, NP-C   Chief Complaint  Patient presents with  . Dizziness   History of Present Illness:   Dustin Zamora is a 43 y.o. male with a hx of diabetes, hypertension who is being seen today for the evaluation of dizziness at the request of Harland Dingwall, NP.  He reports 2 months of exertional dizziness, chest tightness, palpitations.  He reports he recently started a new job at the [employer] and this involves 12-hour shifts with pretty strenuous activity, including moving pallets and large boxes.  He reports a period of inactivity due to coronavirus pandemic, but prior to this was going to the gym and playing basketball without limitations.  He states since her new job or with exertional activity outside of the job he does get a sensation of rapid heartbeat, palpitations, fatigue.  He reports an episode of lightheadedness on the job that was associated with a systolic blood pressure [id number].  He reports he feels like he is going to pass out but has not passed out.  He states some of his symptoms occur intermittently, and that he is able to climb his stairs at home without limitations at times but at times he also has a sensation of shortness of breath.  His cardiovascular disease risk factors include diabetes with an A1c of 9.2, half pack cigarettes daily, high blood pressure, and a family history of strokes.  LDL cholesterol 152.  His ECG also shows a possible old anteroseptal infarct.1  Past Medical History: Past Medical History:  Diagnosis Date  . Anxiety   . Chronic back pain   . Depression    in remote past  . Diabetes mellitus without complication ([place]) [id number]  . Hyperlipidemia 2008  . Hypertension 2008  . Keloid   . Microalbuminuria   . Smoker   . Wears glasses     Past Surgical  History: Past Surgical History:  Procedure Laterality Date  . ANTERIOR CRUCIATE LIGAMENT REPAIR     right ACL repair, patellar repair, remove cartilage damage; 43yo    Current Medications: Current Meds  Medication Sig  . dapagliflozin propanediol (FARXIGA) 5 MG TABS tablet Take 5 mg by mouth daily.  Marland Kitchen lisinopril (PRINIVIL,ZESTRIL) 10 MG tablet Take 1 tablet (10 mg total) by mouth daily.  . metFORMIN (GLUCOPHAGE) 1000 MG tablet Take 1 tablet (1,000 mg total) by mouth 2 (two) times daily with a meal.  . omeprazole (PRILOSEC) 40 MG capsule TAKE 1 CAPSULE BY MOUTH EVERY DAY  . pravastatin (PRAVACHOL) 20 MG tablet Take 1 tablet (20 mg total) by mouth daily.  . sildenafil (VIAGRA) 100 MG tablet Take 1 tablet (100 mg total) by mouth daily as needed.     Allergies:    Penicillins   Social History: Social History   Socioeconomic History  . Marital status: Married    Spouse name: Not on file  . Number of children: Not on file  . Years of education: Not on file  . Highest education level: Not on file  Occupational History  . Not on file  Social Needs  . Financial resource strain: Not on file  . Food insecurity    Worry: Not on file    Inability: Not on file  . Transportation needs  Medical: Not on file    Non-medical: Not on file  Tobacco Use  . Smoking status: Current Every Day Smoker    Packs/day: 0.50    Years: 25.00    Pack years: 12.50    Types: Cigars, Cigarettes  . Smokeless tobacco: Never Used  Substance and Sexual Activity  . Alcohol use: Not Currently    Alcohol/week: 4.0 standard drinks    Types: 4 Shots of liquor per week    Comment: occ  . Drug use: Yes    Frequency: 7.0 times per week    Types: Marijuana  . Sexual activity: Not on file  Lifestyle  . Physical activity    Days per week: Not on file    Minutes per session: Not on file  . Stress: Not on file  Relationships  . Social Herbalist on phone: Not on file    Gets together: Not on  file    Attends religious service: Not on file    Active member of club or organization: Not on file    Attends meetings of clubs or organizations: Not on file    Relationship status: Not on file  Other Topics Concern  . 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   Exercise - goes to the gym, weights, some cardio     Family History: The patient's family history includes Diabetes in his paternal grandmother; Kidney disease in his father; Stroke in his paternal grandmother; Vascular Disease in his father. There is no history of Cancer or Heart disease.  ROS:   All other ROS reviewed and negative. Pertinent positives noted in the HPI.     EKGs/Labs/Other Studies Reviewed:   The following studies were personally reviewed by me today: LDL 152, HDL 48, A1c 9.2, serum creatinine 1.32, TSH 1.59  EKG:  EKG is hard ordered today.  The ekg ordered today demonstrates sinus bradycardia, heart rate 48, normal intervals, possible old anterior septal infarct, no acute ST-T changes., and was personally reviewed by me.   Recent Labs: 08/25/2018: Hemoglobin 15.0; Platelets 280; TSH 1.590 10/05/2018: ALT 13; BUN 10; Creatinine, Ser 1.32; Potassium 5.2; Sodium 142   Recent Lipid Panel    Component Value Date/Time   CHOL 219 (H) 08/25/2018 1210   TRIG 97 08/25/2018 1210   HDL 48 08/25/2018 1210   CHOLHDL 4.6 08/25/2018 1210   CHOLHDL 3.1 05/12/2017 1140   VLDL 43 (H) 08/05/2016 1121   LDLCALC 152 (H) 08/25/2018 1210   LDLCALC 103 (H) 05/12/2017 1140    Physical Exam:   VS:  BP 128/70   Pulse (!) 48   Temp 97.9 F (36.6 C) (Temporal)   Ht 6\' 1"  (1.854 m)   Wt 224 lb 12.8 oz (102 kg)   SpO2 98%   BMI 29.66 kg/m    Wt Readings from Last 3 Encounters:  03/11/19 224 lb 12.8 oz (102 kg)  02/25/19 220 lb (99.8 kg)  02/18/19 221 lb (100.2 kg)    General: Well nourished, well developed, in no acute  distress Heart: Atraumatic, normal size  Eyes: PEERLA, EOMI  Neck: Supple, no JVD Endocrine: No thryomegaly Cardiac: Normal S1, S2; RRR; no murmurs, rubs, or gallops Lungs: Clear to auscultation bilaterally, no wheezing, rhonchi or rales  Abd: Soft, nontender, no hepatomegaly  Ext: No edema, pulses 2+ Musculoskeletal: No deformities, BUE and BLE strength normal and  equal Skin: Warm and dry, no rashes   Neuro: Alert and oriented to person, place, time, and situation, CNII-XII grossly intact, no focal deficits  Psych: Normal mood and affect   ASSESSMENT:   NAME@ is a 43 y.o. male who presents for the following: 1. Lightheaded   2. Precordial pain   3. Essential hypertension   4. Tobacco abuse     PLAN:   1. Lightheaded 2. Precordial pain -Atypical symptoms which could be related to deconditioning.  However given that he has diabetes with an A1c of 9.2 and significant risk factors including tobacco abuse and high blood pressure, I am concerned for underlying coronary disease. -His ECG likely shows LVH and this will preclude him from a plain exercise treadmill test -We will obtain an exercise Myoview nuclear medicine MPI study -We will likely switch him to Crestor at his next visit and shoot for an LDL goal less than 70  3. Essential hypertension -Blood pressures well controlled today we will continue current regimen  4. Tobacco abuse -Counseled on smoking cessation today  Disposition: Return in about 3 months (around 06/11/2019).  Medication Adjustments/Labs and Tests Ordered: Current medicines are reviewed at length with the patient today.  Concerns regarding medicines are outlined above.  Orders Placed This Encounter  Procedures  . MYOCARDIAL PERFUSION IMAGING  . EKG 12-Lead   No orders of the defined types were placed in this encounter.   Patient Instructions  Medication Instructions:   No changes   If you need a refill on your cardiac medications before your next  appointment, please call your pharmacy.   Lab work: Not needed  Testing/Procedures: Will be schedule at [facility] [address] You will need to have a covid test 3 days prior to test  , then self isolate until test is complete- [facility] [address] [facility] Your physician has requested that you have en exercise stress myoview. For further information please visit [email or web address]. Please follow instruction sheet, as given.   Follow-Up: At [facility], you and your health needs are our priority.  As part of our continuing mission to provide you with exceptional heart care, we have created designated Provider Care Teams.  These Care Teams include your primary Cardiologist (physician) and Advanced Practice Providers (APPs -  Physician Assistants and Nurse Practitioners) who all work together to provide you with the care you need, when you need it. . Dr Audie Box recommends that you schedule a follow-up appointment in 3 months -Nov 2020     Any Other Special Instructions Will Be Listed Below (If Applicable).    Signed, Addison Naegeli. Audie Box, [place]  [address] [place], [place] [address] [phone]  03/11/2019 8:46 AM

## 2019-03-11 ENCOUNTER — Ambulatory Visit (INDEPENDENT_AMBULATORY_CARE_PROVIDER_SITE_OTHER): Payer: Managed Care, Other (non HMO) | Admitting: Cardiovascular Disease

## 2019-03-11 ENCOUNTER — Other Ambulatory Visit: Payer: Self-pay

## 2019-03-11 ENCOUNTER — Encounter: Payer: Self-pay | Admitting: Cardiovascular Disease

## 2019-03-11 VITALS — BP 128/70 | HR 48 | Temp 97.9°F | Ht 73.0 in | Wt 224.8 lb

## 2019-03-11 DIAGNOSIS — I10 Essential (primary) hypertension: Secondary | ICD-10-CM

## 2019-03-11 DIAGNOSIS — R072 Precordial pain: Secondary | ICD-10-CM

## 2019-03-11 DIAGNOSIS — R42 Dizziness and giddiness: Secondary | ICD-10-CM | POA: Diagnosis not present

## 2019-03-11 DIAGNOSIS — Z720 Tobacco use: Secondary | ICD-10-CM

## 2019-03-11 NOTE — Patient Instructions (Addendum)
Medication Instructions:   No changes   If you need a refill on your cardiac medications before your next appointment, please call your pharmacy.   Lab work: Not needed  Testing/Procedures: Will be schedule at [facility] [address] You will need to have a covid test 3 days prior to test  , then self isolate until test is complete- you will go to [address] [facility] Your physician has requested that you have en exercise stress myoview. For further information please visit [email or web address]. Please follow instruction sheet, as given.   Follow-Up: At [facility], you and your health needs are our priority.  As part of our continuing mission to provide you with exceptional heart care, we have created designated Provider Care Teams.  These Care Teams include your primary Cardiologist (physician) and Advanced Practice Providers (APPs -  Physician Assistants and Nurse Practitioners) who all work together to provide you with the care you need, when you need it. . Dr Audie Box recommends that you schedule a follow-up appointment in 3 months -Nov 2020     Any Other Special Instructions Will Be Listed Below (If Applicable).

## 2019-03-12 ENCOUNTER — Inpatient Hospital Stay (HOSPITAL_COMMUNITY): Admission: RE | Admit: 2019-03-12 | Payer: Managed Care, Other (non HMO) | Source: Ambulatory Visit

## 2019-03-14 ENCOUNTER — Other Ambulatory Visit (HOSPITAL_COMMUNITY)
Admission: RE | Admit: 2019-03-14 | Discharge: 2019-03-14 | Disposition: A | Discharge status: Home / Self Care | Payer: Managed Care, Other (non HMO) | Source: Ambulatory Visit | Attending: Cardiovascular Disease | Admitting: Cardiovascular Disease

## 2019-03-14 ENCOUNTER — Telehealth (HOSPITAL_COMMUNITY): Payer: Self-pay | Admitting: *Deleted

## 2019-03-14 DIAGNOSIS — Z01812 Encounter for preprocedural laboratory examination: Secondary | ICD-10-CM | POA: Diagnosis not present

## 2019-03-14 DIAGNOSIS — Z20828 Contact with and (suspected) exposure to other viral communicable diseases: Secondary | ICD-10-CM | POA: Diagnosis not present

## 2019-03-14 LAB — SARS CORONAVIRUS 2 (TAT 6-24 HRS): SARS Coronavirus 2: NEGATIVE

## 2019-03-14 NOTE — Telephone Encounter (Signed)
Left message on voicemail per DPR in reference to upcoming appointment scheduled on 03/16/19 with detailed instructions given per Myocardial Perfusion Study Information Sheet for the test. LM to arrive 15 minutes early, and that it is imperative to arrive on time for appointment to keep from having the test rescheduled. If you need to cancel or reschedule your appointment, please call the office within 24 hours of your appointment. Failure to do so may result in a cancellation of your appointment, and a $50 no show fee. Phone number given for call back for any questions. Kirstie Peri

## 2019-03-16 ENCOUNTER — Ambulatory Visit (HOSPITAL_COMMUNITY): Payer: Managed Care, Other (non HMO) | Attending: Cardiovascular Disease

## 2019-03-16 ENCOUNTER — Other Ambulatory Visit: Payer: Self-pay

## 2019-03-16 DIAGNOSIS — R072 Precordial pain: Secondary | ICD-10-CM | POA: Insufficient documentation

## 2019-03-16 DIAGNOSIS — R42 Dizziness and giddiness: Secondary | ICD-10-CM | POA: Insufficient documentation

## 2019-03-16 LAB — MYOCARDIAL PERFUSION IMAGING
Estimated workload: 13.4 METS
Exercise duration (min): 10 min
LV dias vol: 163 mL (ref 62–150)
LV sys vol: 73 mL
MPHR: 175 {beats}/min
Peak HR: 160 {beats}/min
Percent HR: 91 %
RPE: 18
Rest HR: 77 {beats}/min
SDS: 1
SRS: 0
SSS: 1
TID: 1.11

## 2019-03-16 MED ORDER — TECHNETIUM TC 99M TETROFOSMIN IV KIT
32.9000 | PACK | Freq: Once | INTRAVENOUS | Status: AC | PRN
  Administered 2019-03-16: 32.9 via INTRAVENOUS
  Filled 2019-03-16: qty 33

## 2019-03-16 MED ORDER — TECHNETIUM TC 99M TETROFOSMIN IV KIT
11.0000 | PACK | Freq: Once | INTRAVENOUS | Status: AC | PRN
  Administered 2019-03-16: 11 via INTRAVENOUS
  Filled 2019-03-16: qty 11

## 2019-03-18 ENCOUNTER — Telehealth: Payer: Self-pay | Admitting: *Deleted

## 2019-03-18 NOTE — Telephone Encounter (Signed)
-----   Message from Geralynn Rile, MD sent at 03/17/2019 10:57 AM EDT ----- Please let Mr.Tengan know his stress test is normal. Given his exercise capacity, his heart is no issue.   Evalina Field, MD

## 2019-03-18 NOTE — Telephone Encounter (Signed)
Left detailed message of stress myoview results  Any question may call back

## 2019-05-10 ENCOUNTER — Other Ambulatory Visit: Payer: Self-pay | Admitting: Medical

## 2019-05-10 DIAGNOSIS — N529 Male erectile dysfunction, unspecified: Secondary | ICD-10-CM

## 2019-05-16 ENCOUNTER — Telehealth: Payer: Self-pay

## 2019-05-16 NOTE — Telephone Encounter (Signed)
Do you approve patient to be referred to GI?

## 2019-05-16 NOTE — Telephone Encounter (Signed)
He is due back for diabetes f/u.  Get in for fasting med check and we can discuss this further.

## 2019-05-16 NOTE — Telephone Encounter (Signed)
Pt. Called stating he saw Dustin Zamora for a stomach issue and Dustin Zamora wanted to refer him then to GI, per pt. He did not want referral at that time. But now the stomach issues are coming back since about 2 weeks ago, pt. Has stomach pain and acid, pt. Would now like the GI referral.

## 2019-05-17 NOTE — Telephone Encounter (Signed)
Appointment has been scheduled.

## 2019-05-19 ENCOUNTER — Encounter: Payer: Managed Care, Other (non HMO) | Admitting: Medical

## 2019-05-23 ENCOUNTER — Encounter: Payer: Self-pay | Admitting: Medical

## 2019-05-23 ENCOUNTER — Ambulatory Visit: Payer: Managed Care, Other (non HMO) | Admitting: Medical

## 2019-05-23 ENCOUNTER — Other Ambulatory Visit: Payer: Self-pay

## 2019-05-23 VITALS — BP 120/76 | HR 85 | Temp 97.8°F | Ht 73.0 in | Wt 227.0 lb

## 2019-05-23 DIAGNOSIS — R112 Nausea with vomiting, unspecified: Secondary | ICD-10-CM | POA: Diagnosis not present

## 2019-05-23 DIAGNOSIS — F419 Anxiety disorder, unspecified: Secondary | ICD-10-CM | POA: Insufficient documentation

## 2019-05-23 DIAGNOSIS — E1165 Type 2 diabetes mellitus with hyperglycemia: Secondary | ICD-10-CM

## 2019-05-23 DIAGNOSIS — I10 Essential (primary) hypertension: Secondary | ICD-10-CM

## 2019-05-23 DIAGNOSIS — E1159 Type 2 diabetes mellitus with other circulatory complications: Secondary | ICD-10-CM

## 2019-05-23 DIAGNOSIS — E785 Hyperlipidemia, unspecified: Secondary | ICD-10-CM | POA: Diagnosis not present

## 2019-05-23 DIAGNOSIS — R109 Unspecified abdominal pain: Secondary | ICD-10-CM

## 2019-05-23 DIAGNOSIS — Z125 Encounter for screening for malignant neoplasm of prostate: Secondary | ICD-10-CM

## 2019-05-23 DIAGNOSIS — Z23 Encounter for immunization: Secondary | ICD-10-CM | POA: Insufficient documentation

## 2019-05-23 DIAGNOSIS — Z720 Tobacco use: Secondary | ICD-10-CM

## 2019-05-23 MED ORDER — BUPROPION HCL ER (XL) 150 MG PO TB24: 150.0000 mg | ORAL_TABLET | Freq: Every day | ORAL | 1 refills | Status: DC

## 2019-05-23 NOTE — Progress Notes (Signed)
Subjective:  Dustin Zamora is a 43 y.o. male who presents for Chief Complaint  Patient presents with  . Medication Management    med check      Here for med check.  Diabetes-checking sugars sometimes.  Blood sugars can range from pretty good today in the 200s.  No foot concerns.  No polydipsia no polyuria.  He is continued get some abdominal pains in town time.  Worse in the upper belly.  Pains can last for hours to days.  Recently after drinking 1 beer had pain for 3 weeks.  Some nausea, some vomiting.  No blood in the stool.  Pain was more in the middle of the upper belly.  No urinary complaints.  Compliant with cholesterol medicine but not taking it every single day.  Hypertension-compliant with blood pressure pills daily  Would like to start Wellbutrin.  He has tried Chantix in the past but did not tolerate it.  Has heard good things about Wellbutrin and feels anxious all the time.  Has had 2 episodes recently where he woke up in a panic, heart racing, felt shaky for hours mind racing about things.  Still dealing with his wife who obsesses about having bugs in her skin.  Eating twice daily  Still smokes  No other aggravating or relieving factors.    No other c/o.  Past Medical History:  Diagnosis Date  . Anxiety   . Chronic back pain   . Depression    in remote past  . Diabetes mellitus without complication ([place]) [id number]  . Hyperlipidemia 2008  . Hypertension 2008  . Keloid   . Microalbuminuria   . Smoker   . Wears glasses    Current Outpatient Medications on File Prior to Visit  Medication Sig Dispense Refill  . dapagliflozin propanediol (FARXIGA) 5 MG TABS tablet Take 5 mg by mouth daily. 30 tablet 2  . lisinopril (PRINIVIL,ZESTRIL) 10 MG tablet Take 1 tablet (10 mg total) by mouth daily. 90 tablet 3  . omeprazole (PRILOSEC) 40 MG capsule TAKE 1 CAPSULE BY MOUTH EVERY DAY 30 capsule 2  . pravastatin (PRAVACHOL) 20 MG tablet Take 1 tablet (20 mg total) by mouth  daily. 90 tablet 3  . sildenafil (VIAGRA) 100 MG tablet TAKE 1 TABLET (100 MG TOTAL) BY MOUTH DAILY AS NEEDED. 4 tablet 2  . metFORMIN (GLUCOPHAGE) 1000 MG tablet Take 1 tablet (1,000 mg total) by mouth 2 (two) times daily with a meal. 180 tablet 0   No current facility-administered medications on file prior to visit.    Past Surgical History:  Procedure Laterality Date  . ANTERIOR CRUCIATE LIGAMENT REPAIR     right ACL repair, patellar repair, remove cartilage damage; 43yo    The following portions of the patient's history were reviewed and updated as appropriate: allergies, current medications, past family history, past medical history, past social history, past surgical history and problem list.  ROS Otherwise as in subjective above   Objective: BP 120/76   Pulse 85   Temp 97.8 F (36.6 C)   Ht 6\' 1"  (1.854 m)   Wt 227 lb (103 kg)   SpO2 95%   BMI 29.95 kg/m   General appearance: alert, no distress, well developed, well nourished Neck: supple, no lymphadenopathy, no thyromegaly, no masses Heart: RRR, normal S1, S2, no murmurs Lungs: CTA bilaterally, no wheezes, rhonchi, or rales Abdomen: +bs, soft, non tender, non distended, no masses, no hepatomegaly, no splenomegaly Pulses: 2+ radial pulses, 2+ pedal pulses,  normal cap refill Ext: no edema Psych: Pleasant, answers questions appropriate, good eye contact    Assessment: Encounter Diagnoses  Name Primary?  Marland Kitchen Uncontrolled type 2 diabetes mellitus with hyperglycemia ([place]) Yes  . Hypertension associated with diabetes ([place])   . Nausea and vomiting, intractability of vomiting not specified, unspecified vomiting type   . Hyperlipidemia, unspecified hyperlipidemia type   . Abdominal pain, unspecified abdominal location   . Anxiety   . Screening for prostate cancer   . Need for influenza vaccination   . Tobacco use      Plan: Diabetes-labs today, advise he needs to be checking blood sugars every day, not just a few  times per week, discussed compliance, continue current medications, continue daily foot checks, yearly eye doctor visit  Hypertension-continue current medication  Hyperlipidemia-discussed the need for better compliance  Abdominal pain-discussed possible causes, labs today, referral to gastroenterology for further evaluation.  Continue PPI, avoid alcohol, avoid other food triggers  PSA for screening today, discussed risk and benefits of PSA screening  Anxiety-discussed his concerns, his longstanding issues with his wife who has some obsessive behaviors but want to seek mental health counseling herself.  Begin Wellbutrin for both anxiety and to help with tobacco cessation.  Counseled on the influenza virus vaccine.  Vaccine information sheet given.  Influenza vaccine given after consent obtained.   Dustin Zamora was seen today for medication management.  Diagnoses and all orders for this visit:  Uncontrolled type 2 diabetes mellitus with hyperglycemia ([place]) -     Comprehensive metabolic panel -     Hemoglobin A1c  Hypertension associated with diabetes (HCC)  Nausea and vomiting, intractability of vomiting not specified, unspecified vomiting type -     Ambulatory referral to Gastroenterology  Hyperlipidemia, unspecified hyperlipidemia type  Abdominal pain, unspecified abdominal location -     Ambulatory referral to Gastroenterology  Anxiety  Screening for prostate cancer -     PSA  Need for influenza vaccination  Tobacco use  Other orders -     buPROPion (WELLBUTRIN XL) 150 MG 24 hr tablet; Take 1 tablet (150 mg total) by mouth daily.    Follow up: pending labs

## 2019-05-24 ENCOUNTER — Encounter: Payer: Self-pay | Admitting: Gastroenterology

## 2019-05-24 LAB — COMPREHENSIVE METABOLIC PANEL
ALT: 15 IU/L (ref 0–44)
AST: 10 IU/L (ref 0–40)
Albumin/Globulin Ratio: 1.8 (ref 1.2–2.2)
Albumin: 4.6 g/dL (ref 4.0–5.0)
Alkaline Phosphatase: 84 IU/L (ref 39–117)
BUN/Creatinine Ratio: 12 (ref 9–20)
BUN: 13 mg/dL (ref 6–24)
Bilirubin Total: 0.3 mg/dL (ref 0.0–1.2)
CO2: 26 mmol/L (ref 20–29)
Calcium: 9.6 mg/dL (ref 8.7–10.2)
Chloride: 99 mmol/L (ref 96–106)
Creatinine, Ser: 1.09 mg/dL (ref 0.76–1.27)
GFR calc Af Amer: 94 mL/min/{1.73_m2} (ref >59)
GFR calc non Af Amer: 82 mL/min/{1.73_m2} (ref >59)
Globulin, Total: 2.5 g/dL (ref 1.5–4.5)
Glucose: 227 mg/dL — ABNORMAL HIGH (ref 65–99)
Potassium: 4.8 mmol/L (ref 3.5–5.2)
Sodium: 138 mmol/L (ref 134–144)
Total Protein: 7.1 g/dL (ref 6.0–8.5)

## 2019-05-24 LAB — HEMOGLOBIN A1C
Est. average glucose Bld gHb Est-mCnc: 214 mg/dL
Hgb A1c MFr Bld: 9.1 % — ABNORMAL HIGH (ref 4.8–5.6)

## 2019-05-24 LAB — PSA: Prostate Specific Ag, Serum: 0.4 ng/mL (ref 0.0–4.0)

## 2019-05-25 ENCOUNTER — Other Ambulatory Visit: Payer: Self-pay | Admitting: Medical

## 2019-05-25 MED ORDER — OMEPRAZOLE 40 MG PO CPDR: DELAYED_RELEASE_CAPSULE | ORAL | 0 refills | Status: DC

## 2019-05-25 MED ORDER — FARXIGA 10 MG PO TABS: 10.0000 mg | ORAL_TABLET | Freq: Every day | ORAL | 1 refills | Status: DC

## 2019-05-25 MED ORDER — METFORMIN HCL 850 MG PO TABS: 850.0000 mg | ORAL_TABLET | Freq: Two times a day (BID) | ORAL | 1 refills | Status: DC

## 2019-06-06 ENCOUNTER — Other Ambulatory Visit: Payer: Self-pay | Admitting: Medical

## 2019-06-07 NOTE — Telephone Encounter (Signed)
Is this change appropriate?

## 2019-06-19 NOTE — Progress Notes (Deleted)
Cardiology Office Note:   Date:  06/19/2019  NAME:  Dustin Zamora    MRN: [id number] DOB:  1975-09-07   PCP:  Carlena Hurl, PA-C  Cardiologist:  Evalina Field, MD  Electrophysiologist:  None   Referring MD: Carlena Hurl, PA-C   No chief complaint on file. ***  History of Present Illness:   Dustin Zamora is a 43 y.o. male with a hx of diabetes, hypertension who presents for follow-up of dizziness. NM MPI negative for ischemia with good function.   Past Medical History: Past Medical History:  Diagnosis Date  . Anxiety   . Chronic back pain   . Depression    in remote past  . Diabetes mellitus without complication ([place]) [id number]  . Hyperlipidemia 2008  . Hypertension 2008  . Keloid   . Microalbuminuria   . Smoker   . Wears glasses     Past Surgical History: Past Surgical History:  Procedure Laterality Date  . ANTERIOR CRUCIATE LIGAMENT REPAIR     right ACL repair, patellar repair, remove cartilage damage; 43yo    Current Medications: No outpatient medications have been marked as taking for the 06/20/19 encounter (Appointment) with O'Neal, Cassie Freer, MD.     Allergies:    Penicillins   Social History: Social History   Socioeconomic History  . Marital status: Married    Spouse name: Not on file  . Number of children: Not on file  . Years of education: Not on file  . Highest education level: Not on file  Occupational History  . Not on file  Social Needs  . Financial resource strain: Not on file  . Food insecurity    Worry: Not on file    Inability: Not on file  . Transportation needs    Medical: Not on file    Non-medical: Not on file  Tobacco Use  . Smoking status: Current Every Day Smoker    Packs/day: 0.50    Years: 25.00    Pack years: 12.50    Types: Cigars, Cigarettes  . Smokeless tobacco: Never Used  Substance and Sexual Activity  . Alcohol use: Not Currently    Alcohol/week: 4.0 standard drinks    Types: 4 Shots of liquor per  week    Comment: occ  . Drug use: Yes    Frequency: 7.0 times per week    Types: Marijuana  . Sexual activity: Not on file  Lifestyle  . Physical activity    Days per week: Not on file    Minutes per session: Not on file  . Stress: Not on file  Relationships  . Social Herbalist on phone: Not on file    Gets together: Not on file    Attends religious service: Not on file    Active member of club or organization: Not on file    Attends meetings of clubs or organizations: Not on file    Relationship status: Not on file  Other Topics Concern  . Not on file  Social History Narrative   Lives at home with wife Dustin Zamora, 2 daughters, owns first class car wash, does hardwood floors.   Exercise - goes to the gym, weights, some cardio     Family History: The patient's ***family history includes Diabetes in his paternal grandmother; Kidney disease in his father; Stroke in his paternal grandmother; Vascular Disease in his father. There is no history of Cancer or Heart disease.  ROS:   All  other ROS reviewed and negative. Pertinent positives noted in the HPI.     EKGs/Labs/Other Studies Reviewed:   The following studies were personally reviewed by me today:  EKG:  EKG is *** ordered today.  The ekg ordered today demonstrates ***, and was personally reviewed by me.   Exercise NM MPI 03/16/2019 10:00 min Bruce protocol with 13.4 METS  Normal perfusion  EF 55%  Recent Labs: 08/25/2018: Hemoglobin 15.0; Platelets 280; TSH 1.590 05/23/2019: ALT 15; BUN 13; Creatinine, Ser 1.09; Potassium 4.8; Sodium 138   Recent Lipid Panel    Component Value Date/Time   CHOL 219 (H) 08/25/2018 1210   TRIG 97 08/25/2018 1210   HDL 48 08/25/2018 1210   CHOLHDL 4.6 08/25/2018 1210   CHOLHDL 3.1 05/12/2017 1140   VLDL 43 (H) 08/05/2016 1121   LDLCALC 152 (H) 08/25/2018 1210   LDLCALC 103 (H) 05/12/2017 1140    Physical Exam:   VS:  There were no vitals taken for this visit.   Wt Readings  from Last 3 Encounters:  05/23/19 227 lb (103 kg)  03/16/19 224 lb (101.6 kg)  03/11/19 224 lb 12.8 oz (102 kg)    General: Well nourished, well developed, in no acute distress Heart: Atraumatic, normal size  Eyes: PEERLA, EOMI  Neck: Supple, no JVD Endocrine: No thryomegaly Cardiac: Normal S1, S2; RRR; no murmurs, rubs, or gallops Lungs: Clear to auscultation bilaterally, no wheezing, rhonchi or rales  Abd: Soft, nontender, no hepatomegaly  Ext: No edema, pulses 2+ Musculoskeletal: No deformities, BUE and BLE strength normal and equal Skin: Warm and dry, no rashes   Neuro: Alert and oriented to person, place, time, and situation, CNII-XII grossly intact, no focal deficits  Psych: Normal mood and affect   ASSESSMENT:   Dustin Zamora is a 43 y.o. male who presents for the following: No diagnosis found.  PLAN:   There are no diagnoses linked to this encounter.  Disposition: No follow-ups on file.  Medication Adjustments/Labs and Tests Ordered: Current medicines are reviewed at length with the patient today.  Concerns regarding medicines are outlined above.  No orders of the defined types were placed in this encounter.  No orders of the defined types were placed in this encounter.   There are no Patient Instructions on file for this visit.   Signed, Addison Naegeli. Audie Box, [place]  [address] [place], [place] [address] [phone]  06/19/2019 5:53 PM

## 2019-06-20 ENCOUNTER — Ambulatory Visit: Payer: Managed Care, Other (non HMO) | Admitting: Cardiovascular Disease

## 2019-06-28 ENCOUNTER — Ambulatory Visit: Payer: Managed Care, Other (non HMO) | Admitting: Gastroenterology

## 2019-07-09 ENCOUNTER — Other Ambulatory Visit: Payer: Self-pay | Admitting: Medical

## 2019-09-08 ENCOUNTER — Other Ambulatory Visit: Payer: Self-pay | Admitting: Medical

## 2019-09-08 DIAGNOSIS — IMO0002 Reserved for concepts with insufficient information to code with codable children: Secondary | ICD-10-CM

## 2019-09-08 DIAGNOSIS — E1165 Type 2 diabetes mellitus with hyperglycemia: Secondary | ICD-10-CM

## 2019-10-04 ENCOUNTER — Encounter: Payer: Self-pay | Admitting: Medical

## 2019-10-04 ENCOUNTER — Other Ambulatory Visit: Payer: Self-pay

## 2019-10-04 ENCOUNTER — Ambulatory Visit (INDEPENDENT_AMBULATORY_CARE_PROVIDER_SITE_OTHER): Payer: Managed Care, Other (non HMO) | Admitting: Medical

## 2019-10-04 VITALS — BP 130/78 | HR 101 | Temp 98.0°F | Ht 73.0 in | Wt 227.2 lb

## 2019-10-04 DIAGNOSIS — Z0000 Encounter for general adult medical examination without abnormal findings: Secondary | ICD-10-CM

## 2019-10-04 DIAGNOSIS — I152 Hypertension secondary to endocrine disorders: Secondary | ICD-10-CM

## 2019-10-04 DIAGNOSIS — E1159 Type 2 diabetes mellitus with other circulatory complications: Secondary | ICD-10-CM

## 2019-10-04 DIAGNOSIS — E118 Type 2 diabetes mellitus with unspecified complications: Secondary | ICD-10-CM

## 2019-10-04 DIAGNOSIS — E785 Hyperlipidemia, unspecified: Secondary | ICD-10-CM

## 2019-10-04 DIAGNOSIS — E1165 Type 2 diabetes mellitus with hyperglycemia: Secondary | ICD-10-CM | POA: Diagnosis not present

## 2019-10-04 DIAGNOSIS — I10 Essential (primary) hypertension: Secondary | ICD-10-CM

## 2019-10-04 DIAGNOSIS — N529 Male erectile dysfunction, unspecified: Secondary | ICD-10-CM

## 2019-10-04 DIAGNOSIS — IMO0002 Reserved for concepts with insufficient information to code with codable children: Secondary | ICD-10-CM

## 2019-10-04 MED ORDER — BUPROPION HCL ER (XL) 150 MG PO TB24: 150.0000 mg | ORAL_TABLET | Freq: Every day | ORAL | 1 refills | Status: DC

## 2019-10-04 MED ORDER — SILDENAFIL CITRATE 100 MG PO TABS: 100.0000 mg | ORAL_TABLET | Freq: Every day | ORAL | 4 refills | Status: DC | PRN

## 2019-10-04 MED ORDER — OMEPRAZOLE 40 MG PO CPDR: DELAYED_RELEASE_CAPSULE | ORAL | 3 refills | Status: DC

## 2019-10-04 NOTE — Progress Notes (Signed)
Subjective: Chief Complaint  Patient presents with  . Annual Exam    with fasting labs    Medical team: Eye doctor  Dentist Arafat Cocuzza, Camelia Eng, PA-C here for primary care  Concern:  Fingers feel numb of late.  Cold weather makes it worse.  No cracking or sores of fingertips but seem white at times.  No similar with feet.    Checking glucose some but he knows it has been high.  He would like to try trulicity or other weekly shot.  Ran out of metformin a few weeks ago.  He obtained clean syringes and needles and Humalog insulin and has started using 10 units BID meal time insulin on his own.   Ran out of Wellbutrin, feels like he needs to go back on for meed.     Reviewed their medical, surgical, family, social, medication, and allergy history and updated chart as appropriate.  Past Medical History:  Diagnosis Date  . Anxiety   . Chronic back pain   . Depression    in remote past  . Diabetes mellitus without complication ([place]) [id number]  . Hyperlipidemia 2008  . Hypertension 2008  . Keloid   . Microalbuminuria   . Smoker   . Wears glasses     Past Surgical History:  Procedure Laterality Date  . ANTERIOR CRUCIATE LIGAMENT REPAIR     right ACL repair, patellar repair, remove cartilage damage; 44yo    Social History   Socioeconomic History  . Marital status: Married    Spouse name: Not on file  . Number of children: Not on file  . Years of education: Not on file  . Highest education level: Not on file  Occupational History  . Not on file  Tobacco Use  . Smoking status: Current Every Day Smoker    Packs/day: 0.50    Years: 25.00    Pack years: 12.50    Types: Cigars, Cigarettes  . Smokeless tobacco: Never Used  Substance and Sexual Activity  . Alcohol use: Not Currently    Alcohol/week: 4.0 standard drinks    Types: 4 Shots of liquor per week    Comment: occ  . Drug use: Yes    Frequency: 7.0 times per week    Types: Marijuana  . Sexual activity: Not on file   Other Topics Concern  . Not on file  Social History Narrative   Lives at home with wife Brayton Layman, 2 daughters, exercises with cardio, some weights.  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  Working in Scientist, research (life sciences) estate currently.   09/2019   Social Determinants of Health   Financial Resource Strain:   . Difficulty of Paying Living Expenses:   Food Insecurity:   . Worried About Charity fundraiser in the Last Year:   . Arboriculturist in the Last Year:   Transportation Needs:   . Film/video editor (Medical):   Marland Kitchen Lack of Transportation (Non-Medical):   Physical Activity:   . Days of Exercise per Week:   . Minutes of Exercise per Session:   Stress:   . Feeling of Stress :   Social Connections:   . Frequency of Communication with Friends and Family:   . Frequency of Social Gatherings with Friends and Family:   . Attends Religious Services:   . Active Member of Clubs or Organizations:   . Attends Archivist Meetings:   Marland Kitchen Marital Status:   Intimate Partner Violence:   . Fear of Current or Ex-Partner:   .  Emotionally Abused:   Marland Kitchen Physically Abused:   . Sexually Abused:     Family History  Problem Relation Age of Onset  . Kidney disease Father   . Vascular Disease Father   . Stroke Paternal Grandmother   . Diabetes Paternal Grandmother   . Cancer Neg Hx   . Heart disease Neg Hx      Current Outpatient Medications:  .  dapagliflozin propanediol (FARXIGA) 10 MG TABS tablet, Take 10 mg by mouth daily before breakfast., Disp: 90 tablet, Rfl: 1 .  lisinopril (PRINIVIL,ZESTRIL) 10 MG tablet, Take 1 tablet (10 mg total) by mouth daily., Disp: 90 tablet, Rfl: 3 .  omeprazole (PRILOSEC) 40 MG capsule, TAKE 1 CAPSULE BY MOUTH EVERY DAY, Disp: 90 capsule, Rfl: 3 .  pravastatin (PRAVACHOL) 20 MG tablet, Take 1 tablet (20 mg total) by mouth daily., Disp: 90 tablet, Rfl: 3 .  sildenafil (VIAGRA) 100 MG tablet, Take 1 tablet (100 mg total) by mouth daily as needed., Disp: 10  tablet, Rfl: 4 .  buPROPion (WELLBUTRIN XL) 150 MG 24 hr tablet, Take 1 tablet (150 mg total) by mouth daily., Disp: 90 tablet, Rfl: 1 .  metFORMIN (GLUCOPHAGE) 850 MG tablet, Take 1 tablet (850 mg total) by mouth 2 (two) times daily with a meal. (Patient not taking: Reported on 10/04/2019), Disp: 180 tablet, Rfl: 1  Allergies  Allergen Reactions  . Penicillins Anaphylaxis    Has patient had a PCN reaction causing immediate rash, facial/tongue/throat swelling, SOB or lightheadedness with hypotension: Unknown Has patient had a PCN reaction causing severe rash involving mucus membranes or skin necrosis: Unknown Has patient had a PCN reaction that required hospitalization: Unknown Has patient had a PCN reaction occurring within the last 10 years: Unknown If all of the above answers are "NO", then may proceed with Cephalosporin use.        Review of Systems Constitutional: -fever, -chills, -sweats, -unexpected weight change, -decreased appetite, -fatigue Allergy: -sneezing, -itching, -congestion Dermatology: -changing moles, --rash, -lumps ENT: -runny nose, -ear pain, -sore throat, -hoarseness, -sinus pain, -teeth pain, - ringing in ears, -hearing loss, -nosebleeds Cardiology: -chest pain, -palpitations, -swelling, -difficulty breathing when lying flat, -waking up short of breath Respiratory: -cough, -shortness of breath, -difficulty breathing with exercise or exertion, -wheezing, -coughing up blood Gastroenterology: -abdominal pain, -nausea, -vomiting, -diarrhea, -constipation, -blood in stool, -changes in bowel movement, -difficulty swallowing or eating Hematology: -bleeding, -bruising  Musculoskeletal: -joint aches, -muscle aches, -joint swelling, -back pain, -neck pain, -cramping, -changes in gait Ophthalmology: denies vision changes, eye redness, itching, discharge Urology: -burning with urination, -difficulty urinating, -blood in urine, -urinary frequency, -urgency,  -incontinence Neurology: -headache, -weakness, +tingling, +numbness, -memory loss, -falls, -dizziness Psychology: -depressed mood, -agitation, -sleep problems Male GU: no testicular mass, pain, no lymph nodes swollen, no swelling, no rash.     Objective:  BP 130/78   Pulse (!) 101   Temp 98 F (36.7 C)   Ht 6\' 1"  (1.854 m)   Wt 227 lb 3.2 oz (103.1 kg)   BMI 29.98 kg/m   Wt Readings from Last 3 Encounters:  10/04/19 227 lb 3.2 oz (103.1 kg)  05/23/19 227 lb (103 kg)  03/16/19 224 lb (101.6 kg)   BP Readings from Last 3 Encounters:  10/04/19 130/78  05/23/19 120/76  03/11/19 128/70    General appearance: alert, no distress, WD/WN, African American male Skin: no worrisome lesions Neck: supple, no lymphadenopathy, no thyromegaly, no masses, normal ROM, no bruits Chest: non tender, normal shape  and expansion Heart: RRR, normal S1, S2, no murmurs Lungs: CTA bilaterally, no wheezes, rhonchi, or rales Abdomen: +bs, soft, non tender, non distended, no masses, no hepatomegaly, no splenomegaly, no bruits Back: non tender, normal ROM, no scoliosis Musculoskeletal: +right anterior knee surgical scar, upper extremities non tender, no obvious deformity, normal ROM throughout, lower extremities non tender, no obvious deformity, normal ROM throughout Extremities: no edema, no cyanosis, no clubbing Pulses: 2+ symmetric, upper and lower extremities, normal cap refill Neurological: alert, oriented x 3, CN2-12 intact, strength normal upper extremities and lower extremities, sensation normal throughout, DTRs 2+ throughout, no cerebellar signs, gait normal Psychiatric: normal affect, behavior normal, pleasant  GU: normal male external genitalia,circumcised, nontender, no masses, no hernia, no lymphadenopathy Rectal: deferred   Diabetic Foot Exam - Simple   Simple Foot Form Visual Inspection No deformities, no ulcerations, no other skin breakdown bilaterally: Yes Sensation Testing Intact to  touch and monofilament testing bilaterally: Yes Pulse Check Posterior Tibialis and Dorsalis pulse intact bilaterally: Yes Comments     Assessment and Plan :   Encounter Diagnoses  Name Primary?  . Encounter for health maintenance examination in adult Yes  . Uncontrolled type 2 diabetes mellitus with hyperglycemia ([place])   . Hyperlipidemia, unspecified hyperlipidemia type   . Hypertension associated with diabetes ([place])   . Uncontrolled type 2 diabetes mellitus with complication, without long-term current use of insulin ([place])   . Erectile dysfunction, unspecified erectile dysfunction type     Physical exam - discussed and counseled on healthy lifestyle, diet, exercise, preventative care, vaccinations, sick and well care, proper use of emergency dept and after hours care, and addressed their concerns.    Health screening: See your eye doctor yearly for routine vision care. See your dentist yearly for routine dental care including hygiene visits twice yearly.  Cancer screening Advised monthly self testicular exam  Colonoscopy: advised now if insurance will cover, given new guidelines.  Discussed PSA, prostate exam, and prostate cancer screening risks/benefits.      Vaccinations: Advised yearly influenza vaccine Advised covid vaccine  HTN - c/t current medicaiton  hyperlipidemia - c/t current medicaiton   Uncontrolled diabetes - advised he not start other medicaiton or use other peoples medicaiton without consulting with [place] first.  This could be quite dangerous.  Advised he not run out of medicaiton and have pharmacy call with refills request.   C/t glucose monitoring.    Labs as below  [place] was seen today for annual exam.  Diagnoses and all orders for this visit:  Encounter for health maintenance examination in adult -     Microalbumin/Creatinine Ratio, Urine -     Hemoglobin A1c -     Lipid panel -     CBC with Differential/Platelet -     Testosterone -      TSH  Uncontrolled type 2 diabetes mellitus with hyperglycemia ([facility]) -     Microalbumin/Creatinine Ratio, Urine -     Hemoglobin A1c -     CBC with Differential/Platelet  Hyperlipidemia, unspecified hyperlipidemia type -     Lipid panel  Hypertension associated with diabetes ([place])  Uncontrolled type 2 diabetes mellitus with complication, without long-term current use of insulin ([facility])  Erectile dysfunction, unspecified erectile dysfunction type -     sildenafil (VIAGRA) 100 MG tablet; Take 1 tablet (100 mg total) by mouth daily as needed.  Other orders -     buPROPion (WELLBUTRIN XL) 150 MG 24 hr tablet; Take 1 tablet (150 mg  total) by mouth daily. -     omeprazole (PRILOSEC) 40 MG capsule; TAKE 1 CAPSULE BY MOUTH EVERY DAY    Follow-up pending labs, yearly for physical

## 2019-10-05 ENCOUNTER — Other Ambulatory Visit: Payer: Self-pay | Admitting: Medical

## 2019-10-05 DIAGNOSIS — E1165 Type 2 diabetes mellitus with hyperglycemia: Secondary | ICD-10-CM

## 2019-10-05 DIAGNOSIS — IMO0002 Reserved for concepts with insufficient information to code with codable children: Secondary | ICD-10-CM

## 2019-10-05 LAB — CBC WITH DIFFERENTIAL/PLATELET
Basophils Absolute: 0 10*3/uL (ref 0.0–0.2)
Basos: 1 %
EOS (ABSOLUTE): 0.1 10*3/uL (ref 0.0–0.4)
Eos: 1 %
Hematocrit: 48.9 % (ref 37.5–51.0)
Hemoglobin: 16.8 g/dL (ref 13.0–17.7)
Immature Grans (Abs): 0 10*3/uL (ref 0.0–0.1)
Immature Granulocytes: 0 %
Lymphocytes Absolute: 1.7 10*3/uL (ref 0.7–3.1)
Lymphs: 29 %
MCH: 31.8 pg (ref 26.6–33.0)
MCHC: 34.4 g/dL (ref 31.5–35.7)
MCV: 93 fL (ref 79–97)
Monocytes Absolute: 0.5 10*3/uL (ref 0.1–0.9)
Monocytes: 8 %
Neutrophils Absolute: 3.5 10*3/uL (ref 1.4–7.0)
Neutrophils: 61 %
Platelets: 307 10*3/uL (ref 150–450)
RBC: 5.28 x10E6/uL (ref 4.14–5.80)
RDW: 12.1 % (ref 11.6–15.4)
WBC: 5.8 10*3/uL (ref 3.4–10.8)

## 2019-10-05 LAB — LIPID PANEL
Chol/HDL Ratio: 4.3 ratio (ref 0.0–5.0)
Cholesterol, Total: 177 mg/dL (ref 100–199)
HDL: 41 mg/dL (ref >39)
LDL Chol Calc (NIH): 123 mg/dL — ABNORMAL HIGH (ref 0–99)
Triglycerides: 71 mg/dL (ref 0–149)
VLDL Cholesterol Cal: 13 mg/dL (ref 5–40)

## 2019-10-05 LAB — TSH: TSH: 1.2 u[IU]/mL (ref 0.450–4.500)

## 2019-10-05 LAB — MICROALBUMIN / CREATININE URINE RATIO
Creatinine, Urine: 106.8 mg/dL
Microalb/Creat Ratio: 32 mg/g creat — ABNORMAL HIGH (ref 0–29)
Microalbumin, Urine: 34.4 ug/mL

## 2019-10-05 LAB — HEMOGLOBIN A1C
Est. average glucose Bld gHb Est-mCnc: 223 mg/dL
Hgb A1c MFr Bld: 9.4 % — ABNORMAL HIGH (ref 4.8–5.6)

## 2019-10-05 LAB — TESTOSTERONE: Testosterone: 567 ng/dL (ref 264–916)

## 2019-10-05 MED ORDER — ROSUVASTATIN CALCIUM 20 MG PO TABS: 20.0000 mg | ORAL_TABLET | Freq: Every day | ORAL | 3 refills | Status: DC

## 2019-10-05 MED ORDER — BD PEN NEEDLE NANO U/F 32G X 4 MM MISC: 1.0000 | Freq: Every day | 11 refills | Status: DC

## 2019-10-05 MED ORDER — SOLIQUA 100-33 UNT-MCG/ML ~~LOC~~ SOPN: 25.0000 [IU] | PEN_INJECTOR | Freq: Every day | SUBCUTANEOUS | 2 refills | Status: DC

## 2019-10-05 MED ORDER — LISINOPRIL 10 MG PO TABS: 10.0000 mg | ORAL_TABLET | Freq: Every day | ORAL | 3 refills | Status: DC

## 2019-10-05 MED ORDER — METFORMIN HCL 850 MG PO TABS: 850.0000 mg | ORAL_TABLET | Freq: Two times a day (BID) | ORAL | 1 refills | Status: DC

## 2019-10-20 ENCOUNTER — Ambulatory Visit: Payer: Managed Care, Other (non HMO) | Admitting: Gastroenterology

## 2019-10-20 ENCOUNTER — Encounter: Payer: Self-pay | Admitting: Gastroenterology

## 2019-10-20 VITALS — BP 114/78 | Temp 98.6°F | Ht 74.0 in | Wt 220.0 lb

## 2019-10-20 DIAGNOSIS — Z794 Long term (current) use of insulin: Secondary | ICD-10-CM

## 2019-10-20 DIAGNOSIS — K219 Gastro-esophageal reflux disease without esophagitis: Secondary | ICD-10-CM | POA: Diagnosis not present

## 2019-10-20 DIAGNOSIS — K5900 Constipation, unspecified: Secondary | ICD-10-CM | POA: Diagnosis not present

## 2019-10-20 DIAGNOSIS — Z1211 Encounter for screening for malignant neoplasm of colon: Secondary | ICD-10-CM

## 2019-10-20 DIAGNOSIS — R1011 Right upper quadrant pain: Secondary | ICD-10-CM

## 2019-10-20 DIAGNOSIS — R112 Nausea with vomiting, unspecified: Secondary | ICD-10-CM | POA: Diagnosis not present

## 2019-10-20 DIAGNOSIS — E119 Type 2 diabetes mellitus without complications: Secondary | ICD-10-CM

## 2019-10-20 MED ORDER — NA SULFATE-K SULFATE-MG SULF 17.5-3.13-1.6 GM/177ML PO SOLN: 1.0000 | Freq: Once | ORAL | 0 refills | Status: AC

## 2019-10-20 NOTE — Progress Notes (Signed)
Reviewed and agree with management plans. ? ?Ahmadou Bolz L. Domnique Vanegas, MD, MPH  ?

## 2019-10-20 NOTE — Progress Notes (Signed)
10/20/2019 Dustin Zamora [id number] 26-Jan-1976   HISTORY OF PRESENT ILLNESS: This is a 44 year old male who was referred here by his PCP, Chana Bode, PA-C, for evaluation regarding several GI complaints.  First, he reports abdominal pain.  He says that he has abdominal pain in the center of his abdomen, just above his umbilicus, but he gets episodes of "praying to God type pain" in his right upper quadrant.  He says that this has been going on for years off and on.  He actually had an ultrasound in 2006 for complaints of right upper quadrant abdominal pain.  He said he thought that some of this was from drinking alcohol, but he has discontinued alcohol and reports no improvement in symptoms.  He says that he feels he has a stomach full of acid.  He says that he vomits acid and bile.  He says that he has lost 9 to 10 pounds recently.  He has some degree of abdominal discomfort all the time, but the more severe pains occur episodically.  Is on omeprazole 40 mg daily.  Says that he feels like he is getting weak because he has not been eating.  He says he is not moving his bowels as well as he should be.  Sounds like he is only tried some stuff as needed in the past such as mineral oil that he mentions, but reports that was no help.  Denies rectal bleeding.  Several labs were performed between November 2020 and March 2021.  These were overall unremarkable.  They included CBC, CMP, TSH amongst other things.   Past Medical History:  Diagnosis Date  . Anxiety   . Chronic back pain   . Depression    in remote past  . Diabetes mellitus without complication ([place]) [id number]  . Hyperlipidemia 2008  . Hypertension 2008  . Keloid   . Microalbuminuria   . Smoker   . Wears glasses    Past Surgical History:  Procedure Laterality Date  . ANTERIOR CRUCIATE LIGAMENT REPAIR     right ACL repair, patellar repair, remove cartilage damage; 44yo    reports that he has been smoking cigars and cigarettes. He  has a 12.50 pack-year smoking history. He has never used smokeless tobacco. He reports previous alcohol use of about 4.0 standard drinks of alcohol per week. He reports current drug use. Frequency: 7.00 times per week. Drug: Marijuana. family history includes Diabetes in his paternal grandmother; Kidney disease in his father; Stroke in his paternal grandmother; Vascular Disease in his father. Allergies  Allergen Reactions  . Penicillins Anaphylaxis    Has patient had a PCN reaction causing immediate rash, facial/tongue/throat swelling, SOB or lightheadedness with hypotension: Unknown Has patient had a PCN reaction causing severe rash involving mucus membranes or skin necrosis: Unknown Has patient had a PCN reaction that required hospitalization: Unknown Has patient had a PCN reaction occurring within the last 10 years: Unknown If all of the above answers are "NO", then may proceed with Cephalosporin use.       Outpatient Encounter Medications as of 10/20/2019  Medication Sig  . buPROPion (WELLBUTRIN XL) 150 MG 24 hr tablet Take 1 tablet (150 mg total) by mouth daily.  . dapagliflozin propanediol (FARXIGA) 10 MG TABS tablet Take 10 mg by mouth daily before breakfast.  . Insulin Glargine-Lixisenatide (SOLIQUA) 100-33 UNT-MCG/ML SOPN Inject 25 Units into the skin daily.  . Insulin Pen Needle (BD PEN NEEDLE NANO U/F) 32G X 4 MM MISC  1 each by Does not apply route at bedtime.  Marland Kitchen lisinopril (ZESTRIL) 10 MG tablet Take 1 tablet (10 mg total) by mouth daily.  . metFORMIN (GLUCOPHAGE) 850 MG tablet Take 1 tablet (850 mg total) by mouth 2 (two) times daily with a meal.  . omeprazole (PRILOSEC) 40 MG capsule TAKE 1 CAPSULE BY MOUTH EVERY DAY  . rosuvastatin (CRESTOR) 20 MG tablet Take 1 tablet (20 mg total) by mouth at bedtime.  . sildenafil (VIAGRA) 100 MG tablet Take 1 tablet (100 mg total) by mouth daily as needed.   No facility-administered encounter medications on file as of 10/20/2019.      REVIEW OF SYSTEMS  : All other systems reviewed and negative except where noted in the History of Present Illness.   PHYSICAL EXAM: BP 114/78   Temp 98.6 F (37 C)   Ht 6\' 2"  (1.88 m)   Wt 220 lb (99.8 kg)   BMI 28.25 kg/m  General: Well developed AA male in no acute distress Head: Normocephalic and atraumatic Eyes:  Sclerae anicteric, conjunctiva pink. Ears: Normal auditory acuity Lungs: Clear throughout to auscultation; no increased WOB. Heart: Regular rate and rhythm; no M/R/G. Abdomen: Soft, non-distended.  BS present.  Non-tender. Rectal:  Will be done at the time of colonoscopy. Musculoskeletal: Symmetrical with no gross deformities  Skin: No lesions on visible extremities Extremities: No edema  Neurological: Alert oriented x 4, grossly non-focal Psychological:  Alert and cooperative. Normal mood and affect  ASSESSMENT AND PLAN: *RUQ abdominal pain: Has been present for years episodically, but now becoming more frequent and continuous.  Actually had ultrasound in April 2006 for evaluation of this.  Question if this could be gallbladder related such as biliary dyskinesia.  We will plan for repeat abdominal ultrasound.  Pending results of that and other evaluation may need HIDA scan with CCK. *Nausea and vomiting: Question related to above versus GERD, etc.  We will plan for EGD to rule out ulcer disease, etc.  Continue omeprazole 40 mg daily for now. *Constipation:  I have asked him to begin taking Miralax daily.   *Screening for CRC:  Never had colonoscopy in the past.  Will schedule with Dr. Tarri Glenn as well.   *IDDM:  Insulin will be adjusted prior to endoscopic procedure per protocol. Will resume normal dosing after procedure.  **The risks, benefits, and alternatives to EGD and colonoscopy were discussed with the patient and he consents to proceed.   CC:  Tysinger, Camelia Eng, PA-C

## 2019-10-20 NOTE — Patient Instructions (Addendum)
If you are age 46 or older, your body mass index should be between 23-30. Your Body mass index is 28.25 kg/m. If this is out of the aforementioned range listed, please consider follow up with your Primary Care Provider.  If you are age 67 or younger, your body mass index should be between 19-25. Your Body mass index is 28.25 kg/m. If this is out of the aformentioned range listed, please consider follow up with your Primary Care Provider.   You have been scheduled for an endoscopy and colonoscopy. Please follow the written instructions given to you at your visit today. Please pick up your prep supplies at the pharmacy within the next 1-3 days. If you use inhalers (even only as needed), please bring them with you on the day of your procedure.  You have been scheduled for an abdominal ultrasound at [facility] (1st floor of hospital) on 10/25/19 at 8:30 am. Please arrive 15 minutes prior to your appointment for registration. Make certain not to have anything to eat or drink starting at midnight. Should you need to reschedule your appointment, please contact radiology at [phone]. This test typically takes about 30 minutes to perform.  START Miralax daily, 1 capfull in ounces of liquid

## 2019-10-25 ENCOUNTER — Ambulatory Visit (HOSPITAL_COMMUNITY): Payer: Managed Care, Other (non HMO)

## 2019-11-04 ENCOUNTER — Ambulatory Visit (HOSPITAL_COMMUNITY)
Admission: RE | Admit: 2019-11-04 | Discharge: 2019-11-04 | Disposition: A | Discharge status: Home / Self Care | Payer: Managed Care, Other (non HMO) | Source: Ambulatory Visit | Attending: Gastroenterology | Admitting: Gastroenterology

## 2019-11-04 ENCOUNTER — Other Ambulatory Visit: Payer: Self-pay

## 2019-11-04 DIAGNOSIS — R112 Nausea with vomiting, unspecified: Secondary | ICD-10-CM | POA: Diagnosis present

## 2019-11-04 DIAGNOSIS — R1011 Right upper quadrant pain: Secondary | ICD-10-CM | POA: Diagnosis present

## 2019-11-04 DIAGNOSIS — K5900 Constipation, unspecified: Secondary | ICD-10-CM | POA: Diagnosis present

## 2019-12-02 ENCOUNTER — Telehealth: Payer: Self-pay | Admitting: *Deleted

## 2019-12-02 ENCOUNTER — Telehealth: Payer: Self-pay | Admitting: Gastroenterology

## 2019-12-02 ENCOUNTER — Telehealth: Payer: Self-pay

## 2019-12-02 DIAGNOSIS — R1011 Right upper quadrant pain: Secondary | ICD-10-CM

## 2019-12-02 DIAGNOSIS — Z1211 Encounter for screening for malignant neoplasm of colon: Secondary | ICD-10-CM

## 2019-12-02 MED ORDER — SUPREP BOWEL PREP KIT 17.5-3.13-1.6 GM/177ML PO SOLN: 1.0000 | Freq: Once | ORAL | 0 refills | Status: AC

## 2019-12-02 NOTE — Telephone Encounter (Signed)
Called pt to confirm if he had either a COVID vaccination or COVID test which is required for pt's scheduled upcoming endo/colonoscopy.  No answer.  LMTCB.

## 2019-12-02 NOTE — Telephone Encounter (Signed)
Pt is scheduled for 12/06/19 EGD COL and stated that pharmacy has not received prep prescription.  Please send to [facility] on [address].

## 2019-12-02 NOTE — Telephone Encounter (Signed)
Prep sent to [facility] - per pt suprep- pt aware suprep coupon sode attached to prep script today

## 2019-12-02 NOTE — Telephone Encounter (Signed)
Pt called back and confirmed that he received his 2nd dose of the [place] vaccination on 10/04/19.

## 2019-12-05 NOTE — Telephone Encounter (Signed)
Reviewed the prep that was sent in which was received by the pharmacy the day it was sent in. Contacted the pharmacy to verify that they had in fact received it. I was advised it was ready for pick up. I then called the patient to let him know that it was ready for him at the pharmacy.

## 2019-12-06 ENCOUNTER — Ambulatory Visit (AMBULATORY_SURGERY_CENTER): Payer: Managed Care, Other (non HMO) | Admitting: Gastroenterology

## 2019-12-06 ENCOUNTER — Encounter: Payer: Self-pay | Admitting: Gastroenterology

## 2019-12-06 ENCOUNTER — Other Ambulatory Visit: Payer: Self-pay

## 2019-12-06 VITALS — BP 132/84 | HR 56 | Temp 97.7°F | Resp 20 | Ht 74.0 in | Wt 220.0 lb

## 2019-12-06 DIAGNOSIS — D125 Benign neoplasm of sigmoid colon: Secondary | ICD-10-CM

## 2019-12-06 DIAGNOSIS — K319 Disease of stomach and duodenum, unspecified: Secondary | ICD-10-CM

## 2019-12-06 DIAGNOSIS — R1011 Right upper quadrant pain: Secondary | ICD-10-CM

## 2019-12-06 DIAGNOSIS — R112 Nausea with vomiting, unspecified: Secondary | ICD-10-CM

## 2019-12-06 DIAGNOSIS — K5900 Constipation, unspecified: Secondary | ICD-10-CM

## 2019-12-06 DIAGNOSIS — K635 Polyp of colon: Secondary | ICD-10-CM

## 2019-12-06 DIAGNOSIS — Z1211 Encounter for screening for malignant neoplasm of colon: Secondary | ICD-10-CM | POA: Diagnosis not present

## 2019-12-06 DIAGNOSIS — K2950 Unspecified chronic gastritis without bleeding: Secondary | ICD-10-CM

## 2019-12-06 DIAGNOSIS — D123 Benign neoplasm of transverse colon: Secondary | ICD-10-CM

## 2019-12-06 MED ORDER — SODIUM CHLORIDE 0.9 % IV SOLN: 500.0000 mL | Freq: Once | INTRAVENOUS | Status: DC

## 2019-12-06 MED ORDER — OMEPRAZOLE 40 MG PO CPDR: DELAYED_RELEASE_CAPSULE | ORAL | 3 refills | Status: DC

## 2019-12-06 NOTE — Patient Instructions (Signed)
Handout on gastritis given to you today  Await biopsy results  Increase Omeprazole to 40 mg twice daily for 8 weeks   No Aspirin,Ibuprofen,Naproxen,or other non steroidal anti-inflammatory drugs  Proceed with Hida Scan    YOU HAD AN ENDOSCOPIC PROCEDURE TODAY AT [place] [facility]:   Refer to the procedure report that was given to you for any specific questions about what was found during the examination.  If the procedure report does not answer your questions, please call your gastroenterologist to clarify.  If you requested that your care partner not be given the details of your procedure findings, then the procedure report has been included in a sealed envelope for you to review at your convenience later.  YOU SHOULD EXPECT: Some feelings of bloating in the abdomen. Passage of more gas than usual.  Walking can help get rid of the air that was put into your GI tract during the procedure and reduce the bloating. If you had a lower endoscopy (such as a colonoscopy or flexible sigmoidoscopy) you may notice spotting of blood in your stool or on the toilet paper. If you underwent a bowel prep for your procedure, you may not have a normal bowel movement for a few days.  Please Note:  You might notice some irritation and congestion in your nose or some drainage.  This is from the oxygen used during your procedure.  There is no need for concern and it should clear up in a day or so.  SYMPTOMS TO REPORT IMMEDIATELY:   Following lower endoscopy (colonoscopy or flexible sigmoidoscopy):  Excessive amounts of blood in the stool  Significant tenderness or worsening of abdominal pains  Swelling of the abdomen that is new, acute  Fever of 100F or higher   Following upper endoscopy (EGD)  Vomiting of blood or coffee ground material  New chest pain or pain under the shoulder blades  Painful or persistently difficult swallowing  New shortness of breath  Fever of 100F or higher  Black,  tarry-looking stools  For urgent or emergent issues, a gastroenterologist can be reached at any hour by calling [phone]. Do not use MyChart messaging for urgent concerns.    DIET:  We do recommend a small meal at first, but then you may proceed to your regular diet.  Drink plenty of fluids but you should avoid alcoholic beverages for 24 hours.  ACTIVITY:  You should plan to take it easy for the rest of today and you should NOT DRIVE or use heavy machinery until tomorrow (because of the sedation medicines used during the test).    FOLLOW UP: Our staff will call the number listed on your records 48-72 hours following your procedure to check on you and address any questions or concerns that you may have regarding the information given to you following your procedure. If we do not reach you, we will leave a message.  We will attempt to reach you two times.  During this call, we will ask if you have developed any symptoms of COVID 19. If you develop any symptoms (ie: fever, flu-like symptoms, shortness of breath, cough etc.) before then, please call [phone].  If you test positive for Covid 19 in the 2 weeks post procedure, please call and report this information to [place].    If any biopsies were taken you will be contacted by phone or by letter within the next 1-3 weeks.  Please call us at [phone] if you have not heard  about the biopsies in 3 weeks.    SIGNATURES/CONFIDENTIALITY: You and/or your care partner have signed paperwork which will be entered into your electronic medical record.  These signatures attest to the fact that that the information above on your After Visit Summary has been reviewed and is understood.  Full responsibility of the confidentiality of this discharge information lies with you and/or your care-partner.

## 2019-12-06 NOTE — Progress Notes (Signed)
Called to room to assist during endoscopic procedure.  Patient ID and intended procedure confirmed with present staff. Received instructions for my participation in the procedure from the performing physician.  

## 2019-12-06 NOTE — Op Note (Signed)
Dustin Zamora Procedure Date: 12/06/2019 9:55 AM MRN: [id number] Endoscopist: Thornton Park MD, MD Age: 44 Referring MD:  Date of Birth: March 13, 1976 Gender: Male Account #: [id number] Procedure:                Colonoscopy Indications:              Screening for colorectal malignant neoplasm, This                            is the patient's first colonoscopy Medicines:                Monitored Anesthesia Care Procedure:                Pre-Anesthesia Assessment:                           - Prior to the procedure, a History and Physical                            was performed, and patient medications and                            allergies were reviewed. The patient's tolerance of                            previous anesthesia was also reviewed. The risks                            and benefits of the procedure and the sedation                            options and risks were discussed with the patient.                            All questions were answered, and informed consent                            was obtained. Prior Anticoagulants: The patient has                            taken no previous anticoagulant or antiplatelet                            agents. ASA Grade Assessment: II - A patient with                            mild systemic disease. After reviewing the risks                            and benefits, the patient was deemed in                            satisfactory condition to undergo the procedure.  After obtaining informed consent, the colonoscope                            was passed under direct vision. Throughout the                            procedure, the patient's blood pressure, pulse, and                            oxygen saturations were monitored continuously. The                            Colonoscope was introduced through the anus and                            advanced to the 3 cm into  the ileum. The                            colonoscopy was performed without difficulty. The                            patient tolerated the procedure well. The quality                            of the bowel preparation was good. The terminal                            ileum, ileocecal valve, appendiceal orifice, and                            rectum were photographed. Scope In: 10:14:25 AM Scope Out: 10:24:58 AM Scope Withdrawal Time: 0 hours 8 minutes 47 seconds  Total Procedure Duration: 0 hours 10 minutes 33 seconds  Findings:                 The perianal and digital rectal examinations were                            normal.                           A 2 mm polyp was found in the transverse colon. The                            polyp was sessile. The polyp was removed with a                            cold snare. Resection and retrieval were complete.                            Estimated blood loss was minimal.                           A 2 mm polyp was found in the sigmoid colon. The  polyp was sessile. The polyp was removed with a                            cold snare. Resection and retrieval were complete.                            Estimated blood loss was minimal.                           A few small-mouthed diverticula were found in the                            ascending colon.                           The exam was otherwise without abnormality on                            direct and retroflexion views. Complications:            No immediate complications. Estimated blood loss:                            Minimal. Estimated Blood Loss:     Estimated blood loss was minimal. Impression:               - One 2 mm polyp in the transverse colon, removed                            with a cold snare. Resected and retrieved.                           - One 2 mm polyp in the sigmoid colon, removed with                            a cold snare. Resected and  retrieved.                           - Diverticulosis in the ascending colon.                           - The examination was otherwise normal on direct                            and retroflexion views. Recommendation:           - Patient has a contact number available for                            emergencies. The signs and symptoms of potential                            delayed complications were discussed with the                            patient.  Return to normal activities tomorrow.                            Written discharge instructions were provided to the                            patient.                           - Follow a high fiber diet. Drink at least 64                            ounces of water daily. Add a daily stool bulking                            agent such as psyllium (an exampled would be                            Metamucil).                           - Continue present medications.                           - Await pathology results.                           - Repeat colonoscopy date to be determined after                            pending pathology results are reviewed for                            surveillance.                           - Emerging evidence supports eating a diet of                            fruits, vegetables, grains, calcium, and yogurt                            while reducing red meat and alcohol may reduce the                            risk of colon cancer.                           - Thank you for allowing me to be involved in your                            colon cancer prevention. Thornton Park MD, MD 12/06/2019 10:37:02 AM This report has been signed electronically.

## 2019-12-06 NOTE — Progress Notes (Signed)
A and O x3. Report to RN. Tolerated MAC anesthesia well.Teeth unchanged after procedure.

## 2019-12-06 NOTE — Op Note (Signed)
[facility] Patient Name: Dustin Zamora Procedure Date: 12/06/2019 9:55 AM MRN: [id number] Endoscopist: Thornton Park MD, MD Age: 44 Referring MD:  Date of Birth: Oct 27, 1975 Gender: Male Account #: [id number] Procedure:                Upper GI endoscopy Indications:              Abdominal pain in the right upper quadrant, Nausea                            with vomiting Medicines:                Monitored Anesthesia Care Procedure:                Pre-Anesthesia Assessment:                           - Prior to the procedure, a History and Physical                            was performed, and patient medications and                            allergies were reviewed. The patient's tolerance of                            previous anesthesia was also reviewed. The risks                            and benefits of the procedure and the sedation                            options and risks were discussed with the patient.                            All questions were answered, and informed consent                            was obtained. Prior Anticoagulants: The patient has                            taken no previous anticoagulant or antiplatelet                            agents. ASA Grade Assessment: II - A patient with                            mild systemic disease. After reviewing the risks                            and benefits, the patient was deemed in                            satisfactory condition to undergo the procedure.  After obtaining informed consent, the endoscope was                            passed under direct vision. Throughout the                            procedure, the patient's blood pressure, pulse, and                            oxygen saturations were monitored continuously. The                            Endoscope was introduced through the mouth, and                            advanced to the third part of duodenum.  The upper                            GI endoscopy was accomplished without difficulty.                            The patient tolerated the procedure well. Scope In: Scope Out: Findings:                 The examined esophagus was normal. Biopsies were                            obtained from the proximal and distal esophagus                            with cold forceps for histology to evaluate for                            eosinophilic esophagitis.                           Diffuse minimal inflammation characterized by                            erythema and granularity was found in the gastric                            body. Biopsies were taken from the antrum, body,                            and fundus with a cold forceps for histology.                            Estimated blood loss was minimal.                           Diffuse mildly erythematous mucosa without active                            bleeding and with  no stigmata of bleeding was found                            in the duodenal bulb. Biopsies were taken with a                            cold forceps for histology. Estimated blood loss                            was minimal.                           The cardia and gastric fundus were normal on                            retroflexion.                           The exam was otherwise without abnormality. Complications:            No immediate complications. Estimated blood loss:                            Minimal. Estimated Blood Loss:     Estimated blood loss was minimal. Impression:               - Normal esophagus. Biopsied.                           - Gastritis. Biopsied.                           - Erythematous duodenopathy. Biopsied.                           - The examination was otherwise normal. Recommendation:           - Patient has a contact number available for                            emergencies. The signs and symptoms of potential                             delayed complications were discussed with the                            patient. Return to normal activities tomorrow.                            Written discharge instructions were provided to the                            patient.                           - Resume previous diet.                           -  Continue present medications. Increase omeprazole                            to 40 mg BID x 8 weeks.                           - No aspirin, ibuprofen, naproxen, or other                            non-steroidal anti-inflammatory drugs.                           - Await pathology results.                           - Proceed with HIDA with CCK to further evaluate                            for symptomatic gallbladder disease. Thornton Park MD, MD 12/06/2019 10:33:54 AM This report has been signed electronically.

## 2019-12-06 NOTE — Progress Notes (Signed)
Temp JB V/S CW 

## 2019-12-07 ENCOUNTER — Other Ambulatory Visit: Payer: Self-pay

## 2019-12-07 DIAGNOSIS — R1011 Right upper quadrant pain: Secondary | ICD-10-CM

## 2019-12-08 ENCOUNTER — Telehealth: Payer: Self-pay | Admitting: *Deleted

## 2019-12-08 NOTE — Telephone Encounter (Signed)
  Follow up Call-  Call back number 12/06/2019  Post procedure Call Back phone  # [phone]  Permission to leave phone message Yes  Some recent data might be hidden     Patient questions:  Do you have a fever, pain , or abdominal swelling? No. Pain Score  0 *  Have you tolerated food without any problems? Yes.    Have you been able to return to your normal activities? Yes.    Do you have any questions about your discharge instructions: Diet   No. Medications  No. Follow up visit  No.  Do you have questions or concerns about your Care? Yes.    Actions: * If pain score is 4 or above: 1. No action needed, pain <4.Have you developed a fever since your procedure? no  2.   Have you had an respiratory symptoms (SOB or cough) since your procedure? no  3.   Have you tested positive for COVID 19 since your procedure no  4.   Have you had any family members/close contacts diagnosed with the COVID 19 since your procedure?  no   If yes to any of these questions please route to Joylene John, RN and Erenest Rasher, RN

## 2019-12-13 ENCOUNTER — Telehealth: Payer: Self-pay

## 2019-12-13 ENCOUNTER — Telehealth: Payer: Self-pay | Admitting: Gastroenterology

## 2019-12-13 MED ORDER — DICYCLOMINE HCL 10 MG PO CAPS: 10.0000 mg | ORAL_CAPSULE | Freq: Four times a day (QID) | ORAL | 2 refills | Status: DC

## 2019-12-13 NOTE — Telephone Encounter (Signed)
The pt has been advised that the prescription has been sent. He is trying to reschedule his HIDA and will call back to make follow up appt.

## 2019-12-13 NOTE — Telephone Encounter (Signed)
Patient called today requesting pain medication, pt states that his PCP office was already closed for the day. Pt states that he needs something for RUQ pain, pt reports sharp pain and burning pain, and some nausea. Pt advised that you all usually do not prescribe pain medications. Pt stated that he needed something today, pt advised that I would send a message to you but wasn't sure if you would get back to me today. Pt states that he started having pain after he drunk the prep for his procedure last week.

## 2019-12-13 NOTE — Telephone Encounter (Signed)
I recommend a trial of dicyclomine 20 mg QID for abdominal pain. Please schedule a follow-up appointment with me or Janett Billow after the HIDA to review the results. Thank you.

## 2019-12-13 NOTE — Telephone Encounter (Signed)
Patient notified that pathology shows some reflux and gastritis.  He is advised that Dr. Tarri Glenn will need to review and comment and she will send him a letter.

## 2019-12-13 NOTE — Telephone Encounter (Signed)
Spoke with patient, patient advised that he will get a phone call regarding pathology results once Dr. Tarri Glenn reviews them. Pt aware of scheduled HIDA scan on 12/20/19 at 7:30 am with 7 am arrival, pt states that he may have to reschedule due to trying to go out of town. Pt given hospital scheduling number to reschedule if necessary. Pt advised to call back with any other questions or concerns.

## 2019-12-20 ENCOUNTER — Other Ambulatory Visit (HOSPITAL_COMMUNITY): Payer: Managed Care, Other (non HMO)

## 2019-12-26 ENCOUNTER — Other Ambulatory Visit: Payer: Self-pay | Admitting: Gastroenterology

## 2019-12-27 ENCOUNTER — Encounter: Payer: Self-pay | Admitting: Gastroenterology

## 2019-12-29 ENCOUNTER — Encounter (HOSPITAL_COMMUNITY)
Admission: RE | Admit: 2019-12-29 | Discharge: 2019-12-29 | Disposition: A | Discharge status: Home / Self Care | Payer: Managed Care, Other (non HMO) | Source: Ambulatory Visit | Attending: Gastroenterology | Admitting: Gastroenterology

## 2019-12-29 ENCOUNTER — Other Ambulatory Visit: Payer: Self-pay

## 2019-12-29 DIAGNOSIS — R1011 Right upper quadrant pain: Secondary | ICD-10-CM | POA: Insufficient documentation

## 2019-12-29 MED ORDER — TECHNETIUM TC 99M MEBROFENIN IV KIT
5.1000 | PACK | Freq: Once | INTRAVENOUS | Status: AC | PRN
  Administered 2019-12-29: 5.1 via INTRAVENOUS

## 2019-12-29 MED ORDER — FLUDEOXYGLUCOSE F - 18 (FDG) INJECTION: 5.1000 | Freq: Once | INTRAVENOUS | Status: DC | PRN

## 2020-01-05 ENCOUNTER — Telehealth: Payer: Self-pay | Admitting: Gastroenterology

## 2020-01-05 NOTE — Telephone Encounter (Signed)
Spoke with pt and now he states that he has not drank alcohol in over a year. States it used to hurt his pancreas when he drank but now he has the same pain and is not drinking. Pt does not want to come in for another visit, he does not want to pay for another visit. He wants to know what the next test/lab/xray may be because he reports nothing has changed with his symptoms or condition. Please advise.

## 2020-01-05 NOTE — Telephone Encounter (Signed)
He may be damaging his pancreas with alcohol.  I recommend that he stop drinking alcohol - although this should not be done abruptly. He may want to discuss options for a taper with his primary care provider. Not drinking alcohol is the most important step in protecting his pancreas. He should keep his appointment 01/10/20 so that we determine what, if any, testing would be most appropriate at this time. Thank you.

## 2020-01-05 NOTE — Telephone Encounter (Signed)
Pt aware. States he cannot come to his scheduled appt and requests a sooner one. Dr. Tarri Glenn has a cancellation tomorrow at 10:10am. Pt will come 01/06/20 at 10:10am. Pt aware.

## 2020-01-05 NOTE — Telephone Encounter (Signed)
Pt states he will not be able to come in for his OV with Janett Billow. States he does not see what good the OV will be. He wants to know if some tests can be done, he thinks his pancreas is the problem. States when he drinks any liquor within a short amount of time he has pain. Reports he is not having pain right now but he feels more tests need to be done, does not feel he needs an office visit. Please advise.

## 2020-01-05 NOTE — Telephone Encounter (Signed)
I am glad that he stopped drinking alcohol. I recommend a follow-up visit so that we can determine the next best step in evaluating and treating his symptoms. Thank you.

## 2020-01-06 ENCOUNTER — Encounter: Payer: Self-pay | Admitting: Gastroenterology

## 2020-01-06 ENCOUNTER — Other Ambulatory Visit (INDEPENDENT_AMBULATORY_CARE_PROVIDER_SITE_OTHER): Payer: Managed Care, Other (non HMO)

## 2020-01-06 ENCOUNTER — Ambulatory Visit: Payer: Medicaid Other | Admitting: Gastroenterology

## 2020-01-06 VITALS — BP 128/82 | HR 95 | Ht 74.0 in | Wt 231.4 lb

## 2020-01-06 DIAGNOSIS — R109 Unspecified abdominal pain: Secondary | ICD-10-CM

## 2020-01-06 DIAGNOSIS — R112 Nausea with vomiting, unspecified: Secondary | ICD-10-CM | POA: Diagnosis not present

## 2020-01-06 LAB — BUN: BUN: 16 mg/dL (ref 6–23)

## 2020-01-06 LAB — CREATININE, SERUM: Creatinine, Ser: 1.21 mg/dL (ref 0.40–1.50)

## 2020-01-06 MED ORDER — SUCRALFATE 1 G PO TABS
1.0000 g | ORAL_TABLET | Freq: Four times a day (QID) | ORAL | 11 refills | Status: DC
Start: 2020-01-06 — End: 2020-05-09

## 2020-01-06 NOTE — Patient Instructions (Addendum)
If you are age 46 or older, your body mass index should be between 23-30. Your Body mass index is 29.71 kg/m. If this is out of the aforementioned range listed, please consider follow up with your Primary Care Provider.  If you are age 46 or younger, your body mass index should be between 19-25. Your Body mass index is 29.71 kg/m. If this is out of the aformentioned range listed, please consider follow up with your Primary Care Provider.   Continue to take your omeprazole 40 mg twice daily.  Use the dicylomine 20 mg up to four times daily as needed for abdominal pain.  I am prescribing Carafate. This is a liquid that can be used up to four times daily to coat your upper GI tract and hopefully provide relief when needed.  I do not recommend or prescribe narcotic pain medications for abdominal pain.   Your symptoms may be due to marijuana use. I recommend abstaining from all marijuana for at least 2 weeks to see if this may provide some symptomatic relief.   I have recommended a stool test and a CT scan to further evaluate your pancrease.  You have been scheduled for a CT scan of the abdomen and pelvis at [facility], 1st floor Radiology. You are scheduled on 01-17-20  at 8:00 am. You should arrive 15 minutes prior to your appointment time for registration. Please follow the written instructions below on the day of your exam:   1) Do not eat anything after 4:00am (4 hours prior to your test)    You may take any medications as prescribed with a small amount of water, if necessary. If you take any of the following medications: METFORMIN, GLUCOPHAGE, GLUCOVANCE, AVANDAMET, RIOMET, FORTAMET, [place]ina MET, JANUMET, GLUMETZA or METAGLIP, you MAY be asked to HOLD this medication 48 hours AFTER the exam.   The purpose of you drinking the oral contrast is to aid in the visualization of your intestinal tract. The contrast solution may cause some diarrhea. Depending on your individual set of symptoms, you  may also receive an intravenous injection of x-ray contrast/dye. Plan on being at [facility] for 45 minutes or longer, depending on the type of exam you are having performed.   If you have any questions regarding your exam or if you need to reschedule, you may call [facility] at [phone] between the hours of 8:00 am and 5:00 pm, Monday-Friday.   Your provider has requested that you go to the basement level for lab work before leaving today. Press "B" on the elevator. The lab is located at the first door on the left as you exit the elevator.  Due to recent changes in healthcare laws, you may see the results of your imaging and laboratory studies on MyChart before your provider has had a chance to review them.  We understand that in some cases there may be results that are confusing or concerning to you. Not all laboratory results come back in the same time frame and the provider may be waiting for multiple results in order to interpret others.  Please give [place] 48 hours in order for your provider to thoroughly review all the results before contacting the office for clarification of your results.   Thank you for entrusting me with your care and choosing [facility].  Dr Tarri Glenn

## 2020-01-06 NOTE — Progress Notes (Signed)
Referring Provider: Carlena Hurl, PA-C Primary Care Physician:  Carlena Hurl, PA-C  Chief complaint:  Abdominal pain   IMPRESSION:  Intermittent abdominal pain with associated nausea and vomiting Reflux esophagitis and H pylori negative gastritis on EGD 12/06/19 Patient concerns about pancreatitis Daily marijuana use No alcohol in one year Diabetes with HgbA1C 9.4 09/2019  Known esophagitis and gastritis: Continue PPI therapy.   Abdominal pain with associated nausea and vomiting: May be due to esophagitis and gastritis. Evaluation for symptomatic gallbladder disease was negative. Must also consider cannabinoid hyperemesis, gastroparesis, cyclic vomiting syndrome, functional abdominal pain. I have made it clear that I do not prescribe opiates for chronic abdominal pain and reassured him that we would look for other ways to manage his symptoms.  PLAN: - I encourage him to use dicyclomine 20 mg QID - Trial of Carafate 1 gram slurry QID - Fecal elastase given patient concerns about pancreatic disease - CT abd/pelvis with contrast to evaluate the pancreas - Abstinence from marijuana for at least 2 weeks - Follow-up after the CT   Please see the "Patient Instructions" section for addition details about the plan.  HPI: Dustin Zamora is a 44 y.o. male who called yesterday requesting a more urgent follow-up. Initially seen in consultation 10/20/19 by Alonza Bogus for several years of intermittent, predominantly RUQ abdominal pain, nausea, and vomiting.   Evaluation of his symptoms has included: Labs 2020-2021: normal liver enzymes, normal lipase, normal TSH Most recently HgbA1C 9.4 Ultrasound 11/02/19: normal, no source for pain identified EGD 12/06/19: GERD. Chronic inactive gastritis. Normal duodenal biopsies. No EOE. No H pylori.  Colonoscopy 12/06/19: Two hyperplastic polyps, ascending colon diverticulosis HIDA with CCK 12/29/2019: Normal with gallbladder ejection  fraction  Returns today requesting opiates to manage his symptoms so he wouldn't need to find medications to control his pain on the street. Continues to have intermittent bouts of symptoms that are severe and can last up to 3 weeks at a time. He is concerned that they are occurring more frequently. Episodes start with epigastric acid, gurgling, and eructation followed by abdominal pain. Feels like he would feel better if he could have flatus, but, he doesn't. He is concerned that his pancreas is inflammed because his bowels are stringy for a couple of daily. Loses 10 pounds during these episodes because of the severity of these symptoms.  Drinks water to stay hydrated during the episodes. Will have nausea and vomiting if he tries to eat.  Pain somewhat improved since increasing the omeprazole to 40 mg BID. He will try vinegar and baking soda during the episodes in addition to omeprazole. Occasionally uses dicyclomine for symptom management.   Previously weighed 310. Average weight is 235 pounds.   Concerned that he damaged his pancreas and this is the intermittent problem.  No alcohol in one year. Uses marijuana daily. Denies any other street drugs.     Past Medical History:  Diagnosis Date  . Anxiety   . Chronic back pain   . Depression    in remote past  . Diabetes mellitus without complication ([place]) 1937  . Hyperlipidemia 2008  . Hypertension 2008  . Keloid   . Microalbuminuria   . Smoker   . Wears glasses     Past Surgical History:  Procedure Laterality Date  . ANTERIOR CRUCIATE LIGAMENT REPAIR     right ACL repair, patellar repair, remove cartilage damage; 44yo    Current Outpatient Medications  Medication Sig Dispense Refill  .  buPROPion (WELLBUTRIN XL) 150 MG 24 hr tablet Take 1 tablet (150 mg total) by mouth daily. 90 tablet 1  . dapagliflozin propanediol (FARXIGA) 10 MG TABS tablet Take 10 mg by mouth daily before breakfast. 90 tablet 1  . dicyclomine (BENTYL) 10 MG  capsule TAKE 1 CAPSULE (10 MG TOTAL) BY MOUTH IN THE MORNING, AT NOON, IN THE EVENING, AND AT BEDTIME. 120 capsule 1  . Insulin Glargine-Lixisenatide (SOLIQUA) 100-33 UNT-MCG/ML SOPN Inject 25 Units into the skin daily. 3 mL 2  . Insulin Pen Needle (BD PEN NEEDLE NANO U/F) 32G X 4 MM MISC 1 each by Does not apply route at bedtime. 100 each 11  . lisinopril (ZESTRIL) 10 MG tablet Take 1 tablet (10 mg total) by mouth daily. 90 tablet 3  . metFORMIN (GLUCOPHAGE) 1000 MG tablet Take 1,000 mg by mouth 2 (two) times daily.    Marland Kitchen omeprazole (PRILOSEC) 40 MG capsule Increase Omeprazole to 40 mg twice a day for 8 weeks starting 5/ 25/2021 90 capsule 3  . rosuvastatin (CRESTOR) 20 MG tablet Take 1 tablet (20 mg total) by mouth at bedtime. 90 tablet 3  . sildenafil (VIAGRA) 100 MG tablet Take 1 tablet (100 mg total) by mouth daily as needed. 10 tablet 4   No current facility-administered medications for this visit.    Allergies as of 01/06/2020 - Review Complete 01/06/2020  Allergen Reaction Noted  . Penicillins Anaphylaxis     Family History  Problem Relation Age of Onset  . Kidney disease Father   . Vascular Disease Father   . Stroke Paternal Grandmother   . Diabetes Paternal Grandmother   . Cancer Neg Hx   . Heart disease Neg Hx   . Colon cancer Neg Hx   . Rectal cancer Neg Hx   . Stomach cancer Neg Hx     Social History   Socioeconomic History  . Marital status: Married    Spouse name: Not on file  . Number of children: Not on file  . Years of education: Not on file  . Highest education level: Not on file  Occupational History  . Not on file  Tobacco Use  . Smoking status: Current Every Day Smoker    Packs/day: 0.50    Years: 25.00    Pack years: 12.50    Types: Cigars, Cigarettes  . Smokeless tobacco: Never Used  Vaping Use  . Vaping Use: Never used  Substance and Sexual Activity  . Alcohol use: Not Currently    Alcohol/week: 4.0 standard drinks    Types: 4 Shots of  liquor per week  . Drug use: Yes    Frequency: 7.0 times per week    Types: Marijuana    Comment: last used 12/04/19  . Sexual activity: Not on file  Other Topics Concern  . Not on file  Social History Narrative   Lives at home with wife Brayton Layman, 2 daughters, exercises with cardio, some weights.  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  Working in Scientist, research (life sciences) estate currently.   09/2019   Social Determinants of Health   Financial Resource Strain:   . Difficulty of Paying Living Expenses:   Food Insecurity:   . Worried About Charity fundraiser in the Last Year:   . Arboriculturist in the Last Year:   Transportation Needs:   . Film/video editor (Medical):   Marland Kitchen Lack of Transportation (Non-Medical):   Physical Activity:   . Days of Exercise per Week:   .  Minutes of Exercise per Session:   Stress:   . Feeling of Stress :   Social Connections:   . Frequency of Communication with Friends and Family:   . Frequency of Social Gatherings with Friends and Family:   . Attends Religious Services:   . Active Member of Clubs or Organizations:   . Attends Archivist Meetings:   Marland Kitchen Marital Status:   Intimate Partner Violence:   . Fear of Current or Ex-Partner:   . Emotionally Abused:   Marland Kitchen Physically Abused:   . Sexually Abused:     Review of Systems: 12 system ROS is negative except as noted above.   Physical Exam: General:   Alert,  well-nourished, pleasant and cooperative in NAD Head:  Normocephalic and atraumatic. Eyes:  Sclera clear, no icterus.   Conjunctiva pink. Ears:  Normal auditory acuity. Nose:  No deformity, discharge,  or lesions. Mouth:  No deformity or lesions.   Neck:  Supple; no masses or thyromegaly. Lungs:  Clear throughout to auscultation.   No wheezes. Heart:  Regular rate and rhythm; no murmurs. Abdomen:  Soft,nontender, nondistended, normal bowel sounds, no rebound or guarding. No hepatosplenomegaly.   Rectal:  Deferred  Msk:  Symmetrical. No boney  deformities LAD: No inguinal or umbilical LAD Extremities:  No clubbing or edema. Neurologic:  Alert and  oriented x4;  grossly nonfocal Skin:  Intact without significant lesions or rashes. Psych:  Alert and cooperative. Normal mood and affect.   Renessa Wellnitz L. Tarri Glenn, MD, MPH 01/06/2020, 10:19 AM

## 2020-01-10 ENCOUNTER — Telehealth: Payer: Self-pay | Admitting: Gastroenterology

## 2020-01-10 ENCOUNTER — Ambulatory Visit: Payer: Medicaid Other | Admitting: Gastroenterology

## 2020-01-10 NOTE — Telephone Encounter (Signed)
Dustin Zamora called to advise that they are willing to approve CT of Abdomin only not with the pelvis due to some findings by their provider. If that is not ok with Dustin Zamora they will have to do a peer to peer.

## 2020-01-11 ENCOUNTER — Other Ambulatory Visit: Payer: Self-pay

## 2020-01-11 DIAGNOSIS — R109 Unspecified abdominal pain: Secondary | ICD-10-CM

## 2020-01-11 NOTE — Telephone Encounter (Signed)
Okay. Please update the order. Thank you.

## 2020-01-11 NOTE — Telephone Encounter (Signed)
New order for CT Abdomen with contrast in epic. Appt scheduled for 01/17/20 at 8 am, with 7:45 am arrival time. NPO 4 hours prior. Called patient to inform of appointment, pt states that he is out of town and may need to reschedule. I have provided patient with radiology scheduling number if he needs to reschedule.

## 2020-01-12 LAB — PANCREATIC ELASTASE, FECAL: Pancreatic Elastase-1, Stool: 130 mcg/g — ABNORMAL LOW

## 2020-01-17 ENCOUNTER — Ambulatory Visit (HOSPITAL_COMMUNITY): Payer: Managed Care, Other (non HMO)

## 2020-01-17 ENCOUNTER — Other Ambulatory Visit (HOSPITAL_COMMUNITY): Payer: Managed Care, Other (non HMO)

## 2020-01-19 ENCOUNTER — Ambulatory Visit (HOSPITAL_COMMUNITY)
Admission: RE | Admit: 2020-01-19 | Discharge: 2020-01-19 | Disposition: A | Discharge status: Home / Self Care | Payer: Managed Care, Other (non HMO) | Source: Ambulatory Visit | Attending: Gastroenterology | Admitting: Gastroenterology

## 2020-01-19 ENCOUNTER — Encounter (HOSPITAL_COMMUNITY): Payer: Self-pay

## 2020-01-19 ENCOUNTER — Other Ambulatory Visit: Payer: Self-pay

## 2020-01-19 DIAGNOSIS — R109 Unspecified abdominal pain: Secondary | ICD-10-CM | POA: Insufficient documentation

## 2020-01-19 MED ORDER — SODIUM CHLORIDE (PF) 0.9 % IJ SOLN
INTRAMUSCULAR | Status: AC
  Filled 2020-01-19: qty 50

## 2020-01-19 MED ORDER — IOHEXOL 300 MG/ML  SOLN
100.0000 mL | Freq: Once | INTRAMUSCULAR | Status: AC | PRN
  Administered 2020-01-19: 100 mL via INTRAVENOUS

## 2020-01-24 ENCOUNTER — Telehealth: Payer: Self-pay | Admitting: Gastroenterology

## 2020-01-24 NOTE — Telephone Encounter (Signed)
Attempted to call pt but was unable to reach pt or leave a message on his voicemail as it was full.

## 2020-01-25 NOTE — Telephone Encounter (Signed)
The CT scan evaluates the shape and size of the pancrease and looks for calcifications. Those were not seen. The next step would be an MRI/MRCP to evaluate the ducts that go to and through the pancreas. Please schedule. I'd be happy to see him in the office to discuss first if he would prefer. Thank you.

## 2020-01-25 NOTE — Telephone Encounter (Signed)
Discussed CT results with pt. He wants to know what the next step is with his care. He is confused as the stool test showed possible pancreatic insufficiency but CT did not show anything. Let him know he was to abstain from alcohol regarding the lab result but the CT did not show problems with pancreas. Please advise.

## 2020-01-25 NOTE — Telephone Encounter (Signed)
Attempted to call pt, no answer and unable to leave a message.

## 2020-01-25 NOTE — Telephone Encounter (Signed)
Pt wishes to proceed with MR/MRI. Will schedule tomorrow and call pt.

## 2020-01-27 ENCOUNTER — Other Ambulatory Visit: Payer: Self-pay

## 2020-01-27 DIAGNOSIS — R109 Unspecified abdominal pain: Secondary | ICD-10-CM

## 2020-01-27 NOTE — Telephone Encounter (Signed)
Pt scheduled for MR/MRCP at [facility] 02/08/20@8am , pt to arrive there at 7:30am and be NPO after midnight. Pt aware.

## 2020-02-08 ENCOUNTER — Other Ambulatory Visit: Payer: Self-pay | Admitting: Gastroenterology

## 2020-02-08 ENCOUNTER — Ambulatory Visit (HOSPITAL_COMMUNITY): Admission: RE | Admit: 2020-02-08 | Payer: Medicaid Other | Source: Ambulatory Visit

## 2020-02-08 DIAGNOSIS — R109 Unspecified abdominal pain: Secondary | ICD-10-CM

## 2020-02-19 ENCOUNTER — Other Ambulatory Visit: Payer: Self-pay | Admitting: Gastroenterology

## 2020-02-23 ENCOUNTER — Other Ambulatory Visit: Payer: Self-pay | Admitting: Medical

## 2020-03-06 ENCOUNTER — Other Ambulatory Visit: Payer: Self-pay | Admitting: Gastroenterology

## 2020-04-09 ENCOUNTER — Ambulatory Visit (INDEPENDENT_AMBULATORY_CARE_PROVIDER_SITE_OTHER): Payer: Managed Care, Other (non HMO) | Admitting: Family Medicine

## 2020-04-09 ENCOUNTER — Other Ambulatory Visit: Payer: Self-pay

## 2020-04-09 ENCOUNTER — Encounter: Payer: Self-pay | Admitting: Family Medicine

## 2020-04-09 VITALS — BP 128/80 | HR 93 | Temp 97.4°F | Wt 233.6 lb

## 2020-04-09 DIAGNOSIS — I152 Hypertension secondary to endocrine disorders: Secondary | ICD-10-CM

## 2020-04-09 DIAGNOSIS — N529 Male erectile dysfunction, unspecified: Secondary | ICD-10-CM | POA: Diagnosis not present

## 2020-04-09 DIAGNOSIS — E1159 Type 2 diabetes mellitus with other circulatory complications: Secondary | ICD-10-CM

## 2020-04-09 DIAGNOSIS — I7300 Raynaud's syndrome without gangrene: Secondary | ICD-10-CM | POA: Diagnosis not present

## 2020-04-09 DIAGNOSIS — E118 Type 2 diabetes mellitus with unspecified complications: Secondary | ICD-10-CM | POA: Diagnosis not present

## 2020-04-09 DIAGNOSIS — I10 Essential (primary) hypertension: Secondary | ICD-10-CM

## 2020-04-09 DIAGNOSIS — IMO0002 Reserved for concepts with insufficient information to code with codable children: Secondary | ICD-10-CM

## 2020-04-09 DIAGNOSIS — E1165 Type 2 diabetes mellitus with hyperglycemia: Secondary | ICD-10-CM

## 2020-04-09 MED ORDER — TADALAFIL 20 MG PO TABS: 20.0000 mg | ORAL_TABLET | Freq: Every day | ORAL | 0 refills | Status: DC | PRN

## 2020-04-09 NOTE — Progress Notes (Signed)
   Subjective:    Patient ID: Dustin Zamora, male    DOB: 22-Jul-1975, 44 y.o.   MRN: [id number]  HPI He came here for an acute visit because of concern over blood pressure.  He states that he felt some slight chest discomfort thinking it was his blood pressure and then doubled the dose for a while but presently is back on his regular dosing.  He is no longer having any chest discomfort.  He then described difficulty with white fingertips especially when he gets cold.  He states that this has been going on for several years.  He mentioned the fact that sildenafil is apparently not helping with his erectile dysfunction. Review of his record also indicates that he is not under adequate control for his diabetes.  Review of Systems     Objective:   Physical Exam Alert and in no distress.  Cardiac exam shows regular rhythm without murmurs or gallops.  Lungs clear to auscultation.       Assessment & Plan:  Hypertension associated with diabetes ([place])  Raynaud's disease without gangrene  Erectile dysfunction, unspecified erectile dysfunction type  Uncontrolled type 2 diabetes mellitus with complication, without long-term current use of insulin ([place]) I explained that his blood pressure is now fine and had nothing to do with his chest discomfort.  Difficult to say what this was from as it was very vague. I explained that he does have Raynaud's disease since his been going on a long time I do not think that this is of any major concern other than keeping his hands warm. I will switch him to Cialis to see if that will help with his erectile dysfunction.  He is to then set up an appointment for follow-up concerning his diabetes.

## 2020-04-20 ENCOUNTER — Telehealth: Payer: Self-pay | Admitting: Medical

## 2020-04-20 NOTE — Telephone Encounter (Signed)
Please call re his testosterone results

## 2020-04-20 NOTE — Telephone Encounter (Signed)
Please call patient and find out what questions he has.  I do not see a recent testosterone level in the chart

## 2020-04-23 NOTE — Telephone Encounter (Signed)
Informed patient of his testosterone result this morning from March.

## 2020-05-08 ENCOUNTER — Ambulatory Visit: Payer: Managed Care, Other (non HMO) | Admitting: Medical

## 2020-05-09 ENCOUNTER — Ambulatory Visit (INDEPENDENT_AMBULATORY_CARE_PROVIDER_SITE_OTHER): Payer: Managed Care, Other (non HMO) | Admitting: Medical

## 2020-05-09 ENCOUNTER — Encounter: Payer: Self-pay | Admitting: Medical

## 2020-05-09 ENCOUNTER — Other Ambulatory Visit: Payer: Self-pay

## 2020-05-09 VITALS — BP 146/92 | HR 82 | Ht 74.0 in | Wt 237.8 lb

## 2020-05-09 DIAGNOSIS — R6882 Decreased libido: Secondary | ICD-10-CM

## 2020-05-09 DIAGNOSIS — E1165 Type 2 diabetes mellitus with hyperglycemia: Secondary | ICD-10-CM | POA: Diagnosis not present

## 2020-05-09 DIAGNOSIS — I700 Atherosclerosis of aorta: Secondary | ICD-10-CM | POA: Insufficient documentation

## 2020-05-09 DIAGNOSIS — E785 Hyperlipidemia, unspecified: Secondary | ICD-10-CM | POA: Diagnosis not present

## 2020-05-09 DIAGNOSIS — Z7185 Encounter for immunization safety counseling: Secondary | ICD-10-CM

## 2020-05-09 DIAGNOSIS — Z23 Encounter for immunization: Secondary | ICD-10-CM | POA: Insufficient documentation

## 2020-05-09 DIAGNOSIS — Z720 Tobacco use: Secondary | ICD-10-CM

## 2020-05-09 DIAGNOSIS — E1159 Type 2 diabetes mellitus with other circulatory complications: Secondary | ICD-10-CM

## 2020-05-09 DIAGNOSIS — Z794 Long term (current) use of insulin: Secondary | ICD-10-CM

## 2020-05-09 DIAGNOSIS — F411 Generalized anxiety disorder: Secondary | ICD-10-CM

## 2020-05-09 DIAGNOSIS — R5383 Other fatigue: Secondary | ICD-10-CM

## 2020-05-09 DIAGNOSIS — E119 Type 2 diabetes mellitus without complications: Secondary | ICD-10-CM

## 2020-05-09 DIAGNOSIS — F419 Anxiety disorder, unspecified: Secondary | ICD-10-CM

## 2020-05-09 DIAGNOSIS — N529 Male erectile dysfunction, unspecified: Secondary | ICD-10-CM

## 2020-05-09 DIAGNOSIS — I152 Hypertension secondary to endocrine disorders: Secondary | ICD-10-CM

## 2020-05-09 DIAGNOSIS — R809 Proteinuria, unspecified: Secondary | ICD-10-CM

## 2020-05-09 LAB — POCT GLYCOSYLATED HEMOGLOBIN (HGB A1C): Hemoglobin A1C: 7.6 % — AB (ref 4.0–5.6)

## 2020-05-09 MED ORDER — BUPROPION HCL ER (XL) 150 MG PO TB24: 150.0000 mg | ORAL_TABLET | Freq: Every day | ORAL | 1 refills | Status: DC

## 2020-05-09 MED ORDER — SOLIQUA 100-33 UNT-MCG/ML ~~LOC~~ SOPN: 28.0000 [IU] | PEN_INJECTOR | Freq: Every day | SUBCUTANEOUS | 5 refills | Status: DC

## 2020-05-09 MED ORDER — METFORMIN HCL 850 MG PO TABS
850.0000 mg | ORAL_TABLET | Freq: Two times a day (BID) | ORAL | 1 refills | Status: DC
Start: 2020-05-09 — End: 2020-11-02

## 2020-05-09 MED ORDER — OLMESARTAN MEDOXOMIL 20 MG PO TABS: 20.0000 mg | ORAL_TABLET | Freq: Every day | ORAL | 3 refills | Status: DC

## 2020-05-09 NOTE — Progress Notes (Signed)
Subjective:   Dustin Zamora is an 44 y.o. male who presents for follow up of Type 2 diabetes mellitus and med check.   Date of diagnosis of diabetes: 2008 Current treatments: Soliqua injection, 25 u daily, Metformin 850mg  BID, Farxiga 10 mg daily Medication compliance: good  Patient is checking home blood sugars some.   Home blood sugar records: in the mid 100s Current symptoms include: nocturia, some bilat arm numbness. Patient denies hypoglycemia , paresthesia of the feet, polydipsia and visual disturbances.  Patient is checking their feet daily. Foot concerns (callous, ulcer, wound, thickened nails, toenail fungus, skin fungus, hammer toe): none Last dilated eye exam - year ago Current diet:trying to eat healthy Current exercise: - not enough cardio.    He and wife recently split.  She has mental health issues, and he finally parted ways with her recently  Hyperlipidemia -compliant with Crestor 20 mg daily without complaint  Hypertension-compliant with lisinopril 10 mg daily without complaint.  Checks BP at home.   Fluctuates at home.     Anxiety-taking Wellbutrin XL 150 mg daily  ED - not happy with erections.  Feels like medication he has tried has not been helpful despite numerous medication trials.  Can't get full erections.  Feels fatigued.  He still worries about his testosterone being low.   Past Medical History:  Diagnosis Date  . Anxiety   . Chronic back pain   . Depression    in remote past  . Diabetes mellitus without complication ([place]) 3785  . Hyperlipidemia 2008  . Hypertension 2008  . Keloid   . Microalbuminuria   . Smoker   . Wears glasses     Current Outpatient Medications on File Prior to Visit  Medication Sig Dispense Refill  . FARXIGA 10 MG TABS tablet TAKE 10 MG BY MOUTH DAILY BEFORE BREAKFAST. 90 tablet 1  . Insulin Pen Needle (BD PEN NEEDLE NANO U/F) 32G X 4 MM MISC 1 each by Does not apply route at bedtime. 100 each 11  . omeprazole  (PRILOSEC) 40 MG capsule Increase Omeprazole to 40 mg twice a day for 8 weeks starting 5/ 25/2021 90 capsule 3  . rosuvastatin (CRESTOR) 20 MG tablet Take 1 tablet (20 mg total) by mouth at bedtime. 90 tablet 3  . [DISCONTINUED] dicyclomine (BENTYL) 10 MG capsule TAKE 1 CAPSULE (10 MG TOTAL) BY MOUTH IN THE MORNING, AT NOON, IN THE EVENING, AND AT BEDTIME. 120 capsule 1  . [DISCONTINUED] dicyclomine (BENTYL) 10 MG capsule TAKE 1 CAPSULE (10 MG TOTAL) BY MOUTH IN THE MORNING, AT NOON, IN THE EVENING, AND AT BEDTIME. 120 capsule 1   No current facility-administered medications on file prior to visit.     The following portions of the patient's history were reviewed and updated as appropriate: allergies, current medications, past family history, past medical history, past social history, past surgical history and problem list.  ROS as in subjective above    Objective:   BP (!) 146/92   Pulse 82   Ht 6\' 2"  (1.88 m)   Wt 237 lb 12.8 oz (107.9 kg)   SpO2 97%   BMI 30.53 kg/m   BP Readings from Last 3 Encounters:  05/09/20 (!) 146/92  04/09/20 128/80  01/06/20 128/82   Wt Readings from Last 3 Encounters:  05/09/20 237 lb 12.8 oz (107.9 kg)  04/09/20 233 lb 9.6 oz (106 kg)  01/06/20 231 lb 6 oz (105 kg)   General appearence: alert, no distress, WD/WN,  Neck: supple, no lymphadenopathy, no thyromegaly, no masses Heart: RRR, normal S1, S2, no murmurs Lungs: CTA bilaterally, no wheezes, rhonchi, or rales Pulses: 2+ symmetric, upper and lower extremities, normal cap refill   Diabetic Foot Exam - Simple   Simple Foot Form Visual Inspection No deformities, no ulcerations, no other skin breakdown bilaterally: Yes Sensation Testing Intact to touch and monofilament testing bilaterally: Yes Pulse Check Posterior Tibialis and Dorsalis pulse intact bilaterally: Yes Comments     Assessment:   Encounter Diagnoses  Name Primary?  Marland Kitchen Uncontrolled type 2 diabetes mellitus with  hyperglycemia ([place]) Yes  . Insulin dependent type 2 diabetes mellitus ([place])   . Hypertension associated with diabetes ([place])   . Hyperlipidemia, unspecified hyperlipidemia type   . Anxiety   . Tobacco use   . Vaccine counseling   . Microalbuminuria   . Generalized anxiety disorder   . Erectile dysfunction, unspecified erectile dysfunction type   . Need for influenza vaccination   . Need for vaccination   . Low libido   . Fatigue, unspecified type   . Aortic atherosclerosis (HCC)      Plan:   Diabetes Mellitus type 2: Education: Reviewed 'ABCs' of diabetes management (respective goals in parentheses):  A1C (<7), blood pressure (<130/80), and cholesterol (LDL <100)  Diabetes: Your diabetes number is much improved today around 7% compared to over 9% last time  Increase the Soliqua to 28 units daily  Continue Metformin 850 mg twice daily  Continue Farxiga 10 mg daily   Blood pressure  Stop the lisinopril and make sure your pharmacy is aware we stopped this today  Change to olmesartan 20 mg daily also known as Benicar.  If this is too expensive let me know right away.  This is similar to lisinopril but usually better tolerated in African-Americans, and I think this will work better overall for your blood pressure   High cholesterol and cholesterol buildup in your artery called atherosclerosis  Continue Crestor rosuvastatin 20 mg daily   Continue Wellbutrin for mood and to help quit smoking   ED - discussed concerns, prior treatment with no good response, prior testosterone level.  Return soon for repeat blood test for testosterone at his request.    Diabetes Education today included:  interpretation of lab results, blood sugar goals, complications of diabetes mellitus, hypoglycemia prevention and treatment, exercise, self-monitoring of blood glucose skills and nutrition Advised daily foot checks, yearly diabetic eye exams, dental hygiene Compliance at present is  estimated to be good. Efforts to improve compliance (if necessary) will be directed at dietary modifications: less animal products.   Vaccines: Up-to-date on tetanus and pneumococcal vaccine Recommended flu vaccine, Covid vaccine, hepatitis vaccine  Counseled on the influenza virus vaccine.  Vaccine information sheet given.  Influenza vaccine given after consent obtained.  Counseled on [employer] Covid virus vaccine.  Vaccine information sheet given.  Covid vaccine booster given after consent obtained.   Rivers was seen today for diabetes.  Diagnoses and all orders for this visit:  Uncontrolled type 2 diabetes mellitus with hyperglycemia (HCC) -     HgB A1c -     Testosterone; Future  Insulin dependent type 2 diabetes mellitus (HCC) -     Testosterone; Future  Hypertension associated with diabetes ([place])  Hyperlipidemia, unspecified hyperlipidemia type  Anxiety  Tobacco use  Vaccine counseling -     Pfizer SARS-COV-2 Vaccine  Microalbuminuria  Generalized anxiety disorder  Erectile dysfunction, unspecified erectile dysfunction type  Need for influenza  vaccination -     [employer] Vaccine  Need for vaccination -     [employer] Vaccine  Low libido -     Testosterone; Future  Fatigue, unspecified type -     Testosterone; Future  Aortic atherosclerosis (HCC)  Other orders -     buPROPion (WELLBUTRIN XL) 150 MG 24 hr tablet; Take 1 tablet (150 mg total) by mouth daily. -     Insulin Glargine-Lixisenatide (SOLIQUA) 100-33 UNT-MCG/ML SOPN; Inject 28 Units into the skin daily. -     metFORMIN (GLUCOPHAGE) 850 MG tablet; Take 1 tablet (850 mg total) by mouth 2 (two) times daily with a meal. -     olmesartan (BENICAR) 20 MG tablet; Take 1 tablet (20 mg total) by mouth daily. -     Flu Vaccine QUAD 6+ mos PF IM (Fluarix Quad PF)    Follow up: 3 months

## 2020-05-09 NOTE — Patient Instructions (Signed)
Diabetes: Your diabetes number is much improved today around 7% compared to over 9% last time  Increase the Soliqua to 28 units daily  Continue Metformin 850 mg twice daily  Continue Farxiga 10 mg daily   Blood pressure  Stop the lisinopril and make sure your pharmacy is aware we stopped this today  Change to olmesartan 20 mg daily also known as Benicar.  If this is too expensive let me know right away.  This is similar to lisinopril but usually better tolerated in African-Americans, and I think this will work better overall for your blood pressure   High cholesterol and cholesterol buildup in your artery called atherosclerosis  Continue Crestor rosuvastatin 20 mg daily   Continue Wellbutrin for mood and to help quit smoking   Return soon in the afternoon for a blood test for testosterone     Type 2 Diabetes  Diabetes is a long-lasting (chronic) disease.  With diabetes, either the pancreas does not make enough of a hormone called insulin, or the body has trouble using the insulin that is made.  Over time, diabetes can damage the eyes, kidneys, and nerves causing retinopathy, nephropathy, and neuropathy.  Diabetes puts you at risk for heart disease and peripheral vascular disease which can lead to heart attack, stroke, foot ulcers, and amputations.    Our goal and hopefully your goal is to manage your diabetes in such a way to slow the progression of the disease and do all we can to keep you healthy  Home Care:   Eat healthy, exercise regularly, limit alcohol, and don't smoke!  Check your blood sugar (glucose) once a day before breakfast, or as indicated by our discussion today.  The goal is to keep your morning fasting sugar less than 130.  In general you should try to keep your blood sugars between 80 - 130 fasting or before meals.  We also want to avoid hypoglycemia or low blood sugars less than 70.  Make sure you understand how to treat hypoglycemic episodes.  Ask me  about this if we had not discussed this.  If you are on medication, take your medications daily, don't run out of medications.  Learn about low blood sugar (hypoglycemia). Know how to treat it.  Wear a necklace or bracelet that says you have diabetes in the event of emergency for first responders to identify you have diabetes.  Check your feet every night for cuts, sores, blisters, and redness. Tell your medical provider if you have problems.  Maintain a normal body weight, or normal BMI - height to weight ratio of 20-25.  Ask me about this.  GET HELP RIGHT AWAY IF:  You have trouble keeping your blood sugar in target range.  You have problems with your medicines.  You are sick and not getting better after 24 hours.  You have a sore or wound that is not healing.  You have vision problems or changes.  You have a fever.   Exercise regularly since it has beneficial effects on the heart and blood sugars. Exercising at least three times per week or 150 minutes per week can be as important as medication to a diabetic.  Find some form of exercise that you will enjoy doing regularly.  This can include walking, biking, kayaking, golfing, swimming, dance, aerobics, hiking, etc.  If you have joint problems, many local gyms have equipment to accommodate people with specific needs.    Vaccinations:  Diabetics are at increased risk for infection, and  illnesses can take longer to resolve.  Current vaccine recommendations include yearly Influenza (flu) vaccine (recommended in October), Pneumococcal vaccine, Hepatitis B vaccine series, Tdap (tetanus, diptheria and pertussis) vaccine every 10 years, Covid vaccination, and other age appropriate vaccinations.     Office visits:  We recommend routine medical care to make sure we are addressing prevention and issues as they arise.  Typically this could mean twice yearly or up to quarterly depending upon your unique health situation.  Exams should include a  yearly physical, a yearly foot exam, and other examination as appropriate.  You should see an eye doctor yearly to help screen for and prevent blood vessel complications in your eyes.  Labs: Diabetics should have blood work done at least twice yearly to monitor your Hemoglobin A1C (a three-month average of your blood sugars) and your cholesterol.  You should have your urine and blood checked yearly to screen for kidney damage.  This may include creatinine and micro-albumin levels.  Other labs as appropriate.    Blood pressure goals:  Goal blood pressure in diabetics should be 130/80 or less. Monitoring your blood pressure with a home blood pressure cuff of your own is an excellent idea.  If you are prescribed medication for blood pressure, take your medication every day, and don't run out of medication.  Having high blood pressure can damage your heart, eyes, kidneys, and put you at risk for heart attack and stroke.  Tobacco use:  If you smoke, dip or chew, quitting will reduce your risk of heart attack, stroke, peripheral vascular disease, and many cancers.  Other Specialists:   Eye doctor: See your eye doctor yearly for routine eye exam and diabetic eye exam.  Make sure your eye doctor sends [place] a copy of the report  Dentist: See your dentist twice yearly for dental hygiene visits and routine dental care.  Brush and floss your teeth every day.  Dental hygiene is and important part of diabetic care.  Diabetics sometimes see other specialist depending on the complexity of your health issues.    Diabetic Report Card for Dustin Zamora  May 09, 2020  Below is a summary of recent tests related to your diabetes that can help you manage your health.   Hemoglobin A1C:  Your Hemoglobin A1C values should be less than 7. If these are greater than 7, you have a higher chance of having eye, heart, and kidney problems in the future.   Your most recent Hemoglobin A1C values were:  Hgb A1c MFr Bld (%)   Date Value  10/04/2019 9.4 (H)  05/23/2019 9.1 (H)  08/25/2018 9.2 (H)     Cholesterol:  Your LDL Cholesterol (bad cholesterol) values should be less than 100 mg/dL if you do not have cardiovascular disease.  The LDL should be less than 70 mg/dL if you do have cardiovascular disease.  If your LDL is consistently higher than 100 mg/dL, then your risk of heart attack and stroke increases yearly.   Your most recent LDL Cholesterol (bad cholesterol) results were:  LDL Cholesterol (Calc) (mg/dL (calc))  Date Value  05/12/2017 103 (H)   LDL Chol Calc (NIH) (mg/dL)  Date Value  10/04/2019 123 (H)   LDL Calculated (mg/dL)  Date Value  08/25/2018 152 (H)   LDL Cholesterol (mg/dL)  Date Value  08/05/2016 148 (H)  01/29/2015 163 (H)  11/14/2008 62     Your HDL Cholesterol (good cholesterol) values should be higher than 40 mg/dL.  If your HDL  is lower than 40 mg/dL, this increases your risk of heart attack and stroke.     Blood Pressure:  Your blood pressure values should be less than 130/80. Please contact me if your readings at home are consistently higher than this.   Your most recent blood pressure readings at our clinic were:  BP Readings from Last 3 Encounters:  05/09/20 (!) 146/92  04/09/20 128/80  01/06/20 128/82    Urine Protein: Having an elevated microalbumin to creatinine ratio is a marker for early kidney damage due to diabetes or high blood pressure.

## 2020-05-19 ENCOUNTER — Other Ambulatory Visit: Payer: Self-pay | Admitting: Medical

## 2020-05-19 DIAGNOSIS — N529 Male erectile dysfunction, unspecified: Secondary | ICD-10-CM

## 2020-05-21 NOTE — Telephone Encounter (Signed)
Pt. Requesting refill on Sildenafil last apt was 05/09/20

## 2020-06-14 ENCOUNTER — Other Ambulatory Visit: Payer: Self-pay | Admitting: Family Medicine

## 2020-06-14 DIAGNOSIS — N529 Male erectile dysfunction, unspecified: Secondary | ICD-10-CM

## 2020-06-14 NOTE — Telephone Encounter (Signed)
[facility] is requesting to fill pt tadafil. Please advise Kh

## 2020-10-14 ENCOUNTER — Other Ambulatory Visit: Payer: Self-pay | Admitting: Medical

## 2020-10-15 NOTE — Telephone Encounter (Signed)
Schedule fasting CPX , then send in 30 day supply

## 2020-10-15 NOTE — Telephone Encounter (Signed)
Is this ok to refill, reply to [facility]

## 2020-11-02 ENCOUNTER — Other Ambulatory Visit: Payer: Self-pay | Admitting: Medical

## 2020-11-03 ENCOUNTER — Telehealth: Payer: Self-pay

## 2020-11-03 NOTE — Telephone Encounter (Signed)
P.A. SOLIQUA  

## 2020-11-03 NOTE — Telephone Encounter (Signed)
P.A. FARXIGA 

## 2020-11-06 NOTE — Telephone Encounter (Signed)
P.A. denied, pt needs trial of 2 preferred Victoza, Byetta or Trulicity.  Do you want to switch?

## 2020-11-06 NOTE — Telephone Encounter (Signed)
Still not going thru on cover my meds, Called plan t# [phone], pending review.  Left message for pt, have samples to hold pt

## 2020-11-06 NOTE — Telephone Encounter (Signed)
Called plan t# [phone] and step therapy now met and got paid claim on 11/02/20.  Called pharmacy and went thru for $3. Left message for pt

## 2020-11-07 ENCOUNTER — Other Ambulatory Visit: Payer: Self-pay | Admitting: Medical

## 2020-11-07 MED ORDER — TRULICITY 0.75 MG/0.5ML ~~LOC~~ SOAJ
0.7500 mg | SUBCUTANEOUS | 1 refills | Status: DC
Start: 2020-11-07 — End: 2021-01-17

## 2020-11-07 NOTE — Telephone Encounter (Signed)
Let patient know.  Begin trial of Trulicity weekly injection.  Have pharmacist show him how to use this work and come back do nurse visit with nurse here for demonstration  Follow-up in 6 weeks

## 2020-11-12 ENCOUNTER — Telehealth: Payer: Self-pay

## 2020-11-12 NOTE — Telephone Encounter (Signed)
Pt states BP running 111/70 and 140/72 concerned about bottom number being so low.  Should he be concerned?  Only symptom is has issues with arm/hand going numb when laying down. He did schedule diabetes check in 6 weeks.

## 2020-11-12 NOTE — Telephone Encounter (Signed)
Those numbers aren't too low.   Avoid sleeping supine on back.  F/u as scheduled soon to review and discuss further

## 2020-11-13 NOTE — Telephone Encounter (Signed)
Left message for pt

## 2020-11-13 NOTE — Telephone Encounter (Signed)
Called pharmacy went thru for $3.  Pt informed and follow up scheduled

## 2020-11-19 ENCOUNTER — Telehealth: Payer: Self-pay

## 2020-11-19 ENCOUNTER — Other Ambulatory Visit: Payer: Self-pay | Admitting: Medical

## 2020-11-19 DIAGNOSIS — N529 Male erectile dysfunction, unspecified: Secondary | ICD-10-CM

## 2020-11-19 MED ORDER — SILDENAFIL CITRATE 100 MG PO TABS: 100.0000 mg | ORAL_TABLET | Freq: Every day | ORAL | 4 refills | Status: DC | PRN

## 2020-11-19 NOTE — Telephone Encounter (Signed)
Received fax from [facility] for a refill on the pts. Sildenafil but would like a quanitity of 20 sent in pt. Last apt was 05/09/20 and next apt is 12/31/20.

## 2020-11-19 NOTE — Telephone Encounter (Signed)
Done, I changed to #20

## 2020-11-25 ENCOUNTER — Other Ambulatory Visit: Payer: Self-pay | Admitting: Medical

## 2020-11-25 DIAGNOSIS — N529 Male erectile dysfunction, unspecified: Secondary | ICD-10-CM

## 2020-11-27 ENCOUNTER — Other Ambulatory Visit: Payer: Self-pay | Admitting: Medical

## 2020-11-29 ENCOUNTER — Other Ambulatory Visit: Payer: Self-pay | Admitting: Medical

## 2020-12-11 NOTE — Telephone Encounter (Signed)
Pt was switched to Trulicity

## 2020-12-12 NOTE — Telephone Encounter (Signed)
Please schedule diabetes med check fasting.  Send 30-day supply on

## 2020-12-25 ENCOUNTER — Other Ambulatory Visit: Payer: Self-pay | Admitting: Medical

## 2020-12-29 ENCOUNTER — Other Ambulatory Visit: Payer: Self-pay | Admitting: Medical

## 2020-12-30 ENCOUNTER — Other Ambulatory Visit: Payer: Self-pay | Admitting: Gastroenterology

## 2020-12-31 ENCOUNTER — Other Ambulatory Visit: Payer: Self-pay

## 2020-12-31 ENCOUNTER — Encounter: Payer: Self-pay | Admitting: Medical

## 2020-12-31 ENCOUNTER — Ambulatory Visit (INDEPENDENT_AMBULATORY_CARE_PROVIDER_SITE_OTHER): Payer: Medicaid Other | Admitting: Medical

## 2020-12-31 VITALS — BP 120/70 | HR 74 | Ht 74.0 in | Wt 234.4 lb

## 2020-12-31 DIAGNOSIS — I700 Atherosclerosis of aorta: Secondary | ICD-10-CM

## 2020-12-31 DIAGNOSIS — E1159 Type 2 diabetes mellitus with other circulatory complications: Secondary | ICD-10-CM | POA: Diagnosis not present

## 2020-12-31 DIAGNOSIS — Z8719 Personal history of other diseases of the digestive system: Secondary | ICD-10-CM

## 2020-12-31 DIAGNOSIS — F1011 Alcohol abuse, in remission: Secondary | ICD-10-CM | POA: Diagnosis not present

## 2020-12-31 DIAGNOSIS — Z794 Long term (current) use of insulin: Secondary | ICD-10-CM

## 2020-12-31 DIAGNOSIS — B070 Plantar wart: Secondary | ICD-10-CM

## 2020-12-31 DIAGNOSIS — Z87891 Personal history of nicotine dependence: Secondary | ICD-10-CM

## 2020-12-31 DIAGNOSIS — N529 Male erectile dysfunction, unspecified: Secondary | ICD-10-CM

## 2020-12-31 DIAGNOSIS — K219 Gastro-esophageal reflux disease without esophagitis: Secondary | ICD-10-CM

## 2020-12-31 DIAGNOSIS — Z6830 Body mass index (BMI) 30.0-30.9, adult: Secondary | ICD-10-CM

## 2020-12-31 DIAGNOSIS — I152 Hypertension secondary to endocrine disorders: Secondary | ICD-10-CM

## 2020-12-31 DIAGNOSIS — E785 Hyperlipidemia, unspecified: Secondary | ICD-10-CM | POA: Diagnosis not present

## 2020-12-31 DIAGNOSIS — E1165 Type 2 diabetes mellitus with hyperglycemia: Secondary | ICD-10-CM

## 2020-12-31 DIAGNOSIS — F411 Generalized anxiety disorder: Secondary | ICD-10-CM

## 2020-12-31 DIAGNOSIS — R809 Proteinuria, unspecified: Secondary | ICD-10-CM

## 2020-12-31 DIAGNOSIS — E119 Type 2 diabetes mellitus without complications: Secondary | ICD-10-CM

## 2020-12-31 DIAGNOSIS — Z7185 Encounter for immunization safety counseling: Secondary | ICD-10-CM

## 2020-12-31 NOTE — Progress Notes (Signed)
Subjective:   Dustin Zamora is an 45 y.o. male who presents for follow up of Type 2 diabetes mellitus and med check.   Date of diagnosis of diabetes: 2008 Current treatments: Farxiga 10 mg daily, metformin 850 mg twice daily, Trulicity weekly Medication compliance: good Patient is not checking home blood sugars regularly but reports no hypoglycemia.    Current symptoms include: dizziness a few times he attributes to low BP, not low sugar.  No other symptoms of concern. Patient is checking their feet daily. Foot concerns (callous, ulcer, wound, thickened nails, toenail fungus, skin fungus, hammer toe): tender spot on right great toe pad Current diet: not great Current exercise: active, exercising regularly  Hypertension-compliant with olmesartan daily.  He is checking home blood pressure readings most days.   Typically sees 120/70 or 118/67.  He denies chest pain, shortness of breath, edema.   Gets dizzy at times, attributes to BP being low.  He is only taking a piece of a BP pill daily, scared of dizziness.   Hyperlipidemia-he notes that he is Crestor 20 mg some, not every day.  Forgets sometimes.  Uses cod liver oil daily OTC  Anxiety, history of depress ion-compliant with Wellbutrin 150 mg XL daily  GERD-compliant with omeprazole 40 mg daily  Tobacco use-he notes quitting tobacco 3 months ago  Past Medical History:  Diagnosis Date   Anxiety    Chronic back pain    Depression    in remote past   Diabetes mellitus without complication ([place]) 1062   Hyperlipidemia 2008   Hypertension 2008   Keloid    Microalbuminuria    Smoker    Wears glasses     Current Outpatient Medications on File Prior to Visit  Medication Sig Dispense Refill   buPROPion (WELLBUTRIN XL) 150 MG 24 hr tablet Take 1 tablet (150 mg total) by mouth daily. 90 tablet 1   Dulaglutide (TRULICITY) 6.94 WN/4.6EV SOPN Inject 0.75 mg into the skin once a week. 0.5 mL 1   FARXIGA 10 MG TABS tablet TAKE 1 TABLET BY  MOUTH ONCE DAILY BEFORE BREAKFAST. 30 tablet 0   Insulin Pen Needle (BD PEN NEEDLE NANO U/F) 32G X 4 MM MISC 1 each by Does not apply route at bedtime. 100 each 11   metFORMIN (GLUCOPHAGE) 850 MG tablet TAKE 1 TABLET (850 MG TOTAL) BY MOUTH 2 (TWO) TIMES DAILY WITH A MEAL. 60 tablet 0   omeprazole (PRILOSEC) 40 MG capsule Increase Omeprazole to 40 mg twice a day for 8 weeks starting 5/ 25/2021 90 capsule 3   rosuvastatin (CRESTOR) 20 MG tablet TAKE 1 TABLET BY MOUTH EVERYDAY AT BEDTIME 30 tablet 0   sildenafil (VIAGRA) 100 MG tablet TAKE ONE TABLET BY MOUTH DAILY AS NEEDED 15 tablet 1   olmesartan (BENICAR) 20 MG tablet Take 1 tablet (20 mg total) by mouth daily. (Patient not taking: Reported on 12/31/2020) 90 tablet 3   No current facility-administered medications on file prior to visit.     The following portions of the patient's history were reviewed and updated as appropriate: allergies, current medications, past family history, past medical history, past social history, past surgical history and problem list.  ROS as in subjective above    Objective:   BP 120/70   Pulse 74   Ht 6\' 2"  (1.88 m)   Wt 234 lb 6.4 oz (106.3 kg)   SpO2 97%   BMI 30.10 kg/m   BP Readings from Last 3 Encounters:  12/31/20  120/70  05/09/20 (!) 146/92  04/09/20 128/80   Wt Readings from Last 3 Encounters:  12/31/20 234 lb 6.4 oz (106.3 kg)  05/09/20 237 lb 12.8 oz (107.9 kg)  04/09/20 233 lb 9.6 oz (106 kg)    General appearence: alert, no distress, WD/WN,  Neck: supple, no lymphadenopathy, no thyromegaly, no masses, no bruits Heart: RRR, normal S1, S2, no murmurs Lungs: CTA bilaterally, no wheezes, rhonchi, or rales Abdomen: +bs, soft, non tender, non distended, no masses, no hepatomegaly, no splenomegaly Pulses: 2+ symmetric, upper and lower extremities, normal cap refill   Diabetic Foot Exam - Simple   Simple Foot Form Diabetic Foot exam was performed with the following findings: Yes  12/31/2020  9:37 AM  Visual Inspection No deformities, no ulcerations, no other skin breakdown bilaterally: Yes Sensation Testing See comments: Yes Pulse Check See comments: Yes Comments Round tender plantar wart of right great toe volar surface      Assessment:   Encounter Diagnoses  Name Primary?   Hypertension associated with diabetes ([place]) Yes   Hyperlipidemia, unspecified hyperlipidemia type    History of pancreatitis    History of alcohol abuse    Generalized anxiety disorder    Gastroesophageal reflux disease, unspecified whether esophagitis present    Erectile dysfunction, unspecified erectile dysfunction type    Aortic atherosclerosis (HCC)    Vaccine counseling    Uncontrolled type 2 diabetes mellitus with hyperglycemia ([place])    Microalbuminuria    BMI 30.0-30.9,adult    Former smoker    Plantar wart    Insulin dependent type 2 diabetes mellitus ([place])       Plan:   Diabetes Mellitus type 2: Education: Reviewed 'ABCs' of diabetes management (respective goals in parentheses):  A1C (<7), blood pressure (<130/80), and cholesterol (LDL <100)  Diabetes: Medications: continue current [employer] Education today included:  exercise and nutrition Advised daily foot checks, yearly diabetic eye exams, dental hygiene Compliance at present is estimated to be good. Efforts to improve compliance (if necessary) will be directed at dietary modifications: low sugar/low cholesterol, and increased exercise.  Hypertension associated diabetes -advised he bring in his cuff to compared BPs in office as we tend to get high readings compared to his normal or low home readings. He is only taking a fraction of a pill, scared of medicaiton adverse effect.   Advised he bring in his pill bottle when he comes in for BP check to verify what dose he is taking exactly as he has some "old" pill bottle he is using at home.   Hyperlipidemia-continue current medication but change to day time for  better compliance . Of note he is also using OTC cod oil  GERD-continue PPI.  Discussed risk and benefits of medication  Former smoker - congratulated him on his quitting cigarrettes.  He still smokes marijuana.  Plantar wart - dicussed options for therapy.  He will start with OTC wart remover and donut pads.  If not improving, recheck for cryotherapy  Atherosclerosis - c/t statin, advised low cholesterol diet   Follow up: this week with BP check

## 2021-01-01 LAB — HEMOGLOBIN A1C
Est. average glucose Bld gHb Est-mCnc: 180 mg/dL
Hgb A1c MFr Bld: 7.9 % — ABNORMAL HIGH (ref 4.8–5.6)

## 2021-01-01 LAB — LIPID PANEL
Chol/HDL Ratio: 2.7 ratio (ref 0.0–5.0)
Cholesterol, Total: 138 mg/dL (ref 100–199)
HDL: 51 mg/dL (ref >39)
LDL Chol Calc (NIH): 78 mg/dL (ref 0–99)
Triglycerides: 37 mg/dL (ref 0–149)
VLDL Cholesterol Cal: 9 mg/dL (ref 5–40)

## 2021-01-01 LAB — COMPREHENSIVE METABOLIC PANEL
ALT: 21 IU/L (ref 0–44)
AST: 11 IU/L (ref 0–40)
Albumin/Globulin Ratio: 1.8 (ref 1.2–2.2)
Albumin: 4.2 g/dL (ref 4.0–5.0)
Alkaline Phosphatase: 63 IU/L (ref 44–121)
BUN/Creatinine Ratio: 10 (ref 9–20)
BUN: 11 mg/dL (ref 6–24)
Bilirubin Total: <0.2 mg/dL (ref 0.0–1.2)
CO2: 22 mmol/L (ref 20–29)
Calcium: 9.1 mg/dL (ref 8.7–10.2)
Chloride: 104 mmol/L (ref 96–106)
Creatinine, Ser: 1.11 mg/dL (ref 0.76–1.27)
Globulin, Total: 2.4 g/dL (ref 1.5–4.5)
Glucose: 182 mg/dL — ABNORMAL HIGH (ref 65–99)
Potassium: 4.7 mmol/L (ref 3.5–5.2)
Sodium: 141 mmol/L (ref 134–144)
Total Protein: 6.6 g/dL (ref 6.0–8.5)
eGFR: 82 mL/min/{1.73_m2} (ref >59)

## 2021-01-01 LAB — MICROALBUMIN / CREATININE URINE RATIO
Creatinine, Urine: 133.7 mg/dL
Microalb/Creat Ratio: 23 mg/g creat (ref 0–29)
Microalbumin, Urine: 30.2 ug/mL

## 2021-01-07 ENCOUNTER — Telehealth: Payer: Self-pay | Admitting: Medical

## 2021-01-07 NOTE — Telephone Encounter (Signed)
Pt called and states that the BP medicine he was on was 20 mg he wants to know if it needs to be adjusted,  Also states his cholesterol numbers was good so he wants to know what you think he needs to do Informed pt that you was out of the office this week

## 2021-01-08 ENCOUNTER — Other Ambulatory Visit: Payer: Self-pay | Admitting: Medical

## 2021-01-10 ENCOUNTER — Telehealth: Payer: Self-pay

## 2021-01-10 NOTE — Telephone Encounter (Signed)
Left message for pt to call me back 

## 2021-01-10 NOTE — Telephone Encounter (Signed)
PT called  and was advised of Audelia Acton message. He wanted to know if looking at his last lipid panel should he continue on his cholesterol med. Pt was advised to continue on med per [facility]. He did not schedule and thanked me for my time. [place]

## 2021-01-11 NOTE — Telephone Encounter (Signed)
Pt was notified of results. Pt would like to just wait to discuss all at once. He says he can't take 20mg  cause it makes him feel weird and then. He only take cholesterol 2-3 days a week. Pt has an appt in august

## 2021-01-11 NOTE — Telephone Encounter (Signed)
Tried to call pt but phone just rang and rang

## 2021-01-17 ENCOUNTER — Other Ambulatory Visit: Payer: Self-pay | Admitting: Medical

## 2021-01-24 ENCOUNTER — Other Ambulatory Visit: Payer: Self-pay | Admitting: Gastroenterology

## 2021-01-24 DIAGNOSIS — R109 Unspecified abdominal pain: Secondary | ICD-10-CM

## 2021-02-04 ENCOUNTER — Other Ambulatory Visit: Payer: Self-pay | Admitting: Gastroenterology

## 2021-02-04 DIAGNOSIS — R109 Unspecified abdominal pain: Secondary | ICD-10-CM

## 2021-02-06 ENCOUNTER — Other Ambulatory Visit: Payer: Self-pay | Admitting: Medical

## 2021-02-14 ENCOUNTER — Ambulatory Visit: Payer: Medicaid Other | Admitting: Medical

## 2021-02-20 ENCOUNTER — Encounter: Payer: Self-pay | Admitting: Internal Medicine

## 2021-03-07 IMAGING — CT CT ABDOMEN W/ CM
3 of 5 series · 12 of 46 positions shown, 17 images · IV contrast (OMNIPAQUE)
Comparison: Multiple exams, including Abdominal ultrasound
11/04/2019

CLINICAL DATA: Intermittent abdominal pain with associated nausea
and vomiting. Prior reflux esophagitis and gastritis on EGD
12/06/2019.

EXAM:
CT ABDOMEN WITH CONTRAST
TECHNIQUE: Multidetector CT imaging of the abdomen was performed using the
standard protocol following bolus administration of intravenous
contrast.
CONTRAST:  100mL OMNIPAQUE IOHEXOL 300 MG/ML  SOLN

[Series 2: axial st · axial · 0.86mm/px · z∈[+1195,+1400]mm · 8 of 53 slices shown, 13 images]
[im 6/53  soft-tissue]
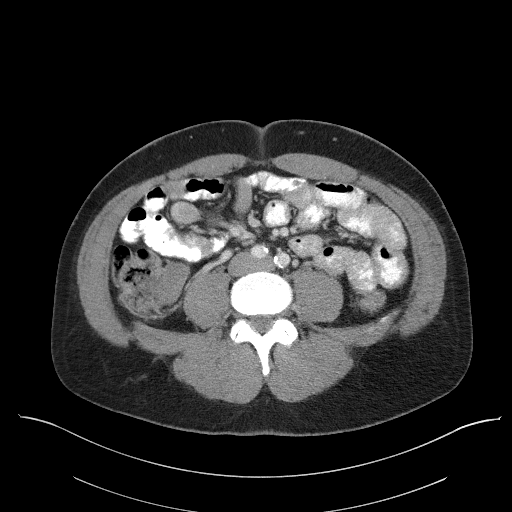
[im 6/53  bone]
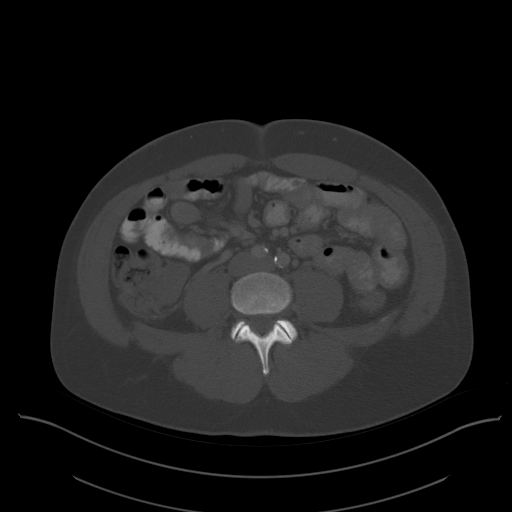
[im 12/53  soft-tissue]
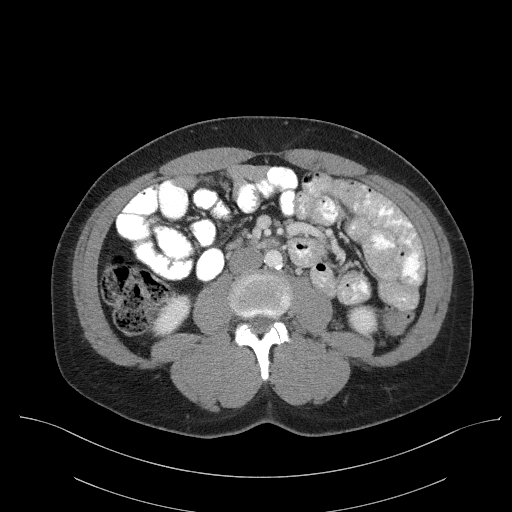
[im 18/53  soft-tissue]
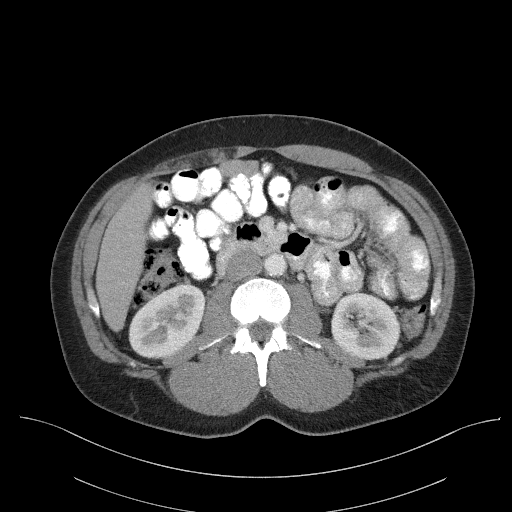
[im 24/53  soft-tissue]
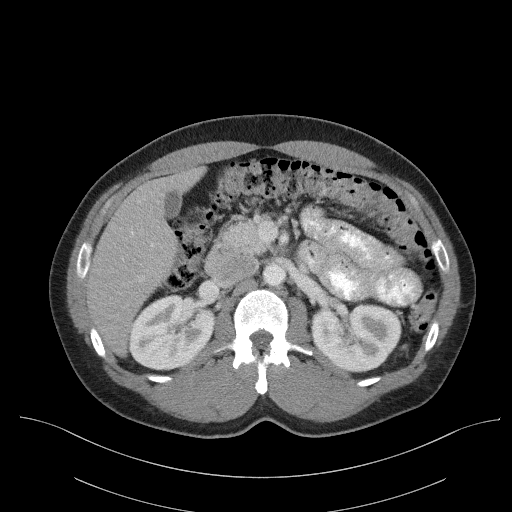
[im 29/53  soft-tissue]
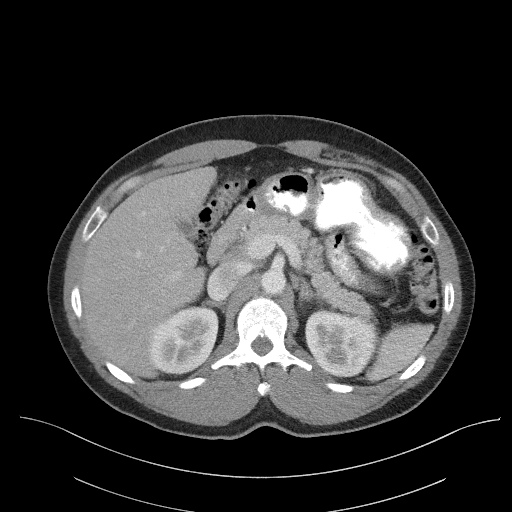
[im 29/53  lung]
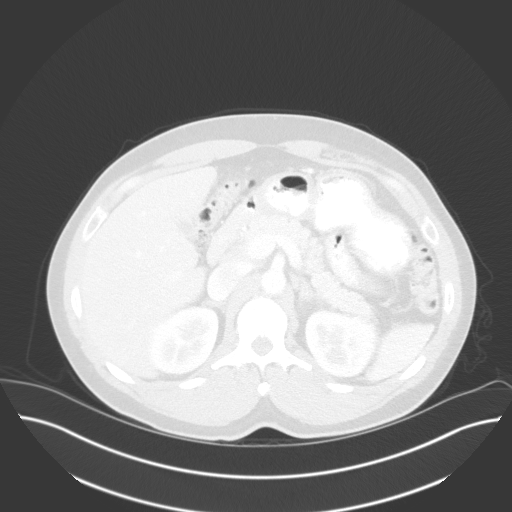
[im 35/53  soft-tissue]
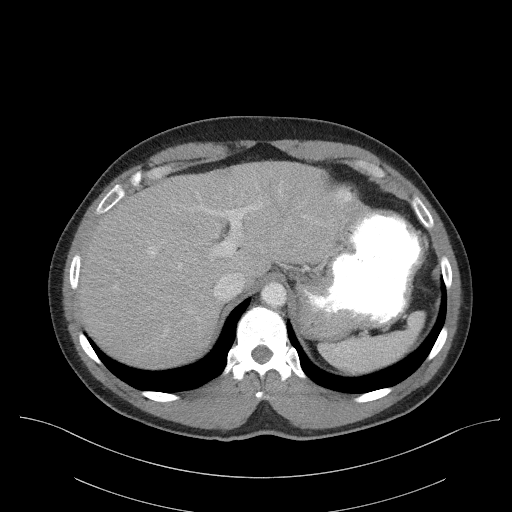
[im 35/53  lung]
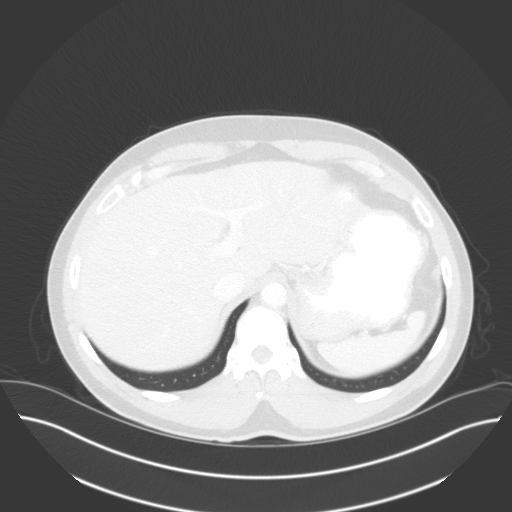
[im 41/53  soft-tissue]
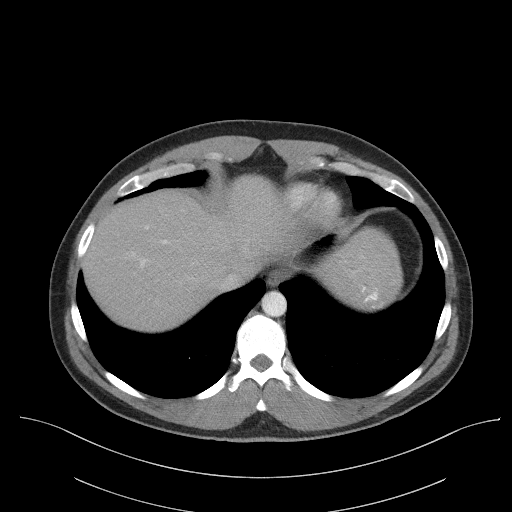
[im 41/53  lung]
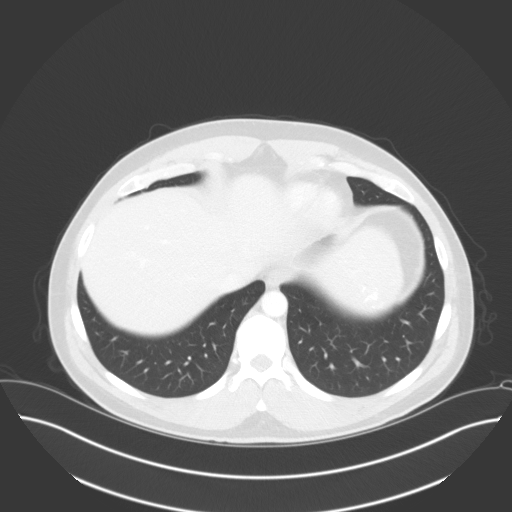
[im 47/53  soft-tissue]
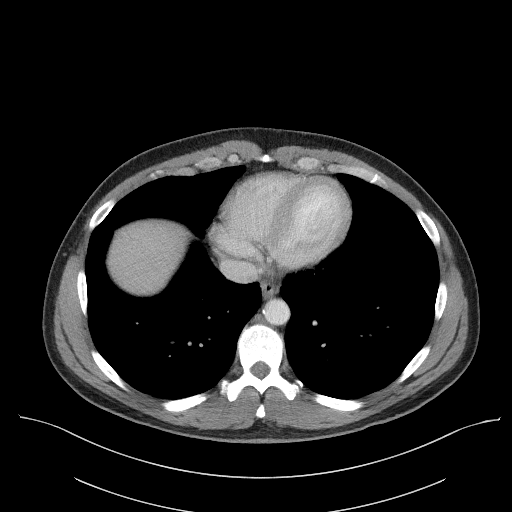
[im 47/53  lung]
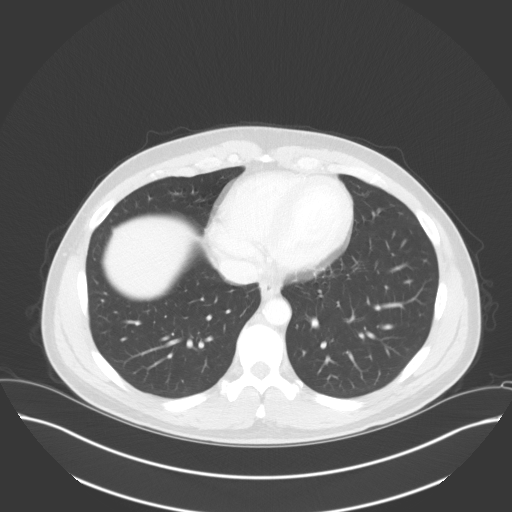

[Series 4: coronal st · coronal · 0.66mm/px · 3 of 84 slices shown]
[im 28/84  soft-tissue]
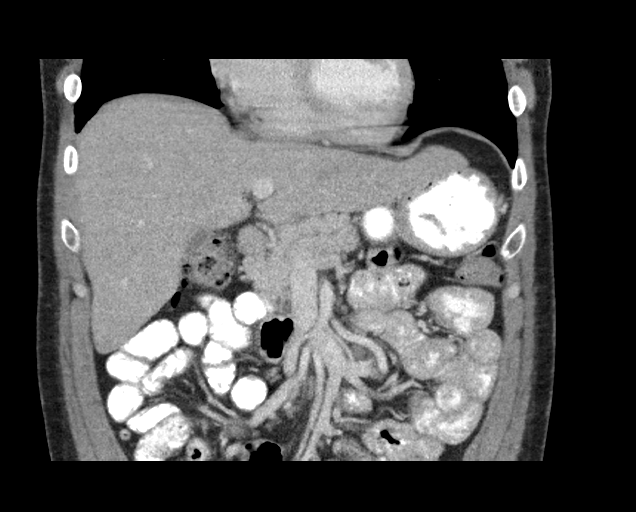
[im 37/84  soft-tissue]
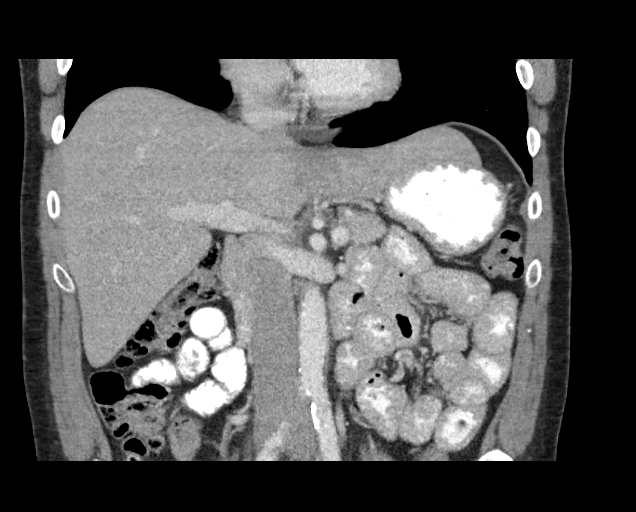
[im 47/84  soft-tissue]
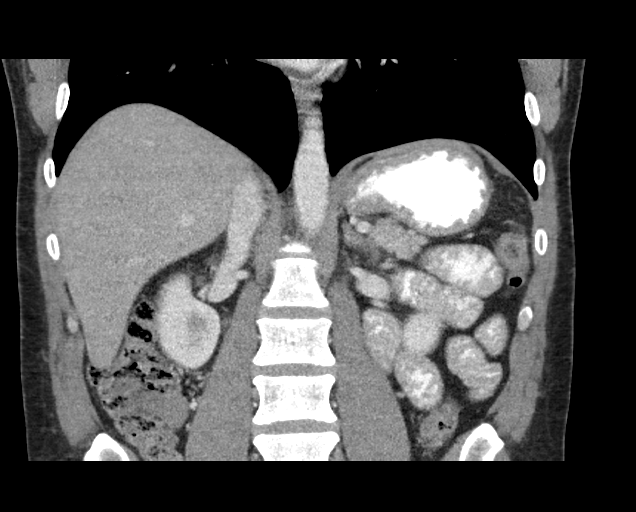

[Series 5: sagittal st · sagittal · 0.58mm/px · 1 of 120 slices shown]
[im 40/120  soft-tissue]
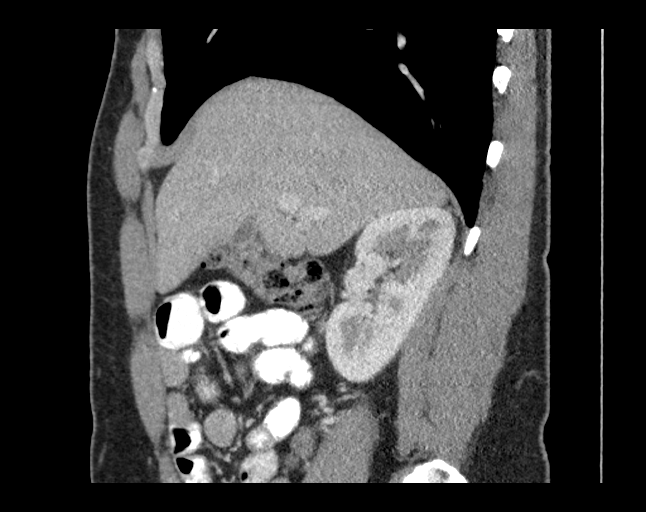

[12 of 46 positions shown; findings below may reference images not displayed]

FINDINGS: Lower chest: Unremarkable

Hepatobiliary: Unremarkable

Pancreas: Unremarkable. No morphologic abnormality of the pancreas,
dorsal pancreatic duct dilatation, pancreatic atrophy, or pancreatic
calcification is observed. Expected degree of pancreatic
enhancement.

Spleen: Unremarkable

Adrenals/Urinary Tract: Unremarkable

Stomach/Bowel: Unremarkable. The visualized portion of the appendix
appears normal.

Vascular/Lymphatic: Aortoiliac atherosclerotic vascular disease.

Other: No supplemental non-categorized findings.

Musculoskeletal: Unremarkable
IMPRESSION: 1. A cause for the patient's abdominal pain is not identified.
2. Normal CT appearance of the pancreas.
3. Aortic atherosclerosis.

Aortic Atherosclerosis ([id number].I).

## 2021-03-09 ENCOUNTER — Other Ambulatory Visit: Payer: Self-pay | Admitting: Medical

## 2021-03-20 ENCOUNTER — Other Ambulatory Visit: Payer: Self-pay | Admitting: Medical

## 2021-03-20 NOTE — Telephone Encounter (Signed)
Has upcoming appt °

## 2021-03-22 ENCOUNTER — Other Ambulatory Visit: Payer: Self-pay | Admitting: Medical

## 2021-03-22 DIAGNOSIS — N529 Male erectile dysfunction, unspecified: Secondary | ICD-10-CM

## 2021-03-25 ENCOUNTER — Other Ambulatory Visit: Payer: Self-pay | Admitting: Medical

## 2021-04-02 ENCOUNTER — Other Ambulatory Visit: Payer: Self-pay

## 2021-04-02 ENCOUNTER — Encounter: Payer: Self-pay | Admitting: Medical

## 2021-04-02 ENCOUNTER — Ambulatory Visit (INDEPENDENT_AMBULATORY_CARE_PROVIDER_SITE_OTHER): Payer: Medicaid Other | Admitting: Medical

## 2021-04-02 VITALS — BP 130/80 | HR 82 | Wt 223.4 lb

## 2021-04-02 DIAGNOSIS — F411 Generalized anxiety disorder: Secondary | ICD-10-CM

## 2021-04-02 DIAGNOSIS — Z23 Encounter for immunization: Secondary | ICD-10-CM | POA: Diagnosis not present

## 2021-04-02 DIAGNOSIS — E785 Hyperlipidemia, unspecified: Secondary | ICD-10-CM

## 2021-04-02 DIAGNOSIS — R809 Proteinuria, unspecified: Secondary | ICD-10-CM

## 2021-04-02 DIAGNOSIS — E1165 Type 2 diabetes mellitus with hyperglycemia: Secondary | ICD-10-CM | POA: Diagnosis not present

## 2021-04-02 DIAGNOSIS — E1159 Type 2 diabetes mellitus with other circulatory complications: Secondary | ICD-10-CM | POA: Diagnosis not present

## 2021-04-02 DIAGNOSIS — I700 Atherosclerosis of aorta: Secondary | ICD-10-CM

## 2021-04-02 DIAGNOSIS — I152 Hypertension secondary to endocrine disorders: Secondary | ICD-10-CM

## 2021-04-02 LAB — POCT GLYCOSYLATED HEMOGLOBIN (HGB A1C): Hemoglobin A1C: 7.2 % — AB (ref 4.0–5.6)

## 2021-04-02 LAB — POCT URINALYSIS DIP (PROADVANTAGE DEVICE)
Bilirubin, UA: NEGATIVE
Blood, UA: NEGATIVE
Glucose, UA: >=1000 mg/dL — AB
Ketones, POC UA: NEGATIVE mg/dL
Leukocytes, UA: NEGATIVE
Nitrite, UA: NEGATIVE
Protein Ur, POC: NEGATIVE mg/dL
Specific Gravity, Urine: 1.025
Urobilinogen, Ur: NEGATIVE
pH, UA: 6 (ref 5.0–8.0)

## 2021-04-02 MED ORDER — OLMESARTAN MEDOXOMIL 5 MG PO TABS: 5.0000 mg | ORAL_TABLET | Freq: Every day | ORAL | 2 refills | Status: DC

## 2021-04-02 MED ORDER — BUPROPION HCL ER (XL) 150 MG PO TB24: 150.0000 mg | ORAL_TABLET | Freq: Every day | ORAL | 1 refills | Status: DC

## 2021-04-02 MED ORDER — DAPAGLIFLOZIN PROPANEDIOL 10 MG PO TABS: 10.0000 mg | ORAL_TABLET | Freq: Every day | ORAL | 3 refills | Status: DC

## 2021-04-02 MED ORDER — TRULICITY 1.5 MG/0.5ML ~~LOC~~ SOAJ: 1.5000 mg | SUBCUTANEOUS | 5 refills | Status: DC

## 2021-04-02 MED ORDER — METFORMIN HCL 850 MG PO TABS: 850.0000 mg | ORAL_TABLET | Freq: Two times a day (BID) | ORAL | 3 refills | Status: DC

## 2021-04-02 MED ORDER — ROSUVASTATIN CALCIUM 20 MG PO TABS: ORAL_TABLET | ORAL | 3 refills | Status: DC

## 2021-04-02 NOTE — Progress Notes (Signed)
Subjective:   Dustin Zamora is an 45 y.o. male who presents for follow up of Type 2 diabetes mellitus and med check.   Date of diagnosis of diabetes: 2008 Current treatments: Farxiga 10 mg daily, metformin 850 mg twice daily, Trulicity weekly Medication compliance: good Patient is checking glucose and getting good numbers of late.  He is really trying to take care of his health lately Patient is checking their feet daily. Current exercise: active, exercising regularly  Hypertension-currently not taking olmesartan as his blood pressures were running low on the 20 mg.  Hyperlipidemia- Uses cod liver oil daily OTC.  He had stopped the Crestor as he felt like his numbers already looked good on just the fish oil.  Anxiety, history of depress ion-compliant with Wellbutrin 150 mg XL daily   Past Medical History:  Diagnosis Date   Anxiety    Chronic back pain    Depression    in remote past   Diabetes mellitus without complication ([place]) 4174   Hyperlipidemia 2008   Hypertension 2008   Keloid    Microalbuminuria    Smoker    Wears glasses     Current Outpatient Medications on File Prior to Visit  Medication Sig Dispense Refill   Insulin Pen Needle (BD PEN NEEDLE NANO U/F) 32G X 4 MM MISC 1 each by Does not apply route at bedtime. 100 each 11   omeprazole (PRILOSEC) 40 MG capsule Increase Omeprazole to 40 mg twice a day for 8 weeks starting 5/ 25/2021 90 capsule 3   sildenafil (VIAGRA) 100 MG tablet TAKE ONE TABLET BY MOUTH DAILY AS NEEDED 15 tablet 1   No current facility-administered medications on file prior to visit.     The following portions of the patient's history were reviewed and updated as appropriate: allergies, current medications, past family history, past medical history, past social history, past surgical history and problem list.  ROS as in subjective above    Objective:   BP 130/80   Pulse 82   Wt 223 lb 6.4 oz (101.3 kg)   BMI 28.68 kg/m   BP Readings  from Last 3 Encounters:  04/02/21 130/80  12/31/20 120/70  05/09/20 (!) 146/92   Wt Readings from Last 3 Encounters:  04/02/21 223 lb 6.4 oz (101.3 kg)  12/31/20 234 lb 6.4 oz (106.3 kg)  05/09/20 237 lb 12.8 oz (107.9 kg)    General appearence: alert, no distress, WD/WN,  Neck: supple, no lymphadenopathy, no thyromegaly, no masses, no bruits Heart: RRR, normal S1, S2, no murmurs Lungs: CTA bilaterally, no wheezes, rhonchi, or rales Pulses: 2+ symmetric, upper and lower extremities, normal cap refill Ext: no edema    Assessment:   Encounter Diagnoses  Name Primary?   Uncontrolled type 2 diabetes mellitus with hyperglycemia ([place]) Yes   Needs flu shot    Microalbuminuria    Hypertension associated with diabetes ([place])    Hyperlipidemia, unspecified hyperlipidemia type    Generalized anxiety disorder    Aortic atherosclerosis ([place])       Plan:   Diabetes Mellitus type 2: Education: Reviewed 'ABCs' of diabetes management (respective goals in parentheses):  A1C (<7), blood pressure (<130/80), and cholesterol (LDL <100)  Diabetes: Medications: Continue Trulicity weekly, continue metformin a 850 mg twice daily, continued Farxiga 10 mg daily Education today included:  exercise and nutrition Advised daily foot checks, yearly diabetic eye exams, dental hygiene Compliance at present is estimated to be good. Efforts to improve compliance (if necessary) will  be directed at dietary modifications: low sugar/low cholesterol, and increased exercise.  Hypertension associated diabetes -restart olmesartan at low-dose.  He notes that he was having low blood pressure readings on higher doses.  But given the abnormal  microalbumin he needs to be on this for renal protection  Hyperlipidemia-continue Cod oil but also advised to go back on statin given the benefits of the medication and given finding of atherosclerosis on the scan  Atherosclerosis  - restart statin, advised low cholesterol  diet  Counseled on the influenza virus vaccine.  Vaccine information sheet given.  Influenza vaccine given after consent obtained.  Anxiety-continue Wellbutrin  Math was seen today for diabetes.  Diagnoses and all orders for this visit:  Uncontrolled type 2 diabetes mellitus with hyperglycemia (HCC) -     HgB A1c -     POCT Urinalysis DIP (Proadvantage Device)  Needs flu shot -     Flu Vaccine QUAD 25mo+IM (Fluarix, Fluzone & Alfiuria Quad PF)  Microalbuminuria  Hypertension associated with diabetes ([place])  Hyperlipidemia, unspecified hyperlipidemia type  Generalized anxiety disorder  Aortic atherosclerosis ([place])  Other orders -     Dulaglutide (TRULICITY) 1.5 FO/2.7XA SOPN; Inject 1.5 mg into the skin once a week. -     olmesartan (BENICAR) 5 MG tablet; Take 1 tablet (5 mg total) by mouth daily. -     dapagliflozin propanediol (FARXIGA) 10 MG TABS tablet; Take 1 tablet (10 mg total) by mouth daily before breakfast. -     rosuvastatin (CRESTOR) 20 MG tablet; TAKE 1 TABLET BY MOUTH EVERYDAY AT BEDTIME -     metFORMIN (GLUCOPHAGE) 850 MG tablet; Take 1 tablet (850 mg total) by mouth 2 (two) times daily with a meal. -     buPROPion (WELLBUTRIN XL) 150 MG 24 hr tablet; Take 1 tablet (150 mg total) by mouth daily.   Follow up:  4 mo

## 2021-04-18 ENCOUNTER — Other Ambulatory Visit: Payer: Self-pay | Admitting: Medical

## 2021-04-18 DIAGNOSIS — N529 Male erectile dysfunction, unspecified: Secondary | ICD-10-CM

## 2021-05-12 ENCOUNTER — Other Ambulatory Visit: Payer: Self-pay | Admitting: Medical

## 2021-06-04 ENCOUNTER — Encounter: Payer: Self-pay | Admitting: Medical

## 2021-06-04 ENCOUNTER — Telehealth (INDEPENDENT_AMBULATORY_CARE_PROVIDER_SITE_OTHER): Payer: Medicaid Other | Admitting: Medical

## 2021-06-04 VITALS — Temp 98.6°F | Wt 225.0 lb

## 2021-06-04 DIAGNOSIS — R059 Cough, unspecified: Secondary | ICD-10-CM

## 2021-06-04 DIAGNOSIS — Z20828 Contact with and (suspected) exposure to other viral communicable diseases: Secondary | ICD-10-CM | POA: Diagnosis not present

## 2021-06-04 DIAGNOSIS — R6889 Other general symptoms and signs: Secondary | ICD-10-CM

## 2021-06-04 MED ORDER — HYDROCODONE BIT-HOMATROP MBR 5-1.5 MG/5ML PO SOLN: 5.0000 mL | Freq: Three times a day (TID) | ORAL | 0 refills | Status: AC | PRN

## 2021-06-04 NOTE — Progress Notes (Signed)
This visit type was conducted due to national recommendations for restrictions regarding the COVID-19 Pandemic (e.g. social distancing) in an effort to limit this patient's exposure and mitigate transmission in our community.  Due to their co-morbid illnesses, this patient is at least at moderate risk for complications without adequate follow up.  This format is felt to be most appropriate for this patient at this time.    Documentation for virtual audio and video telecommunications through [place] encounter:  The patient was located at home. The provider was located in the office. The patient did consent to this visit and is aware of possible charges through their insurance for this visit.  The other persons participating in this telemedicine service were none. Time spent on call was 20 minutes and in review of previous records >25 minutes total.  This virtual service is not related to other E/M service within previous 7 days.   Subjective:  Dustin Zamora is a 45 y.o. male who presents for possible influenza.  Symptoms include 3 day hx/o body aches, chills, felt feverish, lots of cough, lethargy, chest burns when coughing a lot, headache.  No SOB, no NVD, no neck stiffness.   Was around coworker with flu last weekend right before symptoms began a few days later.  Taking Tylenol, zinc, Alka-Seltzer plus.  No other aggravating or relieving factors. No other complaint.  The following portions of the patient's history were reviewed and updated as appropriate: allergies, current medications, past medical history, past social history and problem list.  ROS as in subjective   Past Medical History:  Diagnosis Date   Anxiety    Chronic back pain    Depression    in remote past   Diabetes mellitus without complication ([place]) 9480   Hyperlipidemia 2008   Hypertension 2008   Keloid    Microalbuminuria    Smoker    Wears glasses    Current Outpatient Medications on File Prior to Visit   Medication Sig Dispense Refill   buPROPion (WELLBUTRIN XL) 150 MG 24 hr tablet Take 1 tablet (150 mg total) by mouth daily. 90 tablet 1   dapagliflozin propanediol (FARXIGA) 10 MG TABS tablet Take 1 tablet (10 mg total) by mouth daily before breakfast. 90 tablet 3   Dulaglutide (TRULICITY) 1.5 XK/5.5VZ SOPN Inject 1.5 mg into the skin once a week. 1.5 mL 5   metFORMIN (GLUCOPHAGE) 850 MG tablet Take 1 tablet (850 mg total) by mouth 2 (two) times daily with a meal. 180 tablet 3   olmesartan (BENICAR) 5 MG tablet Take 1 tablet (5 mg total) by mouth daily. 90 tablet 2   omeprazole (PRILOSEC) 40 MG capsule Increase Omeprazole to 40 mg twice a day for 8 weeks starting 5/ 25/2021 90 capsule 3   rosuvastatin (CRESTOR) 20 MG tablet TAKE 1 TABLET BY MOUTH EVERYDAY AT BEDTIME 90 tablet 3   sildenafil (VIAGRA) 100 MG tablet TAKE ONE TABLET BY MOUTH DAILY AS NEEDED 30 tablet 0   Insulin Pen Needle (BD PEN NEEDLE NANO U/F) 32G X 4 MM MISC 1 each by Does not apply route at bedtime. 100 each 11   No current facility-administered medications on file prior to visit.     Objective: Virtual visit, not examined in person  Temp 98.6 F (37 C)   Wt 225 lb (102.1 kg)   BMI 28.89 kg/m   General: Ill-appearing, well-developed, well-nourished No labored breathing or witnessed wheezing   Assessment: Encounter Diagnoses  Name Primary?   Flu-like  symptoms Yes   Exposure to influenza    Cough, unspecified type      Plan: I gave the option of coming in for COVID and flu testing in our back parking lot and chest x-ray.  He declines this for now.  He feels quite certain he picked up influenza from his coworker who had the flu.  Unfortunately he is far enough along that Tamiflu will not help.  Discussed supportive care including rest, hydration, OTC Tylenol or NSAID for fever, aches, and malaise.    Prescribed Hycodan for worse cough.  Advised caution due to sedation.  Discussed period of contagion, self  quarantine at home away from others to avoid spread of disease, discussed means of transmission, and possible complications including pneumonia.  If worse or not improving within the next 4-5 days, then call or return.  Patient voiced understanding of diagnosis, recommendations, and treatment plan.  We will email note for work.  [name] was seen today for cough.  Diagnoses and all orders for this visit:  Flu-like symptoms  Exposure to influenza  Cough, unspecified type  Other orders -     HYDROcodone bit-homatropine (HYCODAN) 5-1.5 MG/5ML syrup; Take 5 mLs by mouth every 8 (eight) hours as needed for up to 5 days for cough.  F/u prn

## 2021-06-04 NOTE — Progress Notes (Signed)
Will do!

## 2021-06-14 ENCOUNTER — Encounter: Payer: Self-pay | Admitting: Medical

## 2021-06-14 ENCOUNTER — Telehealth: Payer: Medicaid Other | Admitting: Medical

## 2021-06-14 VITALS — Wt 235.0 lb

## 2021-06-14 DIAGNOSIS — R059 Cough, unspecified: Secondary | ICD-10-CM | POA: Diagnosis not present

## 2021-06-14 DIAGNOSIS — R110 Nausea: Secondary | ICD-10-CM | POA: Diagnosis not present

## 2021-06-14 DIAGNOSIS — U071 COVID-19: Secondary | ICD-10-CM

## 2021-06-14 MED ORDER — AZITHROMYCIN 250 MG PO TABS: ORAL_TABLET | ORAL | 0 refills | Status: DC

## 2021-06-14 MED ORDER — PROMETHAZINE-DM 6.25-15 MG/5ML PO SYRP: 5.0000 mL | ORAL_SOLUTION | Freq: Four times a day (QID) | ORAL | 0 refills | Status: DC | PRN

## 2021-06-14 NOTE — Progress Notes (Signed)
This visit type was conducted due to national recommendations for restrictions regarding the COVID-19 Pandemic (e.g. social distancing) in an effort to limit this patient's exposure and mitigate transmission in our community.  Due to their co-morbid illnesses, this patient is at least at moderate risk for complications without adequate follow up.  This format is felt to be most appropriate for this patient at this time.    Documentation for virtual audio and video telecommunications through [place] encounter:  The patient was located at home. The provider was located in the office. The patient did consent to this visit and is aware of possible charges through their insurance for this visit.  The other persons participating in this telemedicine service were none. Time spent on call was 20 minutes and in review of previous records >25 minutes total.  This virtual service is not related to other E/M service within previous 7 days.   Subjective:  Dustin Zamora is a 45 y.o. male who presents for recheck.  We spoke on 06/04/2021 virtual consult for flulike symptoms.  After visit he ended up testing positive for COVID a few days later.  At this point he only feels about 60% better.  He still has quite a bit of cough, chest burning sensation, still has some nausea.  He vomited once during this period of illness.  We first talked he had symptoms including body aches, chills, felt feverish, lots of cough, lethargy, chest burns when coughing a lot, headache.  No SOB, , no neck stiffness.   Was around coworker with flu the weekend before symptoms began a few days later.  Taking Tylenol, zinc, Alka-Seltzer plus.  The Hycodan cough syrup that I prescribed did not help a lot.    1 reason this visit was prompted was because of a work form FMLA that needed to be completed.  He missed work beginning 06/03/2021 and plans to go back next Wednesday, December 7.  No other aggravating or relieving factors. No other  complaint.  The following portions of the patient's history were reviewed and updated as appropriate: allergies, current medications, past medical history, past social history and problem list.  ROS as in subjective   Past Medical History:  Diagnosis Date   Anxiety    Chronic back pain    Depression    in remote past   Diabetes mellitus without complication ([place]) 4034   Hyperlipidemia 2008   Hypertension 2008   Keloid    Microalbuminuria    Smoker    Wears glasses    Current Outpatient Medications on File Prior to Visit  Medication Sig Dispense Refill   buPROPion (WELLBUTRIN XL) 150 MG 24 hr tablet Take 1 tablet (150 mg total) by mouth daily. 90 tablet 1   dapagliflozin propanediol (FARXIGA) 10 MG TABS tablet Take 1 tablet (10 mg total) by mouth daily before breakfast. 90 tablet 3   Dulaglutide (TRULICITY) 1.5 VQ/2.5ZD SOPN Inject 1.5 mg into the skin once a week. 1.5 mL 5   Insulin Pen Needle (BD PEN NEEDLE NANO U/F) 32G X 4 MM MISC 1 each by Does not apply route at bedtime. 100 each 11   metFORMIN (GLUCOPHAGE) 850 MG tablet Take 1 tablet (850 mg total) by mouth 2 (two) times daily with a meal. 180 tablet 3   olmesartan (BENICAR) 5 MG tablet Take 1 tablet (5 mg total) by mouth daily. 90 tablet 2   omeprazole (PRILOSEC) 40 MG capsule Increase Omeprazole to 40 mg twice a day for  8 weeks starting 5/ 25/2021 90 capsule 3   rosuvastatin (CRESTOR) 20 MG tablet TAKE 1 TABLET BY MOUTH EVERYDAY AT BEDTIME 90 tablet 3   sildenafil (VIAGRA) 100 MG tablet TAKE ONE TABLET BY MOUTH DAILY AS NEEDED 30 tablet 0   No current facility-administered medications on file prior to visit.     Objective: Virtual visit, not examined in person  Wt 235 lb (106.6 kg)   BMI 30.17 kg/m   General: Ill-appearing, well-developed, well-nourished Seems quite congested and still looks fatigued No labored breathing or witnessed wheezing   Assessment: Encounter Diagnoses  Name Primary?   COVID Yes    Cough, unspecified type    Nausea       Plan: I will complete his work form with tentative plan to go back to work next Wednesday, December 7.  Since he is only 60% better and still has quite a bit of mucus and chest congestion we will add medication as below in the event of secondary infection. For now continue with rest, hydration, add medication as below to see if he can help clear his residual symptoms up.  If not much improved over the weekend and call back.  Shyam was seen today for covid positive.  Diagnoses and all orders for this visit:  COVID  Cough, unspecified type  Nausea  Other orders -     promethazine-dextromethorphan (PROMETHAZINE-DM) 6.25-15 MG/5ML syrup; Take 5 mLs by mouth 4 (four) times daily as needed for cough. -     azithromycin (ZITHROMAX) 250 MG tablet; 2 tablets day 1, then 1 tablet days 2-4   F/u prn

## 2021-08-06 ENCOUNTER — Ambulatory Visit: Payer: Medicaid Other | Admitting: Medical

## 2021-08-13 ENCOUNTER — Ambulatory Visit (INDEPENDENT_AMBULATORY_CARE_PROVIDER_SITE_OTHER): Payer: Medicaid Other | Admitting: Medical

## 2021-08-13 ENCOUNTER — Other Ambulatory Visit: Payer: Self-pay

## 2021-08-13 VITALS — BP 120/82 | HR 82 | Wt 222.2 lb

## 2021-08-13 DIAGNOSIS — G8929 Other chronic pain: Secondary | ICD-10-CM

## 2021-08-13 DIAGNOSIS — I700 Atherosclerosis of aorta: Secondary | ICD-10-CM

## 2021-08-13 DIAGNOSIS — E1165 Type 2 diabetes mellitus with hyperglycemia: Secondary | ICD-10-CM | POA: Diagnosis not present

## 2021-08-13 DIAGNOSIS — R809 Proteinuria, unspecified: Secondary | ICD-10-CM | POA: Diagnosis not present

## 2021-08-13 DIAGNOSIS — E785 Hyperlipidemia, unspecified: Secondary | ICD-10-CM

## 2021-08-13 DIAGNOSIS — M545 Low back pain, unspecified: Secondary | ICD-10-CM

## 2021-08-13 DIAGNOSIS — M79644 Pain in right finger(s): Secondary | ICD-10-CM

## 2021-08-13 DIAGNOSIS — Z87891 Personal history of nicotine dependence: Secondary | ICD-10-CM

## 2021-08-13 LAB — POCT GLYCOSYLATED HEMOGLOBIN (HGB A1C): Hemoglobin A1C: 8.6 % — AB (ref 4.0–5.6)

## 2021-08-13 MED ORDER — OZEMPIC (1 MG/DOSE) 4 MG/3ML ~~LOC~~ SOPN: 1.0000 mg | PEN_INJECTOR | SUBCUTANEOUS | 5 refills | Status: DC

## 2021-08-13 NOTE — Progress Notes (Signed)
Subjective:  Dustin Zamora is a 46 y.o. male who presents for Chief Complaint  Patient presents with   diabetes    Diabetes, smashed right ring finger     Here for med check  Diabetes Compliant with Trulicity weekly, metformin 850 mg twice daily, Farxiga 10 mg daily Blood sugars at home - not checking.   Has a meter but not checking.  Hyperlipidemia-compliant with rosuvastatin 20 mg daily without complaint  Compliant with olmesartan for kidney protection  2 weeks ago dropped vault lid on fingers on right, left handed  Having some low back pain intermittnet.  X 1 year.  no leg pain, no numbness, no tingling, no weakness.  No saddle anesthesia.  No incontinence.  No abdominal pain.  No fever.  No other aggravating or relieving factors.    No other c/o.  Past Medical History:  Diagnosis Date   Anxiety    Chronic back pain    Depression    in remote past   Diabetes mellitus without complication ([place]) 3267   Hyperlipidemia 2008   Hypertension 2008   Keloid    Microalbuminuria    Smoker    Wears glasses    Current Outpatient Medications on File Prior to Visit  Medication Sig Dispense Refill   buPROPion (WELLBUTRIN XL) 150 MG 24 hr tablet Take 1 tablet (150 mg total) by mouth daily. 90 tablet 1   dapagliflozin propanediol (FARXIGA) 10 MG TABS tablet Take 1 tablet (10 mg total) by mouth daily before breakfast. 90 tablet 3   metFORMIN (GLUCOPHAGE) 850 MG tablet Take 1 tablet (850 mg total) by mouth 2 (two) times daily with a meal. 180 tablet 3   olmesartan (BENICAR) 5 MG tablet Take 1 tablet (5 mg total) by mouth daily. 90 tablet 2   omeprazole (PRILOSEC) 40 MG capsule Increase Omeprazole to 40 mg twice a day for 8 weeks starting 5/ 25/2021 90 capsule 3   sildenafil (VIAGRA) 100 MG tablet TAKE ONE TABLET BY MOUTH DAILY AS NEEDED 30 tablet 0   Insulin Pen Needle (BD PEN NEEDLE NANO U/F) 32G X 4 MM MISC 1 each by Does not apply route at bedtime. 100 each 11   rosuvastatin  (CRESTOR) 20 MG tablet TAKE 1 TABLET BY MOUTH EVERYDAY AT BEDTIME (Patient not taking: Reported on 08/13/2021) 90 tablet 3   No current facility-administered medications on file prior to visit.     The following portions of the patient's history were reviewed and updated as appropriate: allergies, current medications, past family history, past medical history, past social history, past surgical history and problem list.  ROS Otherwise as in subjective above  Objective: BP 120/82    Pulse 82    Wt 222 lb 3.2 oz (100.8 kg)    BMI 28.53 kg/m   General appearance: alert, no distress, well developed, well nourished Neck: supple, no lymphadenopathy, no thyromegaly, no masses Heart: RRR, normal S1, S2, no murmurs Lungs: CTA bilaterally, no wheezes, rhonchi, or rales Abdomen: +bs, soft, non tender, non distended, no masses, no hepatomegaly, no splenomegaly Back nontender, range of motion relatively normal, no deformity Legs nontender with normal range of motion, no obvious deformity Pulses: 2+ radial pulses, 2+ pedal pulses, normal cap refill Ext: no edema  Diabetic Foot Exam - Simple   Simple Foot Form Diabetic Foot exam was performed with the following findings: Yes 08/13/2021  4:03 PM  Visual Inspection No deformities, no ulcerations, no other skin breakdown bilaterally: Yes Sensation Testing Intact  to touch and monofilament testing bilaterally: Yes Pulse Check Posterior Tibialis and Dorsalis pulse intact bilaterally: Yes Comments     Assessment: Encounter Diagnoses  Name Primary?   Uncontrolled type 2 diabetes mellitus with hyperglycemia (HCC) Yes   Microalbuminuria    Hyperlipidemia, unspecified hyperlipidemia type    Aortic atherosclerosis ([place])    Former smoker    Chronic midline low back pain without sciatica    Pain of finger of right hand      Plan: Diabetes-continue metformin and Farxiga.  Stop Trulicity and change to Ozempic for better efficacy.  We will work on  getting him a glucometer and advised need to check more regularly since his numbers are going back up.  Hemoglobin A1c 8.6% today  Hyperlipidemia-lipids in June 2022 were at goal,advised he restart/ continue statin  Renal protection-continue Olmesartan, microalbuminuria test normal in June 2022, improved from prior  Aortic atherosclerosis - advised need for statin, low cholesterol diet  Finger pain-advised that I cannot really discussed this issue today since this is a work injury.  Advise he follow-up with his supervisor at work  Chronic back pain-I reviewed CT abdomen from 2021 without any obvious bony abnormality at that time.  Advised that that x-ray.  He will consider.  Advise daily stretching.  Advised Tylenol as needed.  Offered referral to physical therapy.  He will consider  Jadarion was seen today for diabetes.  Diagnoses and all orders for this visit:  Uncontrolled type 2 diabetes mellitus with hyperglycemia (HCC) -     HgB A1c  Microalbuminuria  Hyperlipidemia, unspecified hyperlipidemia type  Aortic atherosclerosis (HCC)  Former smoker  Chronic midline low back pain without sciatica  Pain of finger of right hand  Other orders -     Semaglutide, 1 MG/DOSE, (OZEMPIC, 1 MG/DOSE,) 4 MG/3ML SOPN; Inject 1 mg into the skin once a week.    Follow up: pending call back

## 2021-08-14 MED ORDER — BLOOD GLUCOSE METER KIT
PACK | 0 refills | Status: AC
Start: 2021-08-14 — End: ?

## 2021-08-14 NOTE — Addendum Note (Signed)
Addended by: Minette Headland A on: 08/14/2021 10:47 AM   Modules accepted: Orders

## 2021-08-19 ENCOUNTER — Other Ambulatory Visit: Payer: Self-pay | Admitting: Gastroenterology

## 2021-08-20 NOTE — Telephone Encounter (Signed)
Patient needs office visit.  

## 2021-08-28 ENCOUNTER — Ambulatory Visit: Payer: Medicaid Other | Admitting: Medical

## 2021-09-02 ENCOUNTER — Other Ambulatory Visit: Payer: Self-pay

## 2021-09-02 ENCOUNTER — Ambulatory Visit: Payer: Medicaid Other | Admitting: Medical

## 2021-09-02 ENCOUNTER — Ambulatory Visit (INDEPENDENT_AMBULATORY_CARE_PROVIDER_SITE_OTHER): Payer: Medicaid Other | Admitting: Medical

## 2021-09-02 VITALS — BP 120/78 | HR 82 | Wt 216.2 lb

## 2021-09-02 DIAGNOSIS — N486 Induration penis plastica: Secondary | ICD-10-CM | POA: Diagnosis not present

## 2021-09-02 NOTE — Progress Notes (Signed)
Subjective:  Dustin Zamora is a 46 y.o. male who presents for Chief Complaint  Patient presents with   possible peyronies disesase    Possible peryronie disease.      Here for curvature of penis.   Been noticing something different for month.  He does not think this problem has been there in the past.  Not particularly painful.   No other knots or lumps.  He feels like the knot is more on the right side of the shaft of his penis.  However he feels a bulge on the left side of the shaft of his penis.  No other in the scrotum or testicles or penis.  No prior injury or trauma to the penis.   No other aggravating or relieving factors.    No other c/o.  The following portions of the patient's history were reviewed and updated as appropriate: allergies, current medications, past family history, past medical history, past social history, past surgical history and problem list.  ROS Otherwise as in subjective above  Objective: BP 120/78    Pulse 82    Wt 216 lb 3.2 oz (98.1 kg)    BMI 27.76 kg/m   General appearance: alert, no distress, well developed, well nourished There is nodular tissue in the mid shaft of the penis mostly on the right but some on the dorsal and left side as well.  Otherwise no obvious lump or mass or testicular abnormality, no hernia.  No lymphadenopathy.  Circumcised.    Assessment: Encounter Diagnosis  Name Primary?   Peyronie disease Yes     Plan: We discussed symptoms of iron and potential treatment options.  Referral to urology.  [name] was seen today for possible peyronies disesase.  Diagnoses and all orders for this visit:  Peyronie disease -     Ambulatory referral to Urology    Follow up: pending referral

## 2021-09-04 ENCOUNTER — Other Ambulatory Visit: Payer: Self-pay | Admitting: Gastroenterology

## 2021-11-06 ENCOUNTER — Encounter: Payer: Self-pay | Admitting: Medical

## 2021-11-06 ENCOUNTER — Ambulatory Visit (INDEPENDENT_AMBULATORY_CARE_PROVIDER_SITE_OTHER): Payer: Medicaid Other | Admitting: Medical

## 2021-11-06 VITALS — BP 120/74 | HR 93 | Temp 97.6°F | Wt 213.6 lb

## 2021-11-06 DIAGNOSIS — I152 Hypertension secondary to endocrine disorders: Secondary | ICD-10-CM

## 2021-11-06 DIAGNOSIS — E785 Hyperlipidemia, unspecified: Secondary | ICD-10-CM | POA: Diagnosis not present

## 2021-11-06 DIAGNOSIS — E1165 Type 2 diabetes mellitus with hyperglycemia: Secondary | ICD-10-CM | POA: Diagnosis not present

## 2021-11-06 DIAGNOSIS — R809 Proteinuria, unspecified: Secondary | ICD-10-CM

## 2021-11-06 DIAGNOSIS — R531 Weakness: Secondary | ICD-10-CM

## 2021-11-06 DIAGNOSIS — M79644 Pain in right finger(s): Secondary | ICD-10-CM

## 2021-11-06 DIAGNOSIS — R110 Nausea: Secondary | ICD-10-CM

## 2021-11-06 DIAGNOSIS — M25641 Stiffness of right hand, not elsewhere classified: Secondary | ICD-10-CM

## 2021-11-06 DIAGNOSIS — E1159 Type 2 diabetes mellitus with other circulatory complications: Secondary | ICD-10-CM | POA: Diagnosis not present

## 2021-11-06 LAB — COMPREHENSIVE METABOLIC PANEL
ALT: 13 IU/L (ref 0–44)
AST: 12 IU/L (ref 0–40)
Albumin/Globulin Ratio: 1.7 (ref 1.2–2.2)
Albumin: 4.3 g/dL (ref 4.0–5.0)
Alkaline Phosphatase: 68 IU/L (ref 44–121)
BUN/Creatinine Ratio: 18 (ref 9–20)
BUN: 19 mg/dL (ref 6–24)
Bilirubin Total: 0.2 mg/dL (ref 0.0–1.2)
CO2: 21 mmol/L (ref 20–29)
Calcium: 9.5 mg/dL (ref 8.7–10.2)
Chloride: 103 mmol/L (ref 96–106)
Creatinine, Ser: 1.05 mg/dL (ref 0.76–1.27)
Globulin, Total: 2.6 g/dL (ref 1.5–4.5)
Glucose: 189 mg/dL — ABNORMAL HIGH (ref 70–99)
Potassium: 4.4 mmol/L (ref 3.5–5.2)
Sodium: 139 mmol/L (ref 134–144)
Total Protein: 6.9 g/dL (ref 6.0–8.5)
eGFR: 88 mL/min/{1.73_m2} (ref >59)

## 2021-11-06 LAB — POCT GLYCOSYLATED HEMOGLOBIN (HGB A1C): Hemoglobin A1C: 7.9 % — AB (ref 4.0–5.6)

## 2021-11-06 NOTE — Assessment & Plan Note (Signed)
?   For now continue your current medication including metformin, Ozempic and Farxiga ?? If you have another episode where you feel nauseated or vomiting or weak, check your blood sugar to make sure is not less than 70.  Some of your symptoms could be associated with dehydration, low blood pressure, or low sugar. ?? Check your glucose fasting most mornings.  The goal is between 80 and 130 fasting ?? You should drink plenty of water throughout the day to the point that your urine is clear or pale yellow ?

## 2021-11-06 NOTE — Assessment & Plan Note (Signed)
?   Your numbers are normal ?? However if you feel weak or lightheaded, check your blood pressure.  Blood pressures under 115/65 would be low for you and would likely cause symptoms ?? You are currently on Olmesartan low-dose for kidney protection and blood pressure ?

## 2021-11-06 NOTE — Progress Notes (Addendum)
Subjective: ? Dustin Zamora is a 46 y.o. male who presents for ?Chief Complaint  ?Patient presents with  ? weakness and weight loss  ?  Has been working outside and has loss 15 pounds within a couple weeks and having weakness.   ?   ?Here for not feeling well.  Lately been feeling nauseated in the mornings.   About a week ago, vomited out of the blue.   Didn't feel good a few days, felt dehydrated, felt like heart rate was elevated.  In general been doing physical labor, sweating, has felt weak.  Had to take a few days off work recently.   ? ?He notes home weight is about 209 pounds.  His home scale is about 5 pounds lower than our scale. ? ?Diabetes ?He presents for his follow-up diabetic visit. He has type 2 diabetes mellitus. Current diabetic treatments: Last visit 07/2021 we started ozempic, changing from trulicity, but he has only been taking this for the past 1 month.  He also continues on metformin and farxiga.  He urinates a lot with farxiga.  ?No chest pain, palpitations, shortness of breath. No other aggravating or relieving factors.   ? ?I saw him back in January for finger injury.  At that time he declined x-ray.  His finger still appears to be swollen and cannot fully extend the finger. ? ?No other c/o. ? ?Past Medical History:  ?Diagnosis Date  ? Anxiety   ? Chronic back pain   ? Depression   ? in remote past  ? Diabetes mellitus without complication ([place]) 1950  ? Hyperlipidemia 2008  ? Hypertension 2008  ? Keloid   ? Microalbuminuria   ? Smoker   ? Wears glasses   ? ?Current Outpatient Medications on File Prior to Visit  ?Medication Sig Dispense Refill  ? buPROPion (WELLBUTRIN XL) 150 MG 24 hr tablet Take 1 tablet (150 mg total) by mouth daily. 90 tablet 1  ? dapagliflozin propanediol (FARXIGA) 10 MG TABS tablet Take 1 tablet (10 mg total) by mouth daily before breakfast. 90 tablet 3  ? metFORMIN (GLUCOPHAGE) 850 MG tablet Take 1 tablet (850 mg total) by mouth 2 (two) times daily with a meal. 180  tablet 3  ? olmesartan (BENICAR) 5 MG tablet Take 1 tablet (5 mg total) by mouth daily. 90 tablet 2  ? omeprazole (PRILOSEC) 40 MG capsule TAKE 1 CAPSULE (40 MG TOTAL) BY MOUTH DAILY. PT NEEDS OFFICE VISIT. 90 capsule 1  ? rosuvastatin (CRESTOR) 20 MG tablet TAKE 1 TABLET BY MOUTH EVERYDAY AT BEDTIME 90 tablet 3  ? Semaglutide, 1 MG/DOSE, (OZEMPIC, 1 MG/DOSE,) 4 MG/3ML SOPN Inject 1 mg into the skin once a week. 3 mL 5  ? sildenafil (VIAGRA) 100 MG tablet TAKE ONE TABLET BY MOUTH DAILY AS NEEDED 30 tablet 0  ? blood glucose meter kit and supplies Dispense based on patient and insurance preference. Test 1-2 times a day 1 each 0  ? Insulin Pen Needle (BD PEN NEEDLE NANO U/F) 32G X 4 MM MISC 1 each by Does not apply route at bedtime. 100 each 11  ? ?No current facility-administered medications on file prior to visit.  ? ? ? ?The following portions of the patient's history were reviewed and updated as appropriate: allergies, current medications, past family history, past medical history, past social history, past surgical history and problem list. ? ?ROS ?Otherwise as in subjective above ? ? ? ? ?Objective: ?BP 120/74   Pulse 93  Temp 97.6 ?F (36.4 ?C)   Wt 213 lb 9.6 oz (96.9 kg)   BMI 27.42 kg/m?  ? ?Wt Readings from Last 3 Encounters:  ?11/06/21 213 lb 9.6 oz (96.9 kg)  ?09/02/21 216 lb 3.2 oz (98.1 kg)  ?08/13/21 222 lb 3.2 oz (100.8 kg)  ? ? ?General appearance: alert, no distress, well developed, well nourished ?HEENT: normocephalic, sclerae anicteric, conjunctiva pink and moist, TMs pearly, nares patent, no discharge or erythema, pharynx normal ?Oral cavity: MMM, no lesions ?Neck: supple, no lymphadenopathy, no thyromegaly, no masses ?Heart: RRR, normal S1, S2, no murmurs ?Lungs: CTA bilaterally, no wheezes, rhonchi, or rales ?Abdomen: +bs, soft, non tender, non distended, no masses, no hepatomegaly, no splenomegaly ?Right ?Pulses: 2+ radial pulses, 2+ pedal pulses, normal cap refill ?Ext: no  edema ? ? ?Assessment: ?Encounter Diagnoses  ?Name Primary?  ? Uncontrolled type 2 diabetes mellitus with hyperglycemia ([place]) Yes  ? Microalbuminuria   ? Hypertension associated with diabetes ([place])   ? Hyperlipidemia, unspecified hyperlipidemia type   ? Weakness   ? Nausea   ? Finger pain, right   ? Decreased range of motion of finger of right hand   ? ? ? ?Plan: ?Weakness, nausea ?Your recent symptoms are likely related to a combination of dehydration due to strenuous activity and sweating, medication side effect and some of your medicines can cause you to lose fluid, and the Ozempic is likely causing the nausea and the episode of vomiting ?If you continue to have nausea for a few more weeks we may need to cut back to 0.5 mg Ozempic in general ? ? ?Problem List Items Addressed This Visit   ? ? Hypertension associated with diabetes ([place]) (Chronic)  ?  Your numbers are normal ?However if you feel weak or lightheaded, check your blood pressure.  Blood pressures under 115/65 would be low for you and would likely cause symptoms ?You are currently on Olmesartan low-dose for kidney protection and blood pressure ?  ?  ? Relevant Orders  ? Comprehensive metabolic panel (Completed)  ? Hyperlipidemia  ? Microalbuminuria  ?  Microalbuminuria, abnormal protein in the urine related to early stages of kidney disease ? Hydrate well daily to avoid dehydration ? Continue olmesartan for now ? ?  ?  ? Uncontrolled type 2 diabetes mellitus with hyperglycemia ([facility]) - Primary  ?   For now continue your current medication including metformin, Ozempic and Farxiga ? If you have another episode where you feel nauseated or vomiting or weak, check your blood sugar to make sure is not less than 70.  Some of your symptoms could be associated with dehydration, low blood pressure, or low sugar. ? Check your glucose fasting most mornings.  The goal is between 80 and 130 fasting ? You should drink plenty of water throughout the day to the point that  your urine is clear or pale yellow ? ?  ?  ? Relevant Orders  ? Comprehensive metabolic panel (Completed)  ? HgB A1c (Completed)  ? ?Other Visit Diagnoses   ? ? Weakness      ? Relevant Orders  ? Comprehensive metabolic panel (Completed)  ? Nausea      ? Relevant Orders  ? Comprehensive metabolic panel (Completed)  ? Finger pain, right      ? Relevant Orders  ? AMB referral to orthopedics  ? Decreased range of motion of finger of right hand      ? Relevant Orders  ? AMB referral to orthopedics  ? ?  ? ? ? ?  Follow up: pending labs ?

## 2021-11-06 NOTE — Assessment & Plan Note (Signed)
Microalbuminuria, abnormal protein in the urine related to early stages of kidney disease ?? Hydrate well daily to avoid dehydration ?? Continue olmesartan for now ?

## 2021-11-07 NOTE — Addendum Note (Signed)
Addended by: Carlena Hurl on: 11/07/2021 09:35 AM ? ? Modules accepted: Orders ? ?

## 2021-12-03 ENCOUNTER — Ambulatory Visit (INDEPENDENT_AMBULATORY_CARE_PROVIDER_SITE_OTHER): Payer: Medicaid Other | Admitting: Orthopedic Surgery

## 2021-12-03 ENCOUNTER — Ambulatory Visit (INDEPENDENT_AMBULATORY_CARE_PROVIDER_SITE_OTHER): Payer: Medicaid Other

## 2021-12-03 DIAGNOSIS — M24541 Contracture, right hand: Secondary | ICD-10-CM | POA: Insufficient documentation

## 2021-12-03 DIAGNOSIS — M79641 Pain in right hand: Secondary | ICD-10-CM

## 2021-12-03 NOTE — Progress Notes (Signed)
Office Visit Note   Patient: Dustin Zamora           Date of Birth: 06/22/1976           MRN: [id number] Visit Date: 12/03/2021              Requested by: Carlena Hurl, PA-C [address] [place],  [place] [address] PCP: Carlena Hurl, PA-C   Assessment & Plan: Visit Diagnoses:  1. Pain in right hand   2. Contracture of joint of finger of right hand     Plan: Patient has an approximately 35 degree flexion contracture of the ring finger after an injury 3 months ago.  X-rays today show possible sequelae from a fracture of the ring finger middle phalangeal base but he has a concentric joint with well-maintained joint spaces.  He seems to have an intact central slip based on Landa testing.  He has mild pain in the ring finger PIP joint.  He would like to see a hand therapist to see if we can improve his flexion contracture I can see him back again after some time in therapy.  Follow-Up Instructions: No follow-ups on file.   Orders:  Orders Placed This Encounter  Procedures   XR Hand Complete Right   No orders of the defined types were placed in this encounter.     Procedures: No procedures performed   Clinical Data: No additional findings.   Subjective: Chief Complaint  Patient presents with   Right Hand - Pain    This is a 46 year old left-hand-dominant male who presents with right ring finger PIP joint pain.  He had an injury 3 months ago in which she dropped a heavy object on the finger.  He declined to give any more detail about this event.  Since that time he had pain in the ring finger PIP joint.  He notes that he is unable to straighten his finger all the way.  His pain is 3/10 at worst.  Not had any treatment for this so far.   Review of Systems   Objective: Vital Signs: There were no vitals taken for this visit.  Physical Exam Constitutional:      Appearance: Normal appearance.  Cardiovascular:     Rate and Rhythm: Normal rate.     Pulses:  Normal pulses.  Pulmonary:     Effort: Pulmonary effort is normal.  Skin:    General: Skin is warm and dry.     Capillary Refill: Capillary refill takes less than 2 seconds.  Neurological:     Mental Status: He is alert.    Right Hand Exam   Tenderness  Right hand tenderness location: Mild TTP at ring finger PIP joint w/ mild swelling.  Other  Erythema: absent Sensation: normal Pulse: present  Comments:  Approximately 35 degree fixed flexion contracture at ring finger PIP joint.  Full active flexion of finger.  Central slip appears to be intact w/ Lyla Glassing testing.       Specialty Comments:  No specialty comments available.  Imaging: No results found.   PMFS History: Patient Active Problem List   Diagnosis Date Noted   Contracture of joint of finger of right hand 12/03/2021   BMI 30.0-30.9,adult 12/31/2020   Former smoker 12/31/2020   Plantar wart 12/31/2020   Need for vaccination 05/09/2020   Low libido 05/09/2020   Aortic atherosclerosis ([place]) 05/09/2020   Constipation 10/20/2019   Gastroesophageal reflux disease 10/20/2019   Need for influenza vaccination  05/23/2019   Noncompliance 02/18/2019   History of pancreatitis 10/05/2018   History of alcohol abuse 10/05/2018   Uncontrolled type 2 diabetes mellitus with hyperglycemia ([place]) 08/25/2018   Vaccine counseling 08/25/2018   Microalbuminuria 08/07/2016   Special screening for malignant neoplasms, colon 08/05/2016   Hyperlipidemia 08/05/2016   Erectile dysfunction 08/05/2016   Generalized anxiety disorder 08/05/2016   Obesity 01/04/2015   Hypertension associated with diabetes ([place]) 03/31/2007   Past Medical History:  Diagnosis Date   Anxiety    Chronic back pain    Depression    in remote past   Diabetes mellitus without complication ([place]) 0160   Hyperlipidemia 2008   Hypertension 2008   Keloid    Microalbuminuria    Smoker    Wears glasses     Family History  Problem Relation Age of Onset    Kidney disease Father    Vascular Disease Father    Stroke Paternal Grandmother    Diabetes Paternal Grandmother    Cancer Neg Hx    Heart disease Neg Hx    Colon cancer Neg Hx    Rectal cancer Neg Hx    Stomach cancer Neg Hx     Past Surgical History:  Procedure Laterality Date   ANTERIOR CRUCIATE LIGAMENT REPAIR     right ACL repair, patellar repair, remove cartilage damage; 46yo   Social History   Occupational History   Not on file  Tobacco Use   Smoking status: Former    Packs/day: 0.50    Years: 25.00    Pack years: 12.50    Types: Cigars, Cigarettes   Smokeless tobacco: Never  Vaping Use   Vaping Use: Never used  Substance and Sexual Activity   Alcohol use: Not Currently    Alcohol/week: 4.0 standard drinks    Types: 4 Shots of liquor per week   Drug use: Yes    Frequency: 7.0 times per week    Types: Marijuana    Comment: last used 12/04/19   Sexual activity: Not on file

## 2021-12-05 ENCOUNTER — Other Ambulatory Visit: Payer: Self-pay | Admitting: Medical

## 2021-12-05 NOTE — Telephone Encounter (Signed)
Has an appt in end of July

## 2021-12-15 NOTE — Therapy (Signed)
OUTPATIENT OCCUPATIONAL THERAPY ORTHO EVALUATION  Patient Name: Dustin Zamora MRN: [id number] DOB:1976/01/15, 46 y.o., male Today's Date: 12/16/2021  PCP: Chana Bode, PA-C REFERRING PROVIDER: Sherilyn Cooter, MD    OT End of Session - 12/16/21 0905     Visit Number 1    Number of Visits 9    Date for OT Re-Evaluation 02/21/22    Authorization Type [employer] Medicaid--27 visit limit, no auth needed for    OT Start Time 0809    OT Stop Time 0846    OT Time Calculation (min) 37 min    Activity Tolerance Patient tolerated treatment well    Behavior During Therapy [facility] for tasks assessed/performed             Past Medical History:  Diagnosis Date   Anxiety    Chronic back pain    Depression    in remote past   Diabetes mellitus without complication ([place]) 4098   Hyperlipidemia 2008   Hypertension 2008   Keloid    Microalbuminuria    Smoker    Wears glasses    Past Surgical History:  Procedure Laterality Date   ANTERIOR CRUCIATE LIGAMENT REPAIR     right ACL repair, patellar repair, remove cartilage damage; 46yo   Patient Active Problem List   Diagnosis Date Noted   Contracture of joint of finger of right hand 12/03/2021   BMI 30.0-30.9,adult 12/31/2020   Former smoker 12/31/2020   Plantar wart 12/31/2020   Need for vaccination 05/09/2020   Low libido 05/09/2020   Aortic atherosclerosis ([place]) 05/09/2020   Constipation 10/20/2019   Gastroesophageal reflux disease 10/20/2019   Need for influenza vaccination 05/23/2019   Noncompliance 02/18/2019   History of pancreatitis 10/05/2018   History of alcohol abuse 10/05/2018   Uncontrolled type 2 diabetes mellitus with hyperglycemia ([place]) 08/25/2018   Vaccine counseling 08/25/2018   Microalbuminuria 08/07/2016   Special screening for malignant neoplasms, colon 08/05/2016   Hyperlipidemia 08/05/2016   Erectile dysfunction 08/05/2016   Generalized anxiety disorder 08/05/2016   Obesity 01/04/2015   Hypertension  associated with diabetes ([place]) 03/31/2007    ONSET DATE: approx 2month ago  REFERRING DIAG: Right ring finger PIP flexion contracture after injury   THERAPY DIAG:  Stiffness of right hand, not elsewhere classified  Pain in joint of right hand  Localized edema  Rationale for Evaluation and Treatment Rehabilitation  SUBJECTIVE:   SUBJECTIVE STATEMENT: Pt reports that finger is mainly achy at night.  I feel more pain after these exercises though (up to 6/10)  Pt accompanied by: self  PERTINENT HISTORY:   Pt had an injury 3 months ago in which he dropped a heavy object on the finger. He has not had any treatment for this so far.  Pt with approximately 35 degree flexion contracture of the ring finger. X-rays today show possible sequelae from a fracture of the ring finger middle phalangeal base but he has a concentric joint with well-maintained joint spaces and appears to have an intact central slip.   PMH also includes:  anxiety, depression, HTN, DM, hyperlipidemia, chronic back pain  PRECAUTIONS: None  WEIGHT BEARING RESTRICTIONS No  PAIN:  Are you having pain? No and Yes: NPRS scale: none currently, but up to 3-4/10 Pain location: at PIP Pain description: achy Aggravating factors: stiffness at night, hit it  Relieving factors: movement  FALLS: Has patient fallen in last 6 months? 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  FUNCTIONAL OUTCOME MEASURES: Quick Dash: 22.7%   UPPER EXTREMITY ROM     Grossly WNL execpt R 4th digit PIP ext -25* (flex contracture) with DIP hyperextension 15*, PIP/DIP flexion WNL    HAND FUNCTION: Grip strength: Right: 106 lbs; Left: 114.8 lbs.   Discomfort only in R hand.  COORDINATION: Grossly WNL using R thumb, 2nd, 3rd digit.  SENSATION: Pt reports occasional numbness and tingling throughout the entire BUE at night only (primarily with 4-5th digits)  EDEMA:   mild swelling at R 4th digit PIP   COGNITION: Overall cognitive status: Within functional limits for tasks assessed   TODAY'S TREATMENT:  Pt instructed in HEP--see pt instructions   PATIENT EDUCATION: Education details: Initial HEP for ROM--see pt instructions; eval results and OT POC Person educated: Patient Education method: Explanation, Demonstration, Tactile cues, Verbal cues, and Handouts Education comprehension: verbalized understanding, returned demonstration, and verbal cues required   [place]: 12/16/21:  initial HEP for ROM   GOALS: Potential Goals reviewed with patient? Yes  SHORT TERM GOALS: Target date: 01/20/2022    Pt will be independent with initial HEP. Baseline:  no HEP Goal status: INITIAL  2.  Pt will demo R 4th digit PIP ext of at least -25*. Baseline:  -35* Goal status: INITIAL  3.  Pt will report pain less than or equal to 3/10 with HEP. Baseline:  up to 6/10 Goal status: INITIAL  4.  Pt will be independent with splint wear/care. Baseline:  pt does not have a splint Goal status: INITIAL   LONG TERM GOALS: Target date: 02/20/2022    Pt will be independent with updated HEP Baseline: no HEP Goal status: INITIAL  2.  Pt will demo R 4th digit PIP ext of at least -15*. Baseline:  -35* Goal status: INITIAL  3.  Pt will report pain less than 2/10 consistently. Baseline:  0-6/10 Goal status: INITIAL  4.  Pt will demo at R 4th digit DIP hyperext of 5* or less. Baseline: 15* hyperext  Goal status: INITIAL  5.  Pt improve Quick DASH to 10% or less.. Baseline: 22.7% Goal status: INITIAL   ASSESSMENT:  CLINICAL IMPRESSION: Patient is a 46 y.o. male who was seen today for occupational therapy evaluation for  R 4th digit PIP contracture.  Pt had an injury 3 months ago in which he dropped a heavy object on the finger. He has not had any treatment for this so far.  Per MD notes, X-rays today show possible sequelae from a fracture of the ring finger middle phalangeal base but he has a concentric joint with well-maintained joint spaces and appears to have an intact central slip.  PMH also includes:  anxiety, depression, HTN, DM, hyperlipidemia, chronic back pain.  Pt reports difficulty with finger extension for functional tasks and pain.  Pt presents today with decr ROM, edema, and pain.  Pt would benefit from occupational therapy to address these deficits for decr pain, improved R hand functional use, and decr risk of future complications.  PERFORMANCE DEFICITS in functional skills including ADLs, IADLs, edema, ROM, pain, flexibility, FMC, decreased knowledge of use of DME, and UE functional use.  IMPAIRMENTS are limiting patient from ADLs, IADLs, work, and leisure.   COMORBIDITIES may  have co-morbidities  that affects occupational performance. Patient will benefit from skilled OT to address above impairments and improve overall function.  MODIFICATION OR ASSISTANCE TO COMPLETE EVALUATION: No modification of tasks or assist necessary to complete an evaluation.  OT OCCUPATIONAL PROFILE AND HISTORY: Detailed assessment: Review of records and additional review of physical, cognitive, psychosocial history related to current functional performance.  CLINICAL DECISION MAKING: LOW - limited treatment options, no task modification necessary  REHAB POTENTIAL: Good  EVALUATION COMPLEXITY: Low      PLAN: OT FREQUENCY: 1-2x/week  OT DURATION: 8 weeks +eval (may benefit from 2x week initially, but pt requests 1x/wk due to work schedule)  PLANNED INTERVENTIONS: self care/ADL training, therapeutic exercise, therapeutic activity, neuromuscular re-education, manual therapy, scar mobilization, passive range of  motion, splinting, electrical stimulation, ultrasound, paraffin, fluidotherapy, moist heat, cryotherapy, patient/family education, and DME and/or AE instructions  RECOMMENDED OTHER SERVICES: none  CONSULTED AND AGREED WITH PLAN OF CARE: Patient  PLAN FOR NEXT SESSION: fabricate nighttime splint for PIP ext   [facility], OT 12/16/2021, 10:05 AM  Vianne Bulls, OTR/L [facility] [address] [place], [place]  [address] [phone] phone [phone] 12/16/21 10:05 AM

## 2021-12-16 ENCOUNTER — Encounter: Payer: Self-pay | Admitting: Occupational Therapy

## 2021-12-16 ENCOUNTER — Ambulatory Visit: Payer: Medicaid Other | Attending: Orthopedic Surgery | Admitting: Occupational Therapy

## 2021-12-16 DIAGNOSIS — M25541 Pain in joints of right hand: Secondary | ICD-10-CM | POA: Insufficient documentation

## 2021-12-16 DIAGNOSIS — M25641 Stiffness of right hand, not elsewhere classified: Secondary | ICD-10-CM | POA: Insufficient documentation

## 2021-12-16 DIAGNOSIS — X58XXXA Exposure to other specified factors, initial encounter: Secondary | ICD-10-CM | POA: Insufficient documentation

## 2021-12-16 DIAGNOSIS — R600 Localized edema: Secondary | ICD-10-CM | POA: Diagnosis present

## 2021-12-16 NOTE — Patient Instructions (Signed)
   PIP Extension (Passive    Use thumb of other hand on top of joint and two fingers under- neath on either side to straighten middle joint of ring finger. Hold 20-30 seconds. Repeat 5 times. Do 4-5 sessions per day.   PIP Extension (Active Controlled With Wrist and MP Flexion)    Using other hand to hold  MPs  (big knuckles) at 90 flexion, straighten middle end joints of fingers. Repeat 15 times. Do 4-5 sessions per day.  DIP Flexion (Active Blocked)    Hold ring finger firmly at the middle so that only the tip joint can bend. Hold 3 seconds. Repeat 15 times. Do 4-5 sessions per day.  Extension (Passive)    Keep palm on table, using other hand on top to press down on middle joint of ring finger.  Hold 20-30 seconds. Repeat 5 times. Do 4-5 sessions per day.

## 2021-12-24 ENCOUNTER — Ambulatory Visit: Payer: Medicaid Other | Admitting: Occupational Therapy

## 2021-12-24 ENCOUNTER — Encounter: Payer: Self-pay | Admitting: Occupational Therapy

## 2021-12-24 DIAGNOSIS — M25541 Pain in joints of right hand: Secondary | ICD-10-CM | POA: Diagnosis not present

## 2021-12-24 DIAGNOSIS — M25641 Stiffness of right hand, not elsewhere classified: Secondary | ICD-10-CM

## 2021-12-24 NOTE — Therapy (Signed)
OUTPATIENT OCCUPATIONAL THERAPY ORTHO TREATMENT  Patient Name: Dustin Zamora MRN: [id number] DOB:[birth year], 46 y.o., male Today's Date: 12/24/2021  PCP: Chana Bode, PA-C REFERRING PROVIDER: Sherilyn Cooter, MD    OT End of Session - 12/24/21 0805     Visit Number 2    Number of Visits 9    Date for OT Re-Evaluation 02/21/22    Authorization Type [employer]--27 visit limit, no auth needed for    OT Start Time 0800    OT Stop Time 0845    OT Time Calculation (min) 45 min    Activity Tolerance Patient tolerated treatment well    Behavior During Therapy [facility] for tasks assessed/performed             Past Medical History:  Diagnosis Date   Anxiety    Chronic back pain    Depression    in remote past   Diabetes mellitus without complication ([place]) 2229   Hyperlipidemia 2008   Hypertension 2008   Keloid    Microalbuminuria    Smoker    Wears glasses    Past Surgical History:  Procedure Laterality Date   ANTERIOR CRUCIATE LIGAMENT REPAIR     right ACL repair, patellar repair, remove cartilage damage; 46yo   Patient Active Problem List   Diagnosis Date Noted   Contracture of joint of finger of right hand 12/03/2021   BMI 30.0-30.9,adult 12/31/2020   Former smoker 12/31/2020   Plantar wart 12/31/2020   Need for vaccination 05/09/2020   Low libido 05/09/2020   Aortic atherosclerosis ([place]) 05/09/2020   Constipation 10/20/2019   Gastroesophageal reflux disease 10/20/2019   Need for influenza vaccination 05/23/2019   Noncompliance 02/18/2019   History of pancreatitis 10/05/2018   History of alcohol abuse 10/05/2018   Uncontrolled type 2 diabetes mellitus with hyperglycemia ([place]) 08/25/2018   Vaccine counseling 08/25/2018   Microalbuminuria 08/07/2016   Special screening for malignant neoplasms, colon 08/05/2016   Hyperlipidemia 08/05/2016   Erectile dysfunction 08/05/2016   Generalized anxiety disorder 08/05/2016   Obesity 01/04/2015   Hypertension  associated with diabetes ([place]) 03/31/2007    ONSET DATE: approx 72month ago  REFERRING DIAG: Right ring finger PIP flexion contracture after injury   THERAPY DIAG:  Stiffness of right hand, not elsewhere classified  Pain in joint of right hand  Rationale for Evaluation and Treatment Rehabilitation  SUBJECTIVE:   SUBJECTIVE STATEMENT: No pain today  PERTINENT HISTORY:   Pt had an injury 3 months ago in which he dropped a heavy object on the finger. He has not had any treatment for this so far.  Pt with approximately 35 degree flexion contracture of the ring finger. X-rays today show possible sequelae from a fracture of the ring finger middle phalangeal base but he has a concentric joint with well-maintained joint spaces and appears to have an intact central slip.   PMH also includes:  anxiety, depression, HTN, DM, hyperlipidemia, chronic back pain  PRECAUTIONS: None  PAIN:  Are you having pain? 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  Fabricated and fitted pm splint for Rt ring  PIP extension and issued. Reviewed wear and care   PATIENT EDUCATION: Education details: splint wear and care Person educated: Patient Education method: Explanation and Handouts Education comprehension: verbalized understanding   HOME EXERCISE PROGRAM: 12/16/21:  initial HEP for ROM  12/24/21: splint wear and care GOALS: Potential Goals reviewed with patient? Yes  SHORT TERM GOALS: Target date: 01/20/2022    Pt will be independent with initial HEP. Baseline:  no HEP Goal status: IN  PROGRESS  2.  Pt will demo R 4th digit PIP ext of at least -25*. Baseline:  -35* Goal status: INITIAL  3.  Pt will report pain less than or equal to 3/10 with HEP. Baseline:  up to 6/10 Goal status: INITIAL  4.  Pt will be independent with splint wear/care. Baseline:  pt does not have a splint Goal status: IN PROGRESS    LONG TERM GOALS: Target date: 02/20/2022    Pt will be independent with updated HEP Baseline: no HEP Goal status: INITIAL  2.  Pt will demo R 4th digit PIP ext of at least -15*. Baseline:  -35* Goal status: INITIAL  3.  Pt will report pain less than 2/10 consistently. Baseline:  0-6/10 Goal status: INITIAL  4.  Pt will demo at R 4th digit DIP hyperext of 5* or less. Baseline: 15* hyperext  Goal status: INITIAL  5.  Pt improve Quick DASH to 10% or less.. Baseline: 22.7% Goal status: INITIAL   ASSESSMENT:  CLINICAL IMPRESSION: Pt returns today for splint fabrication. Pt w/ greater understanding of HEP and splint wear and care  PERFORMANCE DEFICITS in functional skills including ADLs, IADLs, edema, ROM, pain, flexibility, FMC, decreased knowledge of use of DME, and UE functional use.  IMPAIRMENTS are limiting patient from ADLs, IADLs, work, and leisure.   COMORBIDITIES may have co-morbidities  that affects occupational performance. Patient will benefit from skilled OT to address above impairments and improve overall function.  MODIFICATION OR ASSISTANCE TO COMPLETE EVALUATION: No modification of tasks or assist necessary to complete an evaluation.  OT OCCUPATIONAL PROFILE AND HISTORY: Detailed assessment: Review of records and additional review of physical, cognitive, psychosocial history related to current functional performance.  CLINICAL DECISION MAKING: LOW - limited treatment options, no task modification necessary  REHAB POTENTIAL: Good  EVALUATION COMPLEXITY: Low      PLAN: OT FREQUENCY: 1-2x/week  OT DURATION: 8 weeks +eval (may  benefit from 2x week initially, but pt requests 1x/wk due to work schedule)  PLANNED INTERVENTIONS: self care/ADL training, therapeutic exercise, therapeutic activity, neuromuscular re-education, manual therapy, scar mobilization, passive range of motion, splinting, electrical stimulation, ultrasound, paraffin, fluidotherapy, moist heat, cryotherapy, patient/family education, and DME and/or AE instructions  RECOMMENDED OTHER SERVICES: none  CONSULTED AND AGREED WITH PLAN OF CARE: Patient  PLAN FOR NEXT SESSION: adjustments to splint prn, review and add to HEP prn   Hans Eden, OT 12/24/2021, 8:06 AM

## 2021-12-24 NOTE — Patient Instructions (Signed)
  WEARING SCHEDULE:  Wear splint at night. However begin by building up tolerance during the day by adding an hour until you can tolerate 4 consecutive hours. May take 1-3 days before switching to night  PURPOSE:  To help straighten middle joint of ring finger  CARE OF SPLINT:  Keep splint away from heat sources including: stove, radiator or furnace, or a car in sunlight. The splint can melt and will no longer fit you properly  Keep away from pets and children  Clean the splint with rubbing alcohol 1-2 times per day.    PRECAUTIONS/POTENTIAL PROBLEMS: *If you notice or experience increased pain, swelling, numbness, or a lingering reddened area from the splint: Take off and bring with you to next appointment for adjustments

## 2022-01-02 ENCOUNTER — Ambulatory Visit: Payer: Medicaid Other | Admitting: Occupational Therapy

## 2022-01-02 DIAGNOSIS — M25541 Pain in joints of right hand: Secondary | ICD-10-CM | POA: Diagnosis not present

## 2022-01-02 DIAGNOSIS — M25641 Stiffness of right hand, not elsewhere classified: Secondary | ICD-10-CM

## 2022-01-02 DIAGNOSIS — R600 Localized edema: Secondary | ICD-10-CM

## 2022-01-02 NOTE — Therapy (Addendum)
OUTPATIENT OCCUPATIONAL THERAPY ORTHO TREATMENT  Patient Name: Dustin Zamora MRN: [id number] DOB:[birth year], 46 y.o., male Today's Date: 01/02/2022  PCP: Chana Bode, PA-C REFERRING PROVIDER: Sherilyn Cooter, MD    OT End of Session - 01/02/22 0834     Visit Number 3    Number of Visits 9    Date for OT Re-Evaluation 02/21/22    Authorization Type [employer] Medicaid--27 visit limit, no auth needed for    OT Start Time 0802    OT Stop Time 0843    OT Time Calculation (min) 41 min    Activity Tolerance Patient tolerated treatment well    Behavior During Therapy [facility] for tasks assessed/performed              Past Medical History:  Diagnosis Date   Anxiety    Chronic back pain    Depression    in remote past   Diabetes mellitus without complication ([place]) 9735   Hyperlipidemia 2008   Hypertension 2008   Keloid    Microalbuminuria    Smoker    Wears glasses    Past Surgical History:  Procedure Laterality Date   ANTERIOR CRUCIATE LIGAMENT REPAIR     right ACL repair, patellar repair, remove cartilage damage; 46yo   Patient Active Problem List   Diagnosis Date Noted   Contracture of joint of finger of right hand 12/03/2021   BMI 30.0-30.9,adult 12/31/2020   Former smoker 12/31/2020   Plantar wart 12/31/2020   Need for vaccination 05/09/2020   Low libido 05/09/2020   Aortic atherosclerosis ([place]) 05/09/2020   Constipation 10/20/2019   Gastroesophageal reflux disease 10/20/2019   Need for influenza vaccination 05/23/2019   Noncompliance 02/18/2019   History of pancreatitis 10/05/2018   History of alcohol abuse 10/05/2018   Uncontrolled type 2 diabetes mellitus with hyperglycemia ([place]) 08/25/2018   Vaccine counseling 08/25/2018   Microalbuminuria 08/07/2016   Special screening for malignant neoplasms, colon 08/05/2016   Hyperlipidemia 08/05/2016   Erectile dysfunction 08/05/2016   Generalized anxiety disorder 08/05/2016   Obesity 01/04/2015   Hypertension  associated with diabetes ([place]) 03/31/2007    ONSET DATE: approx 10month ago  REFERRING DIAG: Right ring finger PIP flexion contracture after injury   THERAPY DIAG:  Stiffness of right hand, not elsewhere classified  Pain in joint of right hand  Localized edema  Rationale for Evaluation and Treatment Rehabilitation  SUBJECTIVE:   SUBJECTIVE STATEMENT: No pain today  PERTINENT HISTORY:   Pt had an injury 3 months ago in which he dropped a heavy object on the finger. He has not had any treatment for this so far.  Pt with approximately 35 degree flexion contracture of the ring finger. X-rays today show possible sequelae from a fracture of the ring finger middle phalangeal base but he has a concentric joint with well-maintained joint spaces and appears to have an intact central slip.   PMH also includes:  anxiety, depression, HTN, DM, hyperlipidemia, chronic back pain  PRECAUTIONS: None  PAIN:  Are you having pain? 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 Splint appears to be fitting well . Therapist provided new strapping for PIP joint. Pt had redness at PIP joint initally after splint was removed. This improved throughout the session.Therapist recommends that pt wears the splint for 2-3 hrs at a time at night while watching TV initially. Therapist recommends pt does not wear splint during activity or driving for safety. Fluidotherapy x 10 mins for pain and stiffness, no adverse reactions.  Therapist reviewed the  exercises issued on initally visit with pt. Pt was shown how to perform retrograde massage to the digit and he returned demonstration. PIP ext -30 today after exercise.    PATIENT EDUCATION: Education details: splint wear and care, initial HEP review. Retrograde massage. Person educated: Patient Education method: Explanation and Handouts Education comprehension: verbalized understanding   HOME EXERCISE PROGRAM: 12/16/21:  initial HEP for ROM  12/24/21: splint wear and care GOALS: Potential Goals reviewed with patient? Yes  SHORT TERM GOALS: Target date: 01/20/2022    Pt will be independent with initial HEP. Baseline:  no HEP Goal status: IN PROGRESS  2.  Pt will demo R 4th digit PIP ext of at least -25*. Baseline:  -35* Goal status: INITIAL  3.  Pt will report pain less than or equal to 3/10 with HEP. Baseline:  up to 6/10 Goal status: INITIAL  4.  Pt will be independent with splint wear/care. Baseline:  pt does not have a splint Goal status: IN PROGRESS    LONG TERM GOALS: Target date: 02/20/2022    Pt will be independent with updated HEP Baseline: no HEP Goal status: INITIAL  2.  Pt will demo R 4th digit PIP ext of at least -15*. Baseline:  -35* Goal status: INITIAL  3.  Pt will report pain less than 2/10 consistently. Baseline:  0-6/10 Goal status: INITIAL  4.  Pt will demo at R 4th digit DIP hyperext of 5* or less. Baseline: 15* hyperext  Goal status: INITIAL  5.  Pt improve Quick DASH to 10% or less.. Baseline: 22.7% Goal status: INITIAL   ASSESSMENT:  CLINICAL IMPRESSION: Pt returns today for splint fabrication. Pt w/ greater understanding of HEP and splint wear and care  PERFORMANCE DEFICITS in functional skills including ADLs, IADLs, edema, ROM, pain, flexibility, FMC, decreased knowledge of use of DME, and UE functional use.  IMPAIRMENTS are limiting patient from ADLs, IADLs, work, and leisure.   COMORBIDITIES may have co-morbidities  that  affects occupational performance. Patient will benefit from skilled OT to address above impairments and improve overall function.  MODIFICATION OR ASSISTANCE TO COMPLETE EVALUATION: No modification of tasks or assist necessary to complete an evaluation.  OT OCCUPATIONAL PROFILE AND HISTORY: Detailed assessment: Review of records and additional review of physical, cognitive, psychosocial history related to current functional performance.  CLINICAL DECISION MAKING: LOW - limited treatment options, no task modification necessary  REHAB POTENTIAL: Good  EVALUATION COMPLEXITY: Low      PLAN: OT FREQUENCY: 1-2x/week  OT DURATION: 8 weeks +eval (may benefit from 2x week initially, but pt requests 1x/wk due to work schedule)  PLANNED INTERVENTIONS: self care/ADL training, therapeutic exercise, therapeutic activity, neuromuscular re-education, manual therapy, scar mobilization, passive range of motion, splinting, electrical stimulation, ultrasound, paraffin, fluidotherapy, moist heat, cryotherapy, patient/family education, and DME and/or AE instructions  RECOMMENDED OTHER SERVICES: none  CONSULTED AND AGREED WITH PLAN OF CARE: Patient  PLAN FOR NEXT SESSION: progress as able, add to HEP prn   Mendi Constable,  OT 01/02/2022, 9:00 AM

## 2022-01-03 ENCOUNTER — Other Ambulatory Visit: Payer: Self-pay | Admitting: Medical

## 2022-01-03 DIAGNOSIS — N529 Male erectile dysfunction, unspecified: Secondary | ICD-10-CM

## 2022-01-07 ENCOUNTER — Ambulatory Visit: Payer: Medicaid Other | Admitting: Occupational Therapy

## 2022-01-07 ENCOUNTER — Encounter: Payer: Self-pay | Admitting: Occupational Therapy

## 2022-01-07 DIAGNOSIS — R600 Localized edema: Secondary | ICD-10-CM

## 2022-01-07 DIAGNOSIS — M25541 Pain in joints of right hand: Secondary | ICD-10-CM

## 2022-01-07 DIAGNOSIS — M25641 Stiffness of right hand, not elsewhere classified: Secondary | ICD-10-CM

## 2022-01-13 ENCOUNTER — Encounter: Payer: Self-pay | Admitting: Occupational Therapy

## 2022-01-13 ENCOUNTER — Ambulatory Visit: Payer: Medicaid Other | Attending: Medical | Admitting: Occupational Therapy

## 2022-01-13 DIAGNOSIS — Z5189 Encounter for other specified aftercare: Secondary | ICD-10-CM | POA: Diagnosis not present

## 2022-01-13 DIAGNOSIS — R600 Localized edema: Secondary | ICD-10-CM | POA: Diagnosis present

## 2022-01-13 DIAGNOSIS — M25641 Stiffness of right hand, not elsewhere classified: Secondary | ICD-10-CM | POA: Insufficient documentation

## 2022-01-13 DIAGNOSIS — M25541 Pain in joints of right hand: Secondary | ICD-10-CM | POA: Diagnosis present

## 2022-01-13 NOTE — Therapy (Signed)
OUTPATIENT OCCUPATIONAL THERAPY ORTHO TREATMENT  Patient Name: Dustin Zamora MRN: [id number] DOB:10/21/1975, 46 y.o., male Today's Date: 01/13/2022  PCP: Chana Bode, PA-C REFERRING PROVIDER: Sherilyn Cooter, MD    OT End of Session - 01/13/22 0810     Visit Number 5    Number of Visits 9    Date for OT Re-Evaluation 02/21/22    Authorization Type [employer] Medicaid--27 visit limit, no auth needed for    OT Start Time 0803    OT Stop Time 0845    OT Time Calculation (min) 42 min    Activity Tolerance Patient tolerated treatment well    Behavior During Therapy [facility] for tasks assessed/performed               Past Medical History:  Diagnosis Date   Anxiety    Chronic back pain    Depression    in remote past   Diabetes mellitus without complication ([place]) 5400   Hyperlipidemia 2008   Hypertension 2008   Keloid    Microalbuminuria    Smoker    Wears glasses    Past Surgical History:  Procedure Laterality Date   ANTERIOR CRUCIATE LIGAMENT REPAIR     right ACL repair, patellar repair, remove cartilage damage; 46yo   Patient Active Problem List   Diagnosis Date Noted   Contracture of joint of finger of right hand 12/03/2021   BMI 30.0-30.9,adult 12/31/2020   Former smoker 12/31/2020   Plantar wart 12/31/2020   Need for vaccination 05/09/2020   Low libido 05/09/2020   Aortic atherosclerosis ([place]) 05/09/2020   Constipation 10/20/2019   Gastroesophageal reflux disease 10/20/2019   Need for influenza vaccination 05/23/2019   Noncompliance 02/18/2019   History of pancreatitis 10/05/2018   History of alcohol abuse 10/05/2018   Uncontrolled type 2 diabetes mellitus with hyperglycemia ([place]) 08/25/2018   Vaccine counseling 08/25/2018   Microalbuminuria 08/07/2016   Special screening for malignant neoplasms, colon 08/05/2016   Hyperlipidemia 08/05/2016   Erectile dysfunction 08/05/2016   Generalized anxiety disorder 08/05/2016   Obesity 01/04/2015   Hypertension  associated with diabetes ([place]) 03/31/2007    ONSET DATE: approx 32month ago  REFERRING DIAG: Right ring finger PIP flexion contracture after injury   THERAPY DIAG:  Stiffness of right hand, not elsewhere classified  Pain in joint of right hand  Localized edema  Rationale for Evaluation and Treatment Rehabilitation  SUBJECTIVE:   SUBJECTIVE STATEMENT: I am not wearing the splint the whole night; I cannot tolerate it.   PERTINENT HISTORY:   Pt had an injury 3 months ago in which he dropped a heavy object on the finger. He has not had any treatment for this so far.  Pt with approximately 35 degree flexion contracture of the ring finger. X-rays today show possible sequelae from a fracture of the ring finger middle phalangeal base but he has a concentric joint with well-maintained joint spaces and appears to have an intact central slip.   PMH also includes:  anxiety, depression, HTN, DM, hyperlipidemia, chronic back pain  PRECAUTIONS: None  PAIN:  Are you having pain? 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     PROM to PIP in ext (isolated, then  with DIP flex, then with MP flex).  PROM composite finger ext and composite IP ext with MP flex.    AROM:  MP blocked in flex with PIP flex/ext, PIP blocked in ext with DIP flex/ext    Attempted to size the spring PIP extension assist finger splint, however uncomfortable and pt admits he would not wear  PATIENT EDUCATION: Education details: reviewed initial HEP; Instructed pt to perform PROM (wt. Bear) stretch for finger ext after sustained gripping activities (lifting weights) and try to stretch frequently at stoplights, etc with driving/sustained  grip. Person educated: Patient Education method: Customer service manager Education comprehension: verbalized understanding and returned demonstration   HOME EXERCISE PROGRAM: 12/16/21:  initial HEP for ROM  12/24/21: splint wear and care   GOALS: Potential Goals reviewed with patient? Yes  SHORT TERM GOALS: Target date: 01/20/2022    Pt will be independent with initial HEP. Baseline:  no HEP Goal status: IN PROGRESS  2.  Pt will demo R 4th digit PIP ext of at least -25*. Baseline:  -35* Goal status: IN PROGRESS   01/07/22:  -30* prior to exercise, -15* after exercise  3.  Pt will report pain less than or equal to 3/10 with HEP. Baseline:  up to 6/10 Goal status: MET.  01/07/22:  met.  4.  Pt will be independent with splint wear/care. Baseline:  pt does not have a splint Goal status: IN PROGRESS    LONG TERM GOALS: Target date: 02/20/2022    Pt will be independent with updated HEP Baseline: no HEP Goal status: INITIAL  2.  Pt will demo R 4th digit PIP ext of at least -15*. Baseline:  -35* Goal status: INITIAL  3.  Pt will report pain less than 2/10 consistently. Baseline:  0-6/10 Goal status: INITIAL  4.  Pt will demo at R 4th digit DIP hyperext of 5* or less. Baseline: 15* hyperext  Goal status: INITIAL  5.  Pt improve Quick DASH to 10% or less.. Baseline: 22.7% Goal status: INITIAL   ASSESSMENT:  CLINICAL IMPRESSION: Pt is progressing with improving ROM, less stiffness , and decr pain and edema.  PERFORMANCE DEFICITS in functional skills including ADLs, IADLs, edema, ROM, pain, flexibility, FMC, decreased knowledge of use of DME, and UE functional use.  IMPAIRMENTS are limiting patient from ADLs, IADLs, work, and leisure.   COMORBIDITIES may have co-morbidities  that affects occupational performance. Patient will benefit from skilled OT to address above impairments and improve overall function.  MODIFICATION OR ASSISTANCE TO COMPLETE EVALUATION: No  modification of tasks or assist necessary to complete an evaluation.  OT OCCUPATIONAL PROFILE AND HISTORY: Detailed assessment: Review of records and additional review of physical, cognitive, psychosocial history related to current functional performance.  CLINICAL DECISION MAKING: LOW - limited treatment options, no task modification necessary  REHAB POTENTIAL: Good  EVALUATION COMPLEXITY: Low      PLAN: OT FREQUENCY: 1-2x/week  OT DURATION: 8 weeks +eval (may benefit from 2x week initially, but pt requests 1x/wk due to work schedule)  PLANNED INTERVENTIONS: self care/ADL training, therapeutic exercise, therapeutic activity, neuromuscular re-education, manual therapy, scar mobilization, passive range of motion, splinting, electrical stimulation, ultrasound, paraffin, fluidotherapy, moist heat, cryotherapy, patient/family education, and DME and/or AE instructions  RECOMMENDED OTHER SERVICES: none  CONSULTED AND AGREED WITH PLAN OF CARE: Patient  PLAN FOR NEXT SESSION:  try paraffin next session, continue ROM, assess grip strength, assess STG's.    Hans Eden, OTR/L 01/13/2022, 8:10 AM

## 2022-01-16 ENCOUNTER — Other Ambulatory Visit: Payer: Self-pay | Admitting: Gastroenterology

## 2022-01-19 NOTE — Therapy (Signed)
OUTPATIENT OCCUPATIONAL THERAPY ORTHO TREATMENT  Patient Name: Dustin Zamora MRN: [id number] DOB:01/01/1976, 46 y.o., male Today's Date: 01/19/2022  PCP: Chana Bode, PA-C REFERRING PROVIDER: Sherilyn Cooter, MD        Past Medical History:  Diagnosis Date   Anxiety    Chronic back pain    Depression    in remote past   Diabetes mellitus without complication ([place]) 0630   Hyperlipidemia 2008   Hypertension 2008   Keloid    Microalbuminuria    Smoker    Wears glasses    Past Surgical History:  Procedure Laterality Date   ANTERIOR CRUCIATE LIGAMENT REPAIR     right ACL repair, patellar repair, remove cartilage damage; 46yo   Patient Active Problem List   Diagnosis Date Noted   Contracture of joint of finger of right hand 12/03/2021   BMI 30.0-30.9,adult 12/31/2020   Former smoker 12/31/2020   Plantar wart 12/31/2020   Need for vaccination 05/09/2020   Low libido 05/09/2020   Aortic atherosclerosis ([place]) 05/09/2020   Constipation 10/20/2019   Gastroesophageal reflux disease 10/20/2019   Need for influenza vaccination 05/23/2019   Noncompliance 02/18/2019   History of pancreatitis 10/05/2018   History of alcohol abuse 10/05/2018   Uncontrolled type 2 diabetes mellitus with hyperglycemia ([place]) 08/25/2018   Vaccine counseling 08/25/2018   Microalbuminuria 08/07/2016   Special screening for malignant neoplasms, colon 08/05/2016   Hyperlipidemia 08/05/2016   Erectile dysfunction 08/05/2016   Generalized anxiety disorder 08/05/2016   Obesity 01/04/2015   Hypertension associated with diabetes ([place]) 03/31/2007    ONSET DATE: approx 67month ago  REFERRING DIAG: Right ring finger PIP flexion contracture after injury   THERAPY DIAG:  No diagnosis found.  Rationale for Evaluation and Treatment Rehabilitation  SUBJECTIVE:   SUBJECTIVE STATEMENT: *** I am not wearing the splint the whole night; I cannot tolerate it.   PERTINENT HISTORY:   Pt had an injury 3  months ago in which he dropped a heavy object on the finger. He has not had any treatment for this so far.  Pt with approximately 35 degree flexion contracture of the ring finger. X-rays today show possible sequelae from a fracture of the ring finger middle phalangeal base but he has a concentric joint with well-maintained joint spaces and appears to have an intact central slip.   PMH also includes:  anxiety, depression, HTN, DM, hyperlipidemia, chronic back pain  PRECAUTIONS: None  PAIN:  Are you having pain? 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     PROM to PIP in ext (isolated, then with DIP flex, then with MP flex).  PROM composite finger ext and composite IP ext with MP flex.    AROM:  MP blocked in flex with PIP flex/ext, PIP blocked in ext with DIP flex/ext    Attempted to size the spring PIP extension assist finger splint, however uncomfortable and pt admits he would not wear  PATIENT EDUCATION: Education details: *** reviewed initial HEP; Instructed pt to perform PROM (wt. Bear) stretch for finger ext after sustained gripping activities (lifting weights) and try to stretch frequently at stoplights, etc with driving/sustained grip. Person educated: Patient Education method: EDance movement psychotherapist  comprehension: verbalized understanding and returned demonstration   HOME EXERCISE PROGRAM: 12/16/21:  initial HEP for ROM  12/24/21: splint wear and care   GOALS: Potential Goals reviewed with patient? Yes  SHORT TERM GOALS: Target date: 01/20/2022    Pt will be independent with initial HEP. Baseline:  no HEP Goal status: IN PROGRESS  2.  Pt will demo R 4th digit PIP ext of at  least -25*. Baseline:  -35* Goal status: IN PROGRESS   01/07/22:  -30* prior to exercise, -15* after exercise  3.  Pt will report pain less than or equal to 3/10 with HEP. Baseline:  up to 6/10 Goal status: MET.  01/07/22:  met.  4.  Pt will be independent with splint wear/care. Baseline:  pt does not have a splint Goal status: IN PROGRESS    LONG TERM GOALS: Target date: 02/20/2022    Pt will be independent with updated HEP Baseline: no HEP Goal status: INITIAL  2.  Pt will demo R 4th digit PIP ext of at least -15*. Baseline:  -35* Goal status: INITIAL  3.  Pt will report pain less than 2/10 consistently. Baseline:  0-6/10 Goal status: INITIAL  4.  Pt will demo at R 4th digit DIP hyperext of 5* or less. Baseline: 15* hyperext  Goal status: INITIAL  5.  Pt improve Quick DASH to 10% or less.. Baseline: 22.7% Goal status: INITIAL   ASSESSMENT:  CLINICAL IMPRESSION: *** Pt is progressing with improving ROM, less stiffness , and decr pain and edema.  PERFORMANCE DEFICITS in functional skills including ADLs, IADLs, edema, ROM, pain, flexibility, FMC, decreased knowledge of use of DME, and UE functional use.  IMPAIRMENTS are limiting patient from ADLs, IADLs, work, and leisure.   COMORBIDITIES may have co-morbidities  that affects occupational performance. Patient will benefit from skilled OT to address above impairments and improve overall function.  MODIFICATION OR ASSISTANCE TO COMPLETE EVALUATION: No modification of tasks or assist necessary to complete an evaluation.  OT OCCUPATIONAL PROFILE AND HISTORY: Detailed assessment: Review of records and additional review of physical, cognitive, psychosocial history related to current functional performance.  CLINICAL DECISION MAKING: LOW - limited treatment options, no task modification necessary  REHAB POTENTIAL: Good  EVALUATION COMPLEXITY: Low      PLAN: OT FREQUENCY: 1-2x/week  OT DURATION: 8 weeks +eval (may  benefit from 2x week initially, but pt requests 1x/wk due to work schedule)  PLANNED INTERVENTIONS: self care/ADL training, therapeutic exercise, therapeutic activity, neuromuscular re-education, manual therapy, scar mobilization, passive range of motion, splinting, electrical stimulation, ultrasound, paraffin, fluidotherapy, moist heat, cryotherapy, patient/family education, and DME and/or AE instructions  RECOMMENDED OTHER SERVICES: none  CONSULTED AND AGREED WITH PLAN OF CARE: Patient  PLAN FOR NEXT SESSION:  *** try paraffin next session, continue ROM, assess grip strength, assess STG's.    Donisha Hoch, OTR/L 01/19/2022, 10:00 AM

## 2022-01-20 ENCOUNTER — Ambulatory Visit: Payer: Medicaid Other | Admitting: Occupational Therapy

## 2022-01-20 ENCOUNTER — Encounter: Payer: Self-pay | Admitting: Occupational Therapy

## 2022-01-20 DIAGNOSIS — M25641 Stiffness of right hand, not elsewhere classified: Secondary | ICD-10-CM | POA: Diagnosis not present

## 2022-01-20 DIAGNOSIS — M25541 Pain in joints of right hand: Secondary | ICD-10-CM

## 2022-01-20 DIAGNOSIS — R600 Localized edema: Secondary | ICD-10-CM

## 2022-01-20 NOTE — Patient Instructions (Addendum)
1. Grip Strengthening (Resistive Putty)   Squeeze putty using thumb and all fingers. Repeat 20 times. Do 2 sessions per day.   Extension (Assistive Putty)   Roll putty back and forth, being sure to use all fingertips. Repeat 2 times. Do 2 sessions per day.  Then pinch as below.   Palmar Pinch Strengthening (Resistive Putty)   Pinch putty between thumb and ring finger after rolling out    IP Fisting (Resistive Putty)   Keeping knuckles straight, bend fingertips to squeeze putty. Repeat 10 times. Do 2 sessions per day.  MP Flexion (Resistive Putty)   Bending only at large knuckles, press putty down against thumb. Keep fingertips straight. Repeat 10 times. Do 2 sessions per day.   FINGERS: Extension (Putty)    Open hand and fingers to flatten putty. 10 reps per set, 2x/ day

## 2022-01-21 ENCOUNTER — Other Ambulatory Visit: Payer: Self-pay | Admitting: Gastroenterology

## 2022-01-21 ENCOUNTER — Encounter: Payer: Medicaid Other | Admitting: Occupational Therapy

## 2022-01-23 NOTE — Therapy (Signed)
OUTPATIENT OCCUPATIONAL THERAPY ORTHO TREATMENT  Patient Name: Dustin Zamora MRN: [id number] DOB:Dec 16, 1975, 46 y.o., male Today's Date: 01/27/2022  PCP: Chana Bode, PA-C REFERRING PROVIDER: Sherilyn Cooter, MD    OT End of Session - 01/27/22 0805     Visit Number 7    Number of Visits 9    Date for OT Re-Evaluation 02/21/22    Authorization Type U[employer] Medicaid--27 visit limit, no auth needed    OT Start Time 0801    OT Stop Time 0845    OT Time Calculation (min) 44 min    Activity Tolerance Patient tolerated treatment well    Behavior During Therapy [facility] for tasks assessed/performed                 Past Medical History:  Diagnosis Date   Anxiety    Chronic back pain    Depression    in remote past   Diabetes mellitus without complication ([place]) 9675   Hyperlipidemia 2008   Hypertension 2008   Keloid    Microalbuminuria    Smoker    Wears glasses    Past Surgical History:  Procedure Laterality Date   ANTERIOR CRUCIATE LIGAMENT REPAIR     right ACL repair, patellar repair, remove cartilage damage; 46yo   Patient Active Problem List   Diagnosis Date Noted   Contracture of joint of finger of right hand 12/03/2021   BMI 30.0-30.9,adult 12/31/2020   Former smoker 12/31/2020   Plantar wart 12/31/2020   Need for vaccination 05/09/2020   Low libido 05/09/2020   Aortic atherosclerosis ([place]) 05/09/2020   Constipation 10/20/2019   Gastroesophageal reflux disease 10/20/2019   Need for influenza vaccination 05/23/2019   Noncompliance 02/18/2019   History of pancreatitis 10/05/2018   History of alcohol abuse 10/05/2018   Uncontrolled type 2 diabetes mellitus with hyperglycemia ([place]) 08/25/2018   Vaccine counseling 08/25/2018   Microalbuminuria 08/07/2016   Special screening for malignant neoplasms, colon 08/05/2016   Hyperlipidemia 08/05/2016   Erectile dysfunction 08/05/2016   Generalized anxiety disorder 08/05/2016   Obesity 01/04/2015   Hypertension  associated with diabetes ([place]) 03/31/2007    ONSET DATE: approx 46month ago  REFERRING DIAG: Right ring finger PIP flexion contracture after injury   THERAPY DIAG:  Stiffness of right hand, not elsewhere classified  Pain in joint of right hand  Localized edema  Rationale for Evaluation and Treatment Rehabilitation  SUBJECTIVE:   SUBJECTIVE STATEMENT:  Pt reports improvements in ability to tolerate the splint    PERTINENT HISTORY:   Pt had an injury 3 months ago in which he dropped a heavy object on the finger. He has not had any treatment for this so far.  Pt with approximately 35 degree flexion contracture of the ring finger. X-rays today show possible sequelae from a fracture of the ring finger middle phalangeal base but he has a concentric joint with well-maintained joint spaces and appears to have an intact central slip.   PMH also includes:  anxiety, depression, HTN, DM, hyperlipidemia, chronic back pain  PRECAUTIONS: None  PAIN:  Are you having pain? 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     PROM to PIP in ext (isolated, then with  DIP flex, then with MP flex).  PROM composite finger ext and composite IP ext with MP flex.    AROM:  MP blocked in flex with PIP flex/ext, PIP blocked in ext with DIP flex/ext    Ultrasound x 53mn to volar 3-4th digits, 370m, 0.8wts/cm2, 20% with no adverse reactions for stiffness    Picking up blocks using gripper set of level 3 (black spring) with min difficulty for incr sustained grip strength.      Began checking goals and discussing progress as well as checked Quick Dash--see below    PATIENT EDUCATION: Education details: n/a today  Person educated:   Education method:  Education comprehension:    HOME EXERCISE PROGRAM: 12/16/21:  initial HEP for ROM  12/24/21: splint wear and care 01/20/22:  Yellow putty HEP   GOALS: Potential Goals reviewed with patient? Yes  SHORT TERM GOALS: Target date: 01/20/2022    Pt will be independent with initial HEP. Baseline:  no HEP Goal status: MET 01/20/22  2.  Pt will demo R 4th digit PIP ext of at least -25*. Baseline:  -35* Goal status: MET   01/07/22:  -30* prior to exercise, -15* after exercise.  01/20/22:  15* prior to exercise today   3.  Pt will report pain less than or equal to 3/10 with HEP. Baseline:  up to 6/10 Goal status: MET.  01/07/22:  met.  4.  Pt will be independent with splint wear/care. Baseline:  pt does not have a splint Goal status: MET  01/20/22 able to wear splint all night last night   LONG TERM GOALS: Target date: 02/20/2022    Pt will be independent with updated HEP Baseline: no HEP Goal status: MET  2.  Pt will demo R 4th digit PIP ext of at least -15*.--Updated to less than or equal to -10* (01/27/22) Baseline:  -35* Goal status: MET  initial goal 01/27/22:  -15*  3.  Pt will report pain less than 2/10 consistently. Baseline:  0-6/10 Goal status: MET   01/27/22  4.  Pt will demo at R 4th digit DIP hyperext of 5* or less. Baseline: 15* hyperext  Goal status: IN PROGRESS  01/20/22 10* hyperext   5.  Pt improve Quick DASH to 10% or less.. Baseline: 22.7% Goal status: MET.  01/27/22  6.8%   ASSESSMENT:  CLINICAL IMPRESSION: Pt is progressing with improving ROM, less stiffness, and decr pain and edema.  PERFORMANCE DEFICITS in functional skills including ADLs, IADLs, edema, ROM, pain, flexibility, FMC, decreased knowledge of use of DME, and UE functional use.  IMPAIRMENTS are limiting patient from ADLs, IADLs, work, and leisure.   COMORBIDITIES may have co-morbidities  that affects occupational performance. Patient will benefit from skilled OT to address  above impairments and improve overall function.  MODIFICATION OR ASSISTANCE TO COMPLETE EVALUATION: No modification of tasks or assist necessary to complete an evaluation.  OT OCCUPATIONAL PROFILE AND HISTORY: Detailed assessment: Review of records and additional review of physical, cognitive, psychosocial history related to current functional performance.  CLINICAL DECISION MAKING: LOW - limited treatment options, no task modification necessary  REHAB POTENTIAL: Good  EVALUATION COMPLEXITY: Low    PLAN: OT FREQUENCY: 1-2x/week  OT DURATION: 8 weeks +eval (may benefit from 2x week initially, but pt requests 1x/wk due to work schedule)  PLANNED INTERVENTIONS: self care/ADL training, therapeutic exercise, therapeutic activity, neuromuscular re-education, manual therapy, scar mobilization, passive range of motion, splinting, electrical stimulation, ultrasound, paraffin, fluidotherapy, moist heat, cryotherapy, patient/family education,  and DME and/or AE instructions  RECOMMENDED OTHER SERVICES: none  CONSULTED AND AGREED WITH PLAN OF CARE: Patient  PLAN FOR NEXT SESSION:   paraffin;  continue ROM, light strengthening, requested pt bring putty and splint for check next session   [place], OTR/L 01/27/2022, 8:05 AM

## 2022-01-27 ENCOUNTER — Encounter: Payer: Self-pay | Admitting: Occupational Therapy

## 2022-01-27 ENCOUNTER — Ambulatory Visit: Payer: Medicaid Other | Admitting: Occupational Therapy

## 2022-01-27 DIAGNOSIS — M25641 Stiffness of right hand, not elsewhere classified: Secondary | ICD-10-CM | POA: Diagnosis not present

## 2022-01-27 DIAGNOSIS — M25541 Pain in joints of right hand: Secondary | ICD-10-CM

## 2022-01-27 DIAGNOSIS — R600 Localized edema: Secondary | ICD-10-CM

## 2022-01-31 ENCOUNTER — Telehealth: Payer: Self-pay | Admitting: Medical

## 2022-01-31 NOTE — Telephone Encounter (Signed)
P.A. FARXIGA RENEWAL

## 2022-02-05 NOTE — Therapy (Signed)
OUTPATIENT OCCUPATIONAL THERAPY ORTHO TREATMENT  Patient Name: Dustin Zamora MRN: [id number] DOB:Jul 24, 1975, 46 y.o., male Today's Date: 02/06/2022  PCP: Chana Bode, PA-C REFERRING PROVIDER: Sherilyn Cooter, MD    OT End of Session - 02/06/22 6283     Visit Number 8    Number of Visits 9    Date for OT Re-Evaluation 02/21/22    Authorization Type [employer] Medicaid--27 visit limit, no auth needed    OT Start Time 0804    OT Stop Time 0845    OT Time Calculation (min) 41 min    Activity Tolerance Patient tolerated treatment well    Behavior During Therapy [facility] for tasks assessed/performed                  Past Medical History:  Diagnosis Date   Anxiety    Chronic back pain    Depression    in remote past   Diabetes mellitus without complication ([place]) 6629   Hyperlipidemia 2008   Hypertension 2008   Keloid    Microalbuminuria    Smoker    Wears glasses    Past Surgical History:  Procedure Laterality Date   ANTERIOR CRUCIATE LIGAMENT REPAIR     right ACL repair, patellar repair, remove cartilage damage; 46yo   Patient Active Problem List   Diagnosis Date Noted   Contracture of joint of finger of right hand 12/03/2021   BMI 30.0-30.9,adult 12/31/2020   Former smoker 12/31/2020   Plantar wart 12/31/2020   Need for vaccination 05/09/2020   Low libido 05/09/2020   Aortic atherosclerosis ([place]) 05/09/2020   Constipation 10/20/2019   Gastroesophageal reflux disease 10/20/2019   Need for influenza vaccination 05/23/2019   Noncompliance 02/18/2019   History of pancreatitis 10/05/2018   History of alcohol abuse 10/05/2018   Uncontrolled type 2 diabetes mellitus with hyperglycemia ([place]) 08/25/2018   Vaccine counseling 08/25/2018   Microalbuminuria 08/07/2016   Special screening for malignant neoplasms, colon 08/05/2016   Hyperlipidemia 08/05/2016   Erectile dysfunction 08/05/2016   Generalized anxiety disorder 08/05/2016   Obesity 01/04/2015   Hypertension  associated with diabetes ([place]) 03/31/2007    ONSET DATE: approx 54months ago  REFERRING DIAG: Right ring finger PIP flexion contracture after injury   THERAPY DIAG:  Stiffness of right hand, not elsewhere classified  Pain in joint of right hand  Localized edema  Rationale for Evaluation and Treatment Rehabilitation  SUBJECTIVE:   SUBJECTIVE STATEMENT:  Pt reports that he has been wearing the splint all night   PERTINENT HISTORY:   Pt had an injury 3 months ago in which he dropped a heavy object on the finger. He has not had any treatment for this so far.  Pt with approximately 35 degree flexion contracture of the ring finger. X-rays today show possible sequelae from a fracture of the ring finger middle phalangeal base but he has a concentric joint with well-maintained joint spaces and appears to have an intact central slip.   PMH also includes:  anxiety, depression, HTN, DM, hyperlipidemia, chronic back pain  PRECAUTIONS: None  PAIN:  Are you having pain? 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     PROM to PIP in ext (isolated,  then with DIP flex, then with MP flex).  PROM composite finger ext and composite IP ext with MP flex.    AROM:  MP blocked in flex with PIP flex/ext, PIP blocked in ext with DIP flex/ext    Ultrasound x 60mn to volar 3-4th digits, 34m, 0.8wts/cm2, 20% with no adverse reactions for stiffness    Checked splint--continues to fit well.  Pt instructed to tighten strap for short periods of time for incr stretch.      PATIENT EDUCATION: Education details: updated putty HEP to red--pt returned demo each. Person educated: pt Education method:  demo, v.Field seismologistEducation comprehension:  returned [place]6/5/23:  initial HEP for ROM  12/24/21: splint wear and care 01/20/22:  Yellow putty HEP 02/06/22:  updated HEP to red   GOALS: Potential Goals reviewed with patient? Yes  SHORT TERM GOALS: Target date: 01/20/2022    Pt will be independent with initial HEP. Baseline:  no HEP Goal status: MET 01/20/22  2.  Pt will demo R 4th digit PIP ext of at least -25*. Baseline:  -35* Goal status: MET   01/07/22:  -30* prior to exercise, -15* after exercise.  01/20/22:  15* prior to exercise today   3.  Pt will report pain less than or equal to 3/10 with HEP. Baseline:  up to 6/10 Goal status: MET.  01/07/22:  met.  4.  Pt will be independent with splint wear/care. Baseline:  pt does not have a splint Goal status: MET  01/20/22 able to wear splint all night last night   LONG TERM GOALS: Target date: 02/20/2022    Pt will be independent with updated HEP Baseline: no HEP Goal status: MET  2.  Pt will demo R 4th digit PIP ext of at least -15*.--Updated to less than or equal to -10* (01/27/22) Baseline:  -35* Goal status: MET  initial goal 01/27/22:  -15*  3.  Pt will report pain less than 2/10 consistently. Baseline:  0-6/10 Goal status: MET   01/27/22  4.  Pt will demo at R 4th digit DIP hyperext of 5* or less. Baseline: 15* hyperext  Goal status: IN PROGRESS  01/20/22 10* hyperext   5.  Pt improve Quick DASH to 10% or less.. Baseline: 22.7% Goal status: MET.  01/27/22  6.8%   ASSESSMENT:  CLINICAL IMPRESSION: Pt is progressing with improving ROM, less stiffness, and decr pain and edema.  PERFORMANCE DEFICITS in functional skills including ADLs, IADLs, edema, ROM, pain, flexibility, FMC, decreased knowledge of use of DME, and UE functional use.  IMPAIRMENTS are limiting patient from ADLs, IADLs, work, and leisure.   COMORBIDITIES may have co-morbidities  that affects occupational performance. Patient will benefit from skilled OT to address above  impairments and improve overall function.  MODIFICATION OR ASSISTANCE TO COMPLETE EVALUATION: No modification of tasks or assist necessary to complete an evaluation.  OT OCCUPATIONAL PROFILE AND HISTORY: Detailed assessment: Review of records and additional review of physical, cognitive, psychosocial history related to current functional performance.  CLINICAL DECISION MAKING: LOW - limited treatment options, no task modification necessary  REHAB POTENTIAL: Good  EVALUATION COMPLEXITY: Low   PLAN: OT FREQUENCY: 1-2x/week  OT DURATION: 8 weeks +eval (may benefit from 2x week initially, but pt requests 1x/wk due to work schedule)  PLANNED INTERVENTIONS: self care/ADL training, therapeutic exercise, therapeutic activity, neuromuscular re-education, manual therapy, scar mobilization, passive range of motion, splinting, electrical stimulation, ultrasound, paraffin, fluidotherapy, moist heat, cryotherapy, patient/family education, and  DME and/or AE instructions  RECOMMENDED OTHER SERVICES: none  CONSULTED AND AGREED WITH PLAN OF CARE: Patient  PLAN FOR NEXT SESSION:   paraffin; continue ROM, light strengthening, check remaining goals next session and anticipate d/c   Blake Vetrano, OTR/L 02/06/2022, 8:09 AM

## 2022-02-06 ENCOUNTER — Encounter: Payer: Self-pay | Admitting: Occupational Therapy

## 2022-02-06 ENCOUNTER — Encounter: Payer: Medicaid Other | Admitting: Medical

## 2022-02-06 ENCOUNTER — Ambulatory Visit: Payer: Medicaid Other | Admitting: Occupational Therapy

## 2022-02-06 DIAGNOSIS — M25541 Pain in joints of right hand: Secondary | ICD-10-CM

## 2022-02-06 DIAGNOSIS — M25641 Stiffness of right hand, not elsewhere classified: Secondary | ICD-10-CM

## 2022-02-06 DIAGNOSIS — R600 Localized edema: Secondary | ICD-10-CM

## 2022-02-06 NOTE — Progress Notes (Deleted)
P13 Flu Colon 21

## 2022-02-10 ENCOUNTER — Ambulatory Visit: Payer: Medicaid Other | Admitting: Occupational Therapy

## 2022-02-10 ENCOUNTER — Encounter: Payer: Self-pay | Admitting: Occupational Therapy

## 2022-02-10 DIAGNOSIS — M25641 Stiffness of right hand, not elsewhere classified: Secondary | ICD-10-CM

## 2022-02-10 DIAGNOSIS — M25541 Pain in joints of right hand: Secondary | ICD-10-CM

## 2022-02-10 NOTE — Therapy (Signed)
OUTPATIENT OCCUPATIONAL THERAPY ORTHO TREATMENT  Patient Name: Dustin Zamora MRN: [id number] DOB:1975/12/08, 46 y.o., male Today's Date: 02/10/2022  PCP: Chana Bode, PA-C REFERRING PROVIDER: Sherilyn Cooter, MD    OT End of Session - 02/10/22 [phone]     Visit Number 9    Number of Visits 9    Date for OT Re-Evaluation 02/21/22    Authorization Type [employer]--27 visit limit, no auth needed    OT Start Time 0802    OT Stop Time 0845    OT Time Calculation (min) 43 min    Activity Tolerance Patient tolerated treatment well    Behavior During Therapy [facility] for tasks assessed/performed                  Past Medical History:  Diagnosis Date   Anxiety    Chronic back pain    Depression    in remote past   Diabetes mellitus without complication ([place]) 6144   Hyperlipidemia 2008   Hypertension 2008   Keloid    Microalbuminuria    Smoker    Wears glasses    Past Surgical History:  Procedure Laterality Date   ANTERIOR CRUCIATE LIGAMENT REPAIR     right ACL repair, patellar repair, remove cartilage damage; 46yo   Patient Active Problem List   Diagnosis Date Noted   Contracture of joint of finger of right hand 12/03/2021   BMI 30.0-30.9,adult 12/31/2020   Former smoker 12/31/2020   Plantar wart 12/31/2020   Need for vaccination 05/09/2020   Low libido 05/09/2020   Aortic atherosclerosis ([place]) 05/09/2020   Constipation 10/20/2019   Gastroesophageal reflux disease 10/20/2019   Need for influenza vaccination 05/23/2019   Noncompliance 02/18/2019   History of pancreatitis 10/05/2018   History of alcohol abuse 10/05/2018   Uncontrolled type 2 diabetes mellitus with hyperglycemia ([place]) 08/25/2018   Vaccine counseling 08/25/2018   Microalbuminuria 08/07/2016   Special screening for malignant neoplasms, colon 08/05/2016   Hyperlipidemia 08/05/2016   Erectile dysfunction 08/05/2016   Generalized anxiety disorder 08/05/2016   Obesity 01/04/2015   Hypertension  associated with diabetes ([place]) 03/31/2007    ONSET DATE: approx 61month ago  REFERRING DIAG: Right ring finger PIP flexion contracture after injury   THERAPY DIAG:  Stiffness of right hand, not elsewhere classified  Pain in joint of right hand  Rationale for Evaluation and Treatment Rehabilitation  SUBJECTIVE:   SUBJECTIVE STATEMENT:  I'm wearing my splint but usually have to take it off in the middle of the night  PERTINENT HISTORY:   Pt had an injury 3 months ago in which he dropped a heavy object on the finger. He has not had any treatment for this so far.  Pt with approximately 35 degree flexion contracture of the ring finger. X-rays today show possible sequelae from a fracture of the ring finger middle phalangeal base but he has a concentric joint with well-maintained joint spaces and appears to have an intact central slip.   PMH also includes:  anxiety, depression, HTN, DM, hyperlipidemia, chronic back pain  PRECAUTIONS: None  PAIN:  Are you having pain? 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   Reviewed reverse blocking, passive PIP extension,  and blocking PIP in ext while working on DIP flexion.   Pt issued oval 8 to block DIP hyperextension (size 7 to wear for reverse blocking ex's, and size 8 to wear during the day). Pt instructed to continue to wear custom thermoplast splint at night to help w/ PIP ext   Assessed remaining goals         PATIENT EDUCATION: Education details: updated putty HEP to red--pt returned demo each. Person educated: pt Education method:  demo, Field seismologist. Education comprehension: returned [place]: 12/16/21:  initial HEP for ROM  12/24/21: splint wear  and care 01/20/22:  Yellow putty HEP 02/06/22:  updated HEP to red   GOALS: Potential Goals reviewed with patient? Yes  SHORT TERM GOALS: Target date: 01/20/2022    Pt will be independent with initial HEP. Baseline:  no HEP Goal status: MET 01/20/22  2.  Pt will demo R 4th digit PIP ext of at least -25*. Baseline:  -35* Goal status: MET   01/07/22:  -30* prior to exercise, -15* after exercise.  01/20/22:  15* prior to exercise today   3.  Pt will report pain less than or equal to 3/10 with HEP. Baseline:  up to 6/10 Goal status: MET.  01/07/22:  met.  4.  Pt will be independent with splint wear/care. Baseline:  pt does not have a splint Goal status: MET  01/20/22 able to wear splint all night last night   LONG TERM GOALS: Target date: 02/20/2022    Pt will be independent with updated HEP Baseline: no HEP Goal status: MET  2.  Pt will demo R 4th digit PIP ext of at least -15*.--Updated to less than or equal to -10* (01/27/22) Baseline:  -35* Goal status: MET  initial goal 01/27/22:  -15*, 02/10/22: -15*  3.  Pt will report pain less than 2/10 consistently. Baseline:  0-6/10 Goal status: MET   01/27/22  4.  Pt will demo at R 4th digit DIP hyperext of 5* or less. Baseline: 15* hyperext  Goal status: NOT MET  02/10/22 10* hyperext   5.  Pt improve Quick DASH to 10% or less.. Baseline: 22.7% Goal status: MET.  01/27/22  6.8%   ASSESSMENT:  CLINICAL IMPRESSION: Pt has met most goals at this time. Pt continues to have -15* PIP extension lag and 10* hyperextension at DIP joint.   PERFORMANCE DEFICITS in functional skills including ADLs, IADLs, edema, ROM, pain, flexibility, FMC, decreased knowledge of use of DME, and UE functional use.  IMPAIRMENTS are limiting patient from ADLs, IADLs, work, and leisure.   COMORBIDITIES may have co-morbidities  that affects occupational performance. Patient will benefit from skilled OT to address above impairments and improve overall  function.  MODIFICATION OR ASSISTANCE TO COMPLETE EVALUATION: No modification of tasks or assist necessary to complete an evaluation.  OT OCCUPATIONAL PROFILE AND HISTORY: Detailed assessment: Review of records and additional review of physical, cognitive, psychosocial history related to current functional performance.  CLINICAL DECISION MAKING: LOW - limited treatment options, no task modification necessary  REHAB POTENTIAL: Good  EVALUATION COMPLEXITY: Low   PLAN: OT FREQUENCY: 1-2x/week  OT DURATION: 8 weeks +eval (may benefit from 2x week initially, but pt requests 1x/wk due to work schedule)  PLANNED INTERVENTIONS: self care/ADL training, therapeutic exercise, therapeutic activity, neuromuscular re-education, manual therapy, scar mobilization, passive range of motion, splinting, electrical stimulation, ultrasound, paraffin, fluidotherapy, moist heat, cryotherapy, patient/family education, and DME and/or AE instructions  RECOMMENDED OTHER SERVICES:  none  CONSULTED AND AGREED WITH PLAN OF CARE: Patient  PLAN D/C OT   OCCUPATIONAL THERAPY DISCHARGE SUMMARY  Visits from Start of Care: 9  Current functional level related to goals / functional outcomes: SEE ABOVE   Remaining deficits: Rt 4th finger PIP extensor lag (-15*) and DIP hyperextension   Education / Equipment: HEP's, splint wear and care   Patient agrees to discharge. Patient goals were partially met. Patient is being discharged due to maximized rehab potential. .      Hans Eden, OTR/L 02/10/2022, 8:13 AM

## 2022-02-13 ENCOUNTER — Other Ambulatory Visit: Payer: Self-pay | Admitting: Medical

## 2022-02-19 NOTE — Telephone Encounter (Signed)
P.A. Approved til 02/01/23

## 2022-02-24 ENCOUNTER — Other Ambulatory Visit: Payer: Self-pay | Admitting: Gastroenterology

## 2022-02-24 ENCOUNTER — Other Ambulatory Visit: Payer: Self-pay | Admitting: Medical

## 2022-02-25 NOTE — Telephone Encounter (Signed)
Refill for wellbutrin last apt 11/06/21 next apt 04/08/22.

## 2022-03-19 ENCOUNTER — Encounter: Payer: Self-pay | Admitting: Internal Medicine

## 2022-04-08 ENCOUNTER — Encounter: Payer: Self-pay | Admitting: Medical

## 2022-04-08 ENCOUNTER — Ambulatory Visit (INDEPENDENT_AMBULATORY_CARE_PROVIDER_SITE_OTHER): Payer: Medicaid Other | Admitting: Medical

## 2022-04-08 VITALS — BP 124/82 | HR 94 | Temp 98.4°F | Ht 73.5 in | Wt 224.4 lb

## 2022-04-08 DIAGNOSIS — Z23 Encounter for immunization: Secondary | ICD-10-CM | POA: Diagnosis not present

## 2022-04-08 DIAGNOSIS — E1165 Type 2 diabetes mellitus with hyperglycemia: Secondary | ICD-10-CM | POA: Diagnosis not present

## 2022-04-08 DIAGNOSIS — R21 Rash and other nonspecific skin eruption: Secondary | ICD-10-CM | POA: Diagnosis not present

## 2022-04-08 DIAGNOSIS — E1159 Type 2 diabetes mellitus with other circulatory complications: Secondary | ICD-10-CM

## 2022-04-08 DIAGNOSIS — Z125 Encounter for screening for malignant neoplasm of prostate: Secondary | ICD-10-CM | POA: Diagnosis not present

## 2022-04-08 DIAGNOSIS — Z0000 Encounter for general adult medical examination without abnormal findings: Secondary | ICD-10-CM | POA: Diagnosis not present

## 2022-04-08 DIAGNOSIS — R0683 Snoring: Secondary | ICD-10-CM | POA: Diagnosis not present

## 2022-04-08 DIAGNOSIS — E785 Hyperlipidemia, unspecified: Secondary | ICD-10-CM

## 2022-04-08 DIAGNOSIS — I152 Hypertension secondary to endocrine disorders: Secondary | ICD-10-CM | POA: Diagnosis not present

## 2022-04-08 DIAGNOSIS — R400 Somnolence: Secondary | ICD-10-CM | POA: Diagnosis not present

## 2022-04-08 DIAGNOSIS — R809 Proteinuria, unspecified: Secondary | ICD-10-CM

## 2022-04-08 DIAGNOSIS — I700 Atherosclerosis of aorta: Secondary | ICD-10-CM

## 2022-04-08 DIAGNOSIS — R5383 Other fatigue: Secondary | ICD-10-CM | POA: Diagnosis not present

## 2022-04-08 LAB — POCT URINALYSIS DIP (CLINITEK)
Bilirubin, UA: NEGATIVE
Blood, UA: NEGATIVE
Glucose, UA: NEGATIVE mg/dL
Ketones, POC UA: NEGATIVE mg/dL
Leukocytes, UA: NEGATIVE
Nitrite, UA: NEGATIVE
Spec Grav, UA: 1.02 (ref 1.010–1.025)
Urobilinogen, UA: 0.2 E.U./dL
pH, UA: 6 (ref 5.0–8.0)

## 2022-04-08 MED ORDER — TRIAMCINOLONE ACETONIDE 0.1 % EX CREA: 1.0000 | TOPICAL_CREAM | Freq: Two times a day (BID) | CUTANEOUS | 0 refills | Status: DC

## 2022-04-08 NOTE — Progress Notes (Signed)
Subjective: Chief Complaint  Patient presents with   Annual Exam    Cpe red rash on stomach, pubic area, chest and thigh, for today    Medical team: Eye doctor  Dentist Dr. Thornton Park, GI Dr. Sherilyn Cooter, ortho Dr. Eleonore Chiquito, cardiology Adrienne Trombetta, Camelia Eng, PA-C here for primary care   Concerns:  Here for a well visit.  He notes that he has not taken some of his medications.  He ran out of Ozempic and the pharmacy told him we would not refill without an appointment and that was months ago.  He also said because he could not get that medicine he got upset and he has not been taking but 1/2 tablet of Benicar not taking Wellbutrin as he is afraid he cannot get it from the pharmacy  He has a rash that just started today on his abdomen and pubic region.  He has been handing some different chemicals recently.  No other new issues  No concern for STD  Reviewed their medical, surgical, family, social, medication, and allergy history and updated chart as appropriate.  Past Medical History:  Diagnosis Date   Anxiety    Chronic back pain    Depression    in remote past   Diabetes mellitus without complication ([place]) 3291   Hyperlipidemia 2008   Hypertension 2008   Keloid    Microalbuminuria    Smoker    Wears glasses     Past Surgical History:  Procedure Laterality Date   ANTERIOR CRUCIATE LIGAMENT REPAIR     right ACL repair, patellar repair, remove cartilage damage; 46yo    Social History   Socioeconomic History   Marital status: Married    Spouse name: Not on file   Number of children: Not on file   Years of education: Not on file   Highest education level: Not on file  Occupational History   Not on file  Tobacco Use   Smoking status: Former    Packs/day: 0.50    Years: 25.00    Total pack years: 12.50    Types: Cigars, Cigarettes   Smokeless tobacco: Never  Vaping Use   Vaping Use: Never used  Substance and Sexual Activity   Alcohol use: Not  Currently    Alcohol/week: 4.0 standard drinks of alcohol    Types: 4 Shots of liquor per week   Drug use: Yes    Frequency: 7.0 times per week    Types: Marijuana    Comment: last used 12/04/19   Sexual activity: Not on file  Other Topics Concern   Not on file  Social History Narrative   Exercises with cardio, some weights.  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  Working in Scientist, research (life sciences) estate currently.   03/2022   Social Determinants of Health   Financial Resource Strain: Not on file  Food Insecurity: Not on file  Transportation Needs: Not on file  Physical Activity: Not on file  Stress: Not on file  Social Connections: Not on file  Intimate Partner Violence: Not on file    Family History  Problem Relation Age of Onset   Kidney disease Father    Vascular Disease Father    Stroke Paternal Grandmother    Diabetes Paternal Grandmother    Cancer Neg Hx    Heart disease Neg Hx    Colon cancer Neg Hx    Rectal cancer Neg Hx    Stomach cancer Neg Hx      Current Outpatient Medications:  blood glucose meter kit and supplies, Dispense based on patient and insurance preference. Test 1-2 times a day, Disp: 1 each, Rfl: 0   Insulin Pen Needle (BD PEN NEEDLE NANO U/F) 32G X 4 MM MISC, 1 each by Does not apply route at bedtime., Disp: 100 each, Rfl: 11   metFORMIN (GLUCOPHAGE) 850 MG tablet, TAKE 1 TABLET (850 MG TOTAL) BY MOUTH 2 (TWO) TIMES DAILY WITH A MEAL., Disp: 180 tablet, Rfl: 0   olmesartan (BENICAR) 5 MG tablet, TAKE 1 TABLET (5 MG TOTAL) BY MOUTH DAILY., Disp: 90 tablet, Rfl: 0   omeprazole (PRILOSEC) 40 MG capsule, TAKE 1 CAPSULE (40 MG TOTAL) BY MOUTH DAILY. PT NEEDS OFFICE VISIT., Disp: 90 capsule, Rfl: 1   Semaglutide, 1 MG/DOSE, (OZEMPIC, 1 MG/DOSE,) 4 MG/3ML SOPN, Inject 1 mg into the skin once a week., Disp: 3 mL, Rfl: 5   sildenafil (VIAGRA) 100 MG tablet, TAKE ONE TABLET BY MOUTH DAILY AS NEEDED, Disp: 30 tablet, Rfl: 0   triamcinolone cream (KENALOG) 0.1 %, Apply  1 Application topically 2 (two) times daily., Disp: 30 g, Rfl: 0   buPROPion (WELLBUTRIN XL) 150 MG 24 hr tablet, TAKE 1 TABLET BY MOUTH EVERY DAY (Patient not taking: Reported on 04/08/2022), Disp: 90 tablet, Rfl: 0   dapagliflozin propanediol (FARXIGA) 10 MG TABS tablet, Take 1 tablet (10 mg total) by mouth daily before breakfast. (Patient not taking: Reported on 04/08/2022), Disp: 90 tablet, Rfl: 3   rosuvastatin (CRESTOR) 20 MG tablet, TAKE 1 TABLET BY MOUTH EVERYDAY AT BEDTIME (Patient not taking: Reported on 04/08/2022), Disp: 90 tablet, Rfl: 0  Allergies  Allergen Reactions   Penicillins Anaphylaxis    Has patient had a PCN reaction causing immediate rash, facial/tongue/throat swelling, SOB or lightheadedness with hypotension: Unknown Has patient had a PCN reaction causing severe rash involving mucus membranes or skin necrosis: Unknown Has patient had a PCN reaction that required hospitalization: Unknown Has patient had a PCN reaction occurring within the last 10 years: Unknown If all of the above answers are "NO", then may proceed with Cephalosporin use.     Review of Systems Constitutional: -fever, -chills, -sweats, -unexpected weight change, -decreased appetite, +fatigue Allergy: -sneezing, -itching, -congestion Dermatology: -changing moles, +rash, -lumps ENT: -runny nose, -ear pain, -sore throat, -hoarseness, -sinus pain, -teeth pain, - ringing in ears, -hearing loss, -nosebleeds Cardiology: -chest pain, -palpitations, -swelling, -difficulty breathing when lying flat, -waking up short of breath Respiratory: -cough, -shortness of breath, -difficulty breathing with exercise or exertion, -wheezing, -coughing up blood Gastroenterology: -abdominal pain, -nausea, -vomiting, -diarrhea, -constipation, -blood in stool, -changes in bowel movement, -difficulty swallowing or eating Hematology: -bleeding, -bruising  Musculoskeletal: -joint aches, -muscle aches, -joint swelling, -back pain, -neck  pain, -cramping, -changes in gait Ophthalmology: denies vision changes, eye redness, itching, discharge Urology: -burning with urination, -difficulty urinating, -blood in urine, -urinary frequency, -urgency, -incontinence Neurology: -headache, -weakness, -tingling, -numbness, -memory loss, -falls, -dizziness Psychology: -depressed mood, -agitation, +sleep problems Male GU: no testicular mass, pain, no lymph nodes swollen, no swelling, no rash.       Objective:  BP 124/82   Pulse 94   Temp 98.4 F (36.9 C)   Ht 6' 1.5" (1.867 m)   Wt 224 lb 6.4 oz (101.8 kg)   BMI 29.20 kg/m   Wt Readings from Last 3 Encounters:  04/08/22 224 lb 6.4 oz (101.8 kg)  11/06/21 213 lb 9.6 oz (96.9 kg)  09/02/21 216 lb 3.2 oz (98.1 kg)   BP Readings from  Last 3 Encounters:  04/08/22 124/82  11/06/21 120/74  09/02/21 120/78    General appearance: alert, no distress, WD/WN, African American male Skin: keloid scar middle of chest, raised 3cm diameter, urticalrial patches of rash 3cm diamter or less on right lower abdomne, right upper inner thigh, central pubic region, and left upper chest, othewise no other  worrisome lesions Neck: supple, no lymphadenopathy, no thyromegaly, no masses, normal ROM, no bruits Chest: non tender, normal shape and expansion Heart: RRR, normal S1, S2, no murmurs Lungs: CTA bilaterally, no wheezes, rhonchi, or rales Abdomen: +bs, soft, non tender, non distended, no masses, no hepatomegaly, no splenomegaly, no bruits Back: non tender, normal ROM, no scoliosis Musculoskeletal: +right anterior knee surgical scar, upper extremities non tender, no obvious deformity, normal ROM throughout, lower extremities non tender, no obvious deformity, normal ROM throughout Extremities: no edema, no cyanosis, no clubbing Pulses: 2+ symmetric, upper and lower extremities, normal cap refill Neurological: alert, oriented x 3, CN2-12 intact, strength normal upper extremities and lower  extremities, sensation normal throughout, DTRs 2+ throughout, no cerebellar signs, gait normal Psychiatric: normal affect, behavior normal, pleasant  GU: normal male external genitalia,circumcised, nontender, no masses, no hernia, no lymphadenopathy Rectal: deferred   Diabetic Foot Exam - Simple   Simple Foot Form Diabetic Foot exam was performed with the following findings: Yes 04/08/2022  4:03 PM  Visual Inspection No deformities, no ulcerations, no other skin breakdown bilaterally: Yes Sensation Testing Intact to touch and monofilament testing bilaterally: Yes Pulse Check Posterior Tibialis and Dorsalis pulse intact bilaterally: Yes Comments     Assessment and Plan :   Encounter Diagnoses  Name Primary?   Encounter for health maintenance examination in adult Yes   Physical exam, annual    Hyperlipidemia, unspecified hyperlipidemia type    Hypertension associated with diabetes ([place])    Aortic atherosclerosis ([place])    Microalbuminuria    Uncontrolled type 2 diabetes mellitus with hyperglycemia ([place])    Other fatigue    Screening for prostate cancer    Need for influenza vaccination    Rash    Snoring    Daytime somnolence     Physical exam - discussed and counseled on healthy lifestyle, diet, exercise, preventative care, vaccinations, sick and well care, proper use of emergency dept and after hours care, and addressed their concerns.    Health screening: See your eye doctor yearly for routine vision care. See your dentist yearly for routine dental care including hygiene visits twice yearly.  Cancer screening Advised monthly self testicular exam  Colonoscopy: advised now if insurance will cover, given new guidelines.  Discussed PSA, prostate exam, and prostate cancer screening risks/benefits.      Vaccinations: Advised yearly influenza vaccine Advised covid vaccine  HTN - c/t current medicaiton  hyperlipidemia - c/t current medicaiton   Uncontrolled diabetes  - advised he not start other medicaiton or use other peoples medicaiton without consulting with [place] first.  This could be quite dangerous.  Advised he not run out of medicaiton and have pharmacy call with refills request.   C/t glucose monitoring.    Labs as below      This visit was a preventative care visit, also known as wellness visit or routine physical.   Topics typically include healthy lifestyle, diet, exercise, preventative care, vaccinations, sick and well care, proper use of emergency dept and after hours care, as well as other concerns.     Recommendations: Continue to return yearly for your annual wellness and  preventative care visits.  This gives [place] a chance to discuss healthy lifestyle, exercise, vaccinations, review your chart record, and perform screenings where appropriate.  I recommend you see your eye doctor yearly for routine vision care.  I recommend you see your dentist yearly for routine dental care including hygiene visits twice yearly.   Vaccination recommendations were reviewed Immunization History  Administered Date(s) Administered   H1N1 08/04/2008   Influenza Whole 07/24/2005, 07/24/2006, 06/15/2007   Influenza,inj,Quad PF,6+ Mos 05/12/2017, 05/09/2020, 04/02/2021, 04/08/2022   PFIZER Comirnaty(Gray Top)Covid-19 Tri-Sucrose Vaccine 09/09/2019, 10/07/2019   PFIZER(Purple Top)SARS-COV-2 Vaccination 05/09/2020   Pneumococcal Polysaccharide-23 07/24/2006   Td 10/12/2005   Tdap 01/04/2015    Counseled on the influenza virus vaccine.  Vaccine information sheet given.  Influenza vaccine given after consent obtained.    Screening for cancer: Colon cancer screening: I reviewed your colonoscopy on file that is up to date from 2021  We discussed PSA, prostate exam, and prostate cancer screening risks/benefits.     Skin cancer screening: Check your skin regularly for new changes, growing lesions, or other lesions of concern Come in for evaluation if you  have skin lesions of concern.  Lung cancer screening: If you have a greater than 20 pack year history of tobacco use, then you may qualify for lung cancer screening with a chest CT scan.   Please call your insurance company to inquire about coverage for this test.  We currently don't have screenings for other cancers besides breast, cervical, colon, and lung cancers.  If you have a strong family history of cancer or have other cancer screening concerns, please let me know.    Bone health: Get at least 150 minutes of aerobic exercise weekly Get weight bearing exercise at least once weekly Bone density test:  A bone density test is an imaging test that uses a type of X-ray to measure the amount of calcium and other minerals in your bones. The test may be used to diagnose or screen you for a condition that causes weak or thin bones (osteoporosis), predict your risk for a broken bone (fracture), or determine how well your osteoporosis treatment is working. The bone density test is recommended for females 9 and older, or females or males <16 if certain risk factors such as thyroid disease, long term use of steroids such as for asthma or rheumatological issues, vitamin D deficiency, estrogen deficiency, family history of osteoporosis, self or family history of fragility fracture in first degree relative.    Heart health: Get at least 150 minutes of aerobic exercise weekly Limit alcohol It is important to maintain a healthy blood pressure and healthy cholesterol numbers  Heart disease screening: Screening for heart disease includes screening for blood pressure, fasting lipids, glucose/diabetes screening, BMI height to weight ratio, reviewed of smoking status, physical activity, and diet.    Goals include blood pressure 120/80 or less, maintaining a healthy lipid/cholesterol profile, preventing diabetes or keeping diabetes numbers under good control, not smoking or using tobacco products,  exercising most days per week or at least 150 minutes per week of exercise, and eating healthy variety of fruits and vegetables, healthy oils, and avoiding unhealthy food choices like fried food, fast food, high sugar and high cholesterol foods.    Other tests may possibly include EKG test, CT coronary calcium score, echocardiogram, exercise treadmill stress test.    Medical care options: I recommend you continue to seek care here first for routine care.  We try really hard to have  available appointments Monday through Friday daytime hours for sick visits, acute visits, and physicals.  Urgent care should be used for after hours and weekends for significant issues that cannot wait till the next day.  The emergency department should be used for significant potentially life-threatening emergencies.  The emergency department is expensive, can often have long wait times for less significant concerns, so try to utilize primary care, urgent care, or telemedicine when possible to avoid unnecessary trips to the emergency department.  Virtual visits and telemedicine have been introduced since the pandemic started in 2020, and can be convenient ways to receive medical care.  We offer virtual appointments as well to assist you in a variety of options to seek medical care.    Separate significant issues discussed: Diabetes-updated labs today.  He is compliant with metformin but has been out of Ozempic for months.  There apparently was some confusion between our office and the pharmacy.  He was unable to get [place] either due to insurance denial so he has only been using metformin  Hyperlipidemia-he is not compliant with Crestor.  He decided to stop this on his own  Hypertension-compliant with olmesartan some of the time  Fatigue-if normal labs in regards to thyroid testosterone and no other obvious cause of fatigue, we will pursue sleep study  Rash-likely allergic to some chemical he was run yesterday.   Continue Benadryl 2 days, steroid cream as below topically for the next 2 days, avoidance of triggers  Given prior findings of atherosclerosis-reiterated the need to be on statin Crestor daily  We discussed compliance in general.  Advise anytime in the future he has an issue with getting medications to call or send me a MyChart message.  Not sure where the issue occurred from earlier in the year to now regarding the [place].   Dustin Zamora was seen today for annual exam.  Diagnoses and all orders for this visit:  Encounter for health maintenance examination in adult -     Hemoglobin A1c -     Lipid panel -     Microalbumin/Creatinine Ratio, Urine -     Comprehensive metabolic panel -     CBC -     PSA -     Testosterone -     TSH -     POCT Urinalysis DIP (Proadvantage Device) -     HIV Antibody (routine testing w rflx) -     Hepatitis C antibody  Physical exam, annual -     POCT URINALYSIS DIP (CLINITEK)  Hyperlipidemia, unspecified hyperlipidemia type -     Lipid panel  Hypertension associated with diabetes ([place])  Aortic atherosclerosis ([facility]) -     Lipid panel  Microalbuminuria -     Microalbumin/Creatinine Ratio, Urine  Uncontrolled type 2 diabetes mellitus with hyperglycemia ([facility]) -     Hemoglobin A1c  Other fatigue  Screening for prostate cancer -     PSA  Need for influenza vaccination -     Flu Vaccine QUAD 6+ mos PF IM (Fluarix Quad PF)  Rash  Snoring  Daytime somnolence  Other orders -     triamcinolone cream (KENALOG) 0.1 %; Apply 1 Application topically 2 (two) times daily.     Follow-up pending labs, yearly for physical

## 2022-04-09 ENCOUNTER — Other Ambulatory Visit: Payer: Self-pay | Admitting: Medical

## 2022-04-09 DIAGNOSIS — N529 Male erectile dysfunction, unspecified: Secondary | ICD-10-CM

## 2022-04-09 LAB — LIPID PANEL
Chol/HDL Ratio: 3.8 ratio (ref 0.0–5.0)
Cholesterol, Total: 200 mg/dL — ABNORMAL HIGH (ref 100–199)
HDL: 53 mg/dL (ref >39)
LDL Chol Calc (NIH): 123 mg/dL — ABNORMAL HIGH (ref 0–99)
Triglycerides: 138 mg/dL (ref 0–149)
VLDL Cholesterol Cal: 24 mg/dL (ref 5–40)

## 2022-04-09 LAB — COMPREHENSIVE METABOLIC PANEL
ALT: 18 IU/L (ref 0–44)
AST: 13 IU/L (ref 0–40)
Albumin/Globulin Ratio: 1.8 (ref 1.2–2.2)
Albumin: 4.6 g/dL (ref 4.1–5.1)
Alkaline Phosphatase: 67 IU/L (ref 44–121)
BUN/Creatinine Ratio: 15 (ref 9–20)
BUN: 18 mg/dL (ref 6–24)
Bilirubin Total: <0.2 mg/dL (ref 0.0–1.2)
CO2: 24 mmol/L (ref 20–29)
Calcium: 10 mg/dL (ref 8.7–10.2)
Chloride: 104 mmol/L (ref 96–106)
Creatinine, Ser: 1.17 mg/dL (ref 0.76–1.27)
Globulin, Total: 2.5 g/dL (ref 1.5–4.5)
Glucose: 125 mg/dL — ABNORMAL HIGH (ref 70–99)
Potassium: 4.5 mmol/L (ref 3.5–5.2)
Sodium: 141 mmol/L (ref 134–144)
Total Protein: 7.1 g/dL (ref 6.0–8.5)
eGFR: 77 mL/min/{1.73_m2} (ref >59)

## 2022-04-09 LAB — CBC
Hematocrit: 42.4 % (ref 37.5–51.0)
Hemoglobin: 14.7 g/dL (ref 13.0–17.7)
MCH: 32.4 pg (ref 26.6–33.0)
MCHC: 34.7 g/dL (ref 31.5–35.7)
MCV: 93 fL (ref 79–97)
Platelets: 288 10*3/uL (ref 150–450)
RBC: 4.54 x10E6/uL (ref 4.14–5.80)
RDW: 12.5 % (ref 11.6–15.4)
WBC: 7.1 10*3/uL (ref 3.4–10.8)

## 2022-04-09 LAB — TESTOSTERONE: Testosterone: 542 ng/dL (ref 264–916)

## 2022-04-09 LAB — MICROALBUMIN / CREATININE URINE RATIO
Creatinine, Urine: 162.7 mg/dL
Microalb/Creat Ratio: 46 mg/g creat — ABNORMAL HIGH (ref 0–29)
Microalbumin, Urine: 75.4 ug/mL

## 2022-04-09 LAB — HIV ANTIBODY (ROUTINE TESTING W REFLEX): HIV Screen 4th Generation wRfx: NONREACTIVE

## 2022-04-09 LAB — PSA: Prostate Specific Ag, Serum: 0.5 ng/mL (ref 0.0–4.0)

## 2022-04-09 LAB — HEMOGLOBIN A1C
Est. average glucose Bld gHb Est-mCnc: 200 mg/dL
Hgb A1c MFr Bld: 8.6 % — ABNORMAL HIGH (ref 4.8–5.6)

## 2022-04-09 LAB — TSH: TSH: 4.15 u[IU]/mL (ref 0.450–4.500)

## 2022-04-09 MED ORDER — SILDENAFIL CITRATE 100 MG PO TABS: 100.0000 mg | ORAL_TABLET | Freq: Every day | ORAL | 5 refills | Status: DC | PRN

## 2022-04-09 MED ORDER — BUPROPION HCL ER (XL) 150 MG PO TB24: 150.0000 mg | ORAL_TABLET | Freq: Every day | ORAL | 3 refills | Status: DC

## 2022-04-09 MED ORDER — OLMESARTAN MEDOXOMIL 5 MG PO TABS: 5.0000 mg | ORAL_TABLET | Freq: Every day | ORAL | 3 refills | Status: DC

## 2022-04-09 MED ORDER — METFORMIN HCL 850 MG PO TABS: 850.0000 mg | ORAL_TABLET | Freq: Two times a day (BID) | ORAL | 3 refills | Status: DC

## 2022-04-09 MED ORDER — ROSUVASTATIN CALCIUM 20 MG PO TABS: 20.0000 mg | ORAL_TABLET | Freq: Every day | ORAL | 3 refills | Status: DC

## 2022-04-09 MED ORDER — OZEMPIC (1 MG/DOSE) 4 MG/3ML ~~LOC~~ SOPN: 1.0000 mg | PEN_INJECTOR | SUBCUTANEOUS | 5 refills | Status: DC

## 2022-04-10 LAB — SPECIMEN STATUS REPORT

## 2022-04-10 LAB — HEPATITIS C ANTIBODY: Hep C Virus Ab: NONREACTIVE

## 2022-04-11 ENCOUNTER — Other Ambulatory Visit: Payer: Self-pay | Admitting: Gastroenterology

## 2022-04-15 ENCOUNTER — Telehealth: Payer: Self-pay | Admitting: Medical

## 2022-04-15 NOTE — Telephone Encounter (Signed)
Pt was notified of results. Pt states he can not get his mounjaro as its out of stock. He wants to know is there something else he can try. He is willing to do the metformin

## 2022-04-15 NOTE — Telephone Encounter (Signed)
Please call, he wants to discuss lab results

## 2022-04-16 ENCOUNTER — Other Ambulatory Visit: Payer: Self-pay | Admitting: Medical

## 2022-04-16 MED ORDER — MOUNJARO 7.5 MG/0.5ML ~~LOC~~ SOAJ: 7.5000 mg | SUBCUTANEOUS | 1 refills | Status: DC

## 2022-04-16 NOTE — Telephone Encounter (Signed)
Pt would like mounajaro sent in. Pt is currently out of ozempic.   Pt would also like something testosterone- gel, injections, pills. He said he would pay out of pocket if not covered as he feels he needs something

## 2022-04-16 NOTE — Telephone Encounter (Signed)
Left message for pt to call me back 

## 2022-04-22 ENCOUNTER — Encounter: Payer: Self-pay | Admitting: Internal Medicine

## 2022-05-07 ENCOUNTER — Encounter: Payer: Self-pay | Admitting: Medical

## 2022-05-11 ENCOUNTER — Other Ambulatory Visit: Payer: Self-pay | Admitting: Gastroenterology

## 2022-06-03 ENCOUNTER — Encounter: Payer: Self-pay | Admitting: Family Medicine

## 2022-06-03 ENCOUNTER — Ambulatory Visit (INDEPENDENT_AMBULATORY_CARE_PROVIDER_SITE_OTHER): Payer: Medicaid Other | Admitting: Family Medicine

## 2022-06-03 VITALS — BP 132/78 | HR 80 | Temp 97.7°F | Ht 73.5 in | Wt 210.0 lb

## 2022-06-03 DIAGNOSIS — R110 Nausea: Secondary | ICD-10-CM | POA: Diagnosis not present

## 2022-06-03 DIAGNOSIS — E1165 Type 2 diabetes mellitus with hyperglycemia: Secondary | ICD-10-CM

## 2022-06-03 DIAGNOSIS — R1013 Epigastric pain: Secondary | ICD-10-CM | POA: Diagnosis not present

## 2022-06-03 NOTE — Progress Notes (Signed)
   Subjective:    Patient ID: Dustin Zamora, male    DOB: April 12, 1976, 46 y.o.   MRN: [id number]  HPI He is here for an acute visit because of a 10-day history of difficulty with vomiting and abdominal pain as well as increased difficulty with eructation.  He states that any food can cause difficulty and he is unable to keep it down.  He has a previous history of pancreatitis over 8 years ago and has not had any alcohol consumption since then because of this.  He does smoke marijuana.  He has been taking Prilosec 40 mg regularly without any success from this.  He has diabetes and started taking Ozempic Wednesday however the symptoms actually started before that.  When he was on this prior to that he had no difficulty with abdominal distress.  Review of the record indicates he had been seen by gastroenterology in 2021 and CT scan of the abdomen showed no obvious pathology but did show evidence of aortic atherosclerosis.  He does have a history of diabetes with his most recent hemoglobin A1c of 8.6.  He apparently is already set up an appointment with gastroenterology for December 15 and states that he needs a note to stay out of work until then because of difficulty from the abdominal pain interfering with work.   Review of Systems     Objective:   Physical Exam Alert and in no distress. Tympanic membranes and canals are normal. Pharyngeal area is normal. Neck is supple without adenopathy or thyromegaly. Cardiac exam shows a regular sinus rhythm without murmurs or gallops. Lungs are clear to auscultation.  Abdominal exam shows decreased bowel sounds with some mid epigastric tenderness but no rebound.        Assessment & Plan:  Epigastric pain - Plan: CBC with Differential/Platelet, Comprehensive metabolic panel, Amylase, Lipase  Uncontrolled type 2 diabetes mellitus with hyperglycemia ([facility])  Nausea Since he has a previous history of pancreatitis, I will follow-up with this.  He has multiple  factors that could be contributing to his abdominal pain however since he has had a previous work-up based on this.  Explained that I cannot give him a note for being out of work for the next 2 weeks based just solely on his complaint of not being able to work.  I did give him a note for today and we will go further after I have a chance to look at his blood work.

## 2022-06-04 LAB — COMPREHENSIVE METABOLIC PANEL
ALT: 19 IU/L (ref 0–44)
AST: 16 IU/L (ref 0–40)
Albumin/Globulin Ratio: 1.7 (ref 1.2–2.2)
Albumin: 4.8 g/dL (ref 4.1–5.1)
Alkaline Phosphatase: 74 IU/L (ref 44–121)
BUN/Creatinine Ratio: 18 (ref 9–20)
BUN: 24 mg/dL (ref 6–24)
Bilirubin Total: 0.6 mg/dL (ref 0.0–1.2)
CO2: 24 mmol/L (ref 20–29)
Calcium: 9.8 mg/dL (ref 8.7–10.2)
Chloride: 93 mmol/L — ABNORMAL LOW (ref 96–106)
Creatinine, Ser: 1.31 mg/dL — ABNORMAL HIGH (ref 0.76–1.27)
Globulin, Total: 2.8 g/dL (ref 1.5–4.5)
Glucose: 203 mg/dL — ABNORMAL HIGH (ref 70–99)
Potassium: 5.1 mmol/L (ref 3.5–5.2)
Sodium: 135 mmol/L (ref 134–144)
Total Protein: 7.6 g/dL (ref 6.0–8.5)
eGFR: 67 mL/min/{1.73_m2} (ref >59)

## 2022-06-04 LAB — CBC WITH DIFFERENTIAL/PLATELET
Basophils Absolute: 0 10*3/uL (ref 0.0–0.2)
Basos: 0 %
EOS (ABSOLUTE): 0.1 10*3/uL (ref 0.0–0.4)
Eos: 1 %
Hematocrit: 47 % (ref 37.5–51.0)
Hemoglobin: 16.1 g/dL (ref 13.0–17.7)
Immature Grans (Abs): 0 10*3/uL (ref 0.0–0.1)
Immature Granulocytes: 0 %
Lymphocytes Absolute: 1.7 10*3/uL (ref 0.7–3.1)
Lymphs: 32 %
MCH: 32.2 pg (ref 26.6–33.0)
MCHC: 34.3 g/dL (ref 31.5–35.7)
MCV: 94 fL (ref 79–97)
Monocytes Absolute: 0.4 10*3/uL (ref 0.1–0.9)
Monocytes: 8 %
Neutrophils Absolute: 3 10*3/uL (ref 1.4–7.0)
Neutrophils: 59 %
Platelets: 316 10*3/uL (ref 150–450)
RBC: 5 x10E6/uL (ref 4.14–5.80)
RDW: 12.1 % (ref 11.6–15.4)
WBC: 5.1 10*3/uL (ref 3.4–10.8)

## 2022-06-04 LAB — AMYLASE: Amylase: 132 U/L — ABNORMAL HIGH (ref 31–110)

## 2022-06-04 LAB — LIPASE: Lipase: 36 U/L (ref 13–78)

## 2022-06-10 ENCOUNTER — Encounter: Payer: Self-pay | Admitting: Medical

## 2022-06-10 ENCOUNTER — Ambulatory Visit: Payer: Medicaid Other | Admitting: Medical

## 2022-06-10 VITALS — BP 130/80 | HR 105 | Wt 208.2 lb

## 2022-06-10 DIAGNOSIS — M25512 Pain in left shoulder: Secondary | ICD-10-CM | POA: Diagnosis not present

## 2022-06-10 DIAGNOSIS — K219 Gastro-esophageal reflux disease without esophagitis: Secondary | ICD-10-CM

## 2022-06-10 DIAGNOSIS — R1013 Epigastric pain: Secondary | ICD-10-CM

## 2022-06-10 DIAGNOSIS — Z79899 Other long term (current) drug therapy: Secondary | ICD-10-CM | POA: Diagnosis not present

## 2022-06-10 DIAGNOSIS — G8929 Other chronic pain: Secondary | ICD-10-CM

## 2022-06-10 DIAGNOSIS — R748 Abnormal levels of other serum enzymes: Secondary | ICD-10-CM | POA: Diagnosis not present

## 2022-06-10 DIAGNOSIS — E1165 Type 2 diabetes mellitus with hyperglycemia: Secondary | ICD-10-CM | POA: Diagnosis not present

## 2022-06-10 MED ORDER — DICYCLOMINE HCL 10 MG PO CAPS: 10.0000 mg | ORAL_CAPSULE | Freq: Four times a day (QID) | ORAL | 0 refills | Status: DC

## 2022-06-10 MED ORDER — EMPAGLIFLOZIN 10 MG PO TABS: 10.0000 mg | ORAL_TABLET | Freq: Every day | ORAL | 1 refills | Status: DC

## 2022-06-10 NOTE — Progress Notes (Signed)
Subjective: Chief Complaint  Patient presents with   Follow-up    Follow-up on pancreaitis. Still having pain and still having some epidsodes of vomitting, having a lot of shoulder pain- feeling weakness as he's not able to eat alot   Here for f/u on pancreatitis.   Somewhat improved, but after thanksgiving had a little worse symptoms.  Not eating a whole lot due to abdominal pain, still having some vomiting on thanksgiving day.  Was seen here last week with Dr. Redmond School for abdominal pain on 06/03/22. At that time had 10 day history of abdominal pain, vomiting, unable to keep food down.    History from 06/03/22 visit: He is here for an acute visit because of a 10-day history of difficulty with vomiting and abdominal pain as well as increased difficulty with eructation.  He states that any food can cause difficulty and he is unable to keep it down.  He has a previous history of pancreatitis over 8 years ago and has not had any alcohol consumption since then because of this.  He does smoke marijuana.  He has been taking Prilosec 40 mg regularly without any success from this.  He has diabetes and started taking Ozempic Wednesday however the symptoms actually started before that.  When he was on this prior to that he had no difficulty with abdominal distress.  Review of the record indicates he had been seen by gastroenterology in 2021 and CT scan of the abdomen showed no obvious pathology but did show evidence of aortic atherosclerosis.  He does have a history of diabetes with his most recent hemoglobin A1c of 8.6.  He apparently is already set up an appointment with gastroenterology for December 15 and states that he needs a note to stay out of work until then because of difficulty from the abdominal pain interfering with work.   Today he notes that he had not been on ozempic the 10 days leading up to his visit on 06/03/22 due to backorder.  Had been out of ozempic for a few weeks prior to symptoms.  No  recent diarrhea.  Was having some pencil thin bowel movements the time before last visit.  But bowel movements have improved some since last visit.   He was curious about restarting some medication he had used before for IBS.   No blood in stool.  No greasy or float stools.   Belching and breath sometimes smells horrible or taste like rotten eggs.  No recent alcohol.   Having some ongoing shoulder problems.   Left shoulder hurting regularly.  No recent injury or trauma.  No numbness or tingling.  Sometimes gets neck and upper back pain.  Sometimes it hurts worse with certain motions but then later the same motion can be without pain.   Past Medical History:  Diagnosis Date   Anxiety    Chronic back pain    Depression    in remote past   Diabetes mellitus without complication ([place]) 7341   Hyperlipidemia 2008   Hypertension 2008   Keloid    Microalbuminuria    Smoker    Wears glasses    Current Outpatient Medications on File Prior to Visit  Medication Sig Dispense Refill   acetaminophen (TYLENOL) 500 MG tablet Take 2,000 mg by mouth every 6 (six) hours as needed.     blood glucose meter kit and supplies Dispense based on patient and insurance preference. Test 1-2 times a day 1 each 0   buPROPion (WELLBUTRIN XL)  150 MG 24 hr tablet Take 1 tablet (150 mg total) by mouth daily. 90 tablet 3   Insulin Pen Needle (BD PEN NEEDLE NANO U/F) 32G X 4 MM MISC 1 each by Does not apply route at bedtime. 100 each 11   metFORMIN (GLUCOPHAGE) 850 MG tablet Take 1 tablet (850 mg total) by mouth 2 (two) times daily with a meal. 180 tablet 3   olmesartan (BENICAR) 5 MG tablet Take 1 tablet (5 mg total) by mouth daily. 90 tablet 3   omeprazole (PRILOSEC) 40 MG capsule TAKE 1 CAPSULE (40 MG TOTAL) BY MOUTH DAILY. PT NEEDS OFFICE VISIT. 90 capsule 1   OZEMPIC, 1 MG/DOSE, 4 MG/3ML SOPN INJECT 1MG INTO THE SKIN ONCE A WEEK     rosuvastatin (CRESTOR) 20 MG tablet Take 1 tablet (20 mg total) by mouth daily. 90  tablet 3   sildenafil (VIAGRA) 100 MG tablet Take 1 tablet (100 mg total) by mouth daily as needed. 30 tablet 5   triamcinolone cream (KENALOG) 0.1 % Apply 1 Application topically 2 (two) times daily. 30 g 0   No current facility-administered medications on file prior to visit.    ROS as in subjective   Objective: BP 130/80   Pulse (!) 105   Wt 208 lb 3.2 oz (94.4 kg)   BMI 27.10 kg/m   Wt Readings from Last 3 Encounters:  06/10/22 208 lb 3.2 oz (94.4 kg)  06/03/22 210 lb (95.3 kg)  04/08/22 224 lb 6.4 oz (101.8 kg)   General: Well-developed well-nourished no acute stress Abdomen: Soft,, positive bowel sounds, no organomegaly, no mass, mild epigastric tenderness Nontender left shoulder neck or upper back, mild pain with range of motion, no laxity, with rotator cuff test, but no weakness Arms neurovascularly intact    Assessment: Encounter Diagnoses  Name Primary?   Chronic left shoulder pain Yes   Gastroesophageal reflux disease, unspecified whether esophagitis present    Uncontrolled type 2 diabetes mellitus with hyperglycemia (HCC)    Epigastric pain    Elevated amylase    High risk medication use       Plan: Epigastric pain, GERD, elevated amylase recently-we discussed his recent visit last week with Dr. Redmond School here.  He was found to have elevated amylase.  He denies any recent alcohol use.  He does eat variety of foods and some there are potentially more fatty or greasy.  He has used dicyclomine before 1 to try this again for spasm and turning symptoms of the belly so we refilled some of this today.  I suspect his GLP-1 medication could be aggravating things.  He was advised not to use his Ozempic dose that he would be taking tomorrow.  We will hold off on GLP-1 medication temporarily.  Continue omeprazole, use dicyclomine as needed.  He already has a scheduled to see gastroenterology in a few weeks for follow-up on ongoing abdominal pain.  Advised low-fat low sugar  diet.  Also recommended against marijuana.  He has used marijuana long-term and does not feel like it is causing any problems.   we discussed that it can contribute to nausea and abdominal discomfort  Diabetes -we discussed his medications.  We will hold off on GLP-1 medication currently given his recent symptoms and the potential for pancreatitis with GLP-1 medications.  He will go back on Jardiance.  He tolerated [place] just fine but insurance would not cover this.  He will continue metformin for now.  Continue glucose monitoring.  We discussed  that we may need to add insulin or something else going forward if sugars not at goal  Left shoulder pain-referral for physical therapy, consider baseline x-ray.  We discussed possible causes.  I suspect some rotator cuff tendinitis   Dustin Zamora was seen today for follow-up.  Diagnoses and all orders for this visit:  Chronic left shoulder pain -     DG Shoulder Left; Future  Gastroesophageal reflux disease, unspecified whether esophagitis present  Uncontrolled type 2 diabetes mellitus with hyperglycemia (HCC)  Epigastric pain  Elevated amylase  High risk medication use  Other orders -     dicyclomine (BENTYL) 10 MG capsule; Take 1 capsule (10 mg total) by mouth in the morning, at noon, in the evening, and at bedtime. -     empagliflozin (JARDIANCE) 10 MG TABS tablet; Take 1 tablet (10 mg total) by mouth daily before breakfast.   F/u with GI soon

## 2022-06-10 NOTE — Progress Notes (Signed)
Put in referral to [facility]

## 2022-06-10 NOTE — Addendum Note (Signed)
Addended by: Minette Headland A on: 06/10/2022 01:58 PM   Modules accepted: Orders

## 2022-06-20 ENCOUNTER — Encounter: Payer: Self-pay | Admitting: *Deleted

## 2022-06-27 ENCOUNTER — Ambulatory Visit: Payer: Medicaid Other | Admitting: Gastroenterology

## 2022-07-14 ENCOUNTER — Encounter (HOSPITAL_COMMUNITY): Payer: Self-pay

## 2022-07-14 ENCOUNTER — Ambulatory Visit (HOSPITAL_COMMUNITY)
Admission: EM | Admit: 2022-07-14 | Discharge: 2022-07-14 | Disposition: A | Discharge status: Home / Self Care | Payer: Medicaid Other | Attending: Internal Medicine | Admitting: Internal Medicine

## 2022-07-14 DIAGNOSIS — M79675 Pain in left toe(s): Secondary | ICD-10-CM

## 2022-07-14 DIAGNOSIS — M7989 Other specified soft tissue disorders: Secondary | ICD-10-CM | POA: Diagnosis not present

## 2022-07-14 MED ORDER — CLINDAMYCIN HCL 300 MG PO CAPS: 300.0000 mg | ORAL_CAPSULE | Freq: Three times a day (TID) | ORAL | 0 refills | Status: AC

## 2022-07-14 NOTE — ED Triage Notes (Signed)
Patient with left little toe pain. States he thought something was under the nail and his gf tried to clip his nail and now the whole toe is swollen and painful.

## 2022-07-14 NOTE — Discharge Instructions (Signed)
Please soak your feet in warm Epsom salt water 2-3 times a day Please take Tylenol or Motrin as needed for pain. Please take antibiotics as recommended Avoid cutting your toenails You will benefit from podiatry evaluation.

## 2022-07-17 NOTE — ED Provider Notes (Signed)
Dustin Zamora    CSN: [id number] Arrival date & time: 07/14/22  1210      History   Chief Complaint Chief Complaint  Patient presents with   Toe Pain    HPI Dustin Zamora is a 47 y.o. male is a known diabetic who comes to urgent care with painful swelling of the left little toe of several days duration.  Patient's girlfriend tried to clip his toenails.  After that the whole toe became swollen and painful and he is developed some redness of the left little toe.  No fever or chills.  Patient denies any trauma to the left toe.  No dizziness, near syncope or syncopal episodes.  Patient does not check his sugars regularly.  No discharge from the toe.   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   BMI 30.0-30.9,adult 12/31/2020   Former smoker 12/31/2020   Plantar wart 12/31/2020   Need for vaccination 05/09/2020   Low libido 05/09/2020   Aortic atherosclerosis ([place]) 05/09/2020   Constipation 10/20/2019   Gastroesophageal reflux disease 10/20/2019   Need for influenza vaccination 05/23/2019   Noncompliance 02/18/2019   History of pancreatitis 10/05/2018   History of alcohol abuse 10/05/2018   Uncontrolled type 2 diabetes mellitus with hyperglycemia ([place]) 08/25/2018   Vaccine counseling 08/25/2018   Microalbuminuria 08/07/2016   Special screening for malignant neoplasms, colon 08/05/2016   Hyperlipidemia 08/05/2016   Erectile  dysfunction 08/05/2016   Generalized anxiety disorder 08/05/2016   Obesity 01/04/2015   Hypertension associated with diabetes ([place]) 03/31/2007    Past Surgical History:  Procedure Laterality Date   ANTERIOR CRUCIATE LIGAMENT REPAIR     right ACL repair, patellar repair, remove cartilage damage; 47yo       Home Medications    Prior to Admission medications   Medication Sig Start Date End Date Taking? Authorizing Provider  clindamycin (CLEOCIN) 300 MG capsule Take 1 capsule (300 mg total) by mouth 3 (three) times daily for 5 days. 07/14/22 07/19/22 Yes Jmarion Christiano, Myrene Galas, MD  acetaminophen (TYLENOL) 500 MG tablet Take 2,000 mg by mouth every 6 (six) hours as needed.    [provider]  blood glucose meter kit and supplies Dispense based on patient and insurance preference. Test 1-2 times a day 08/14/21   Tysinger, Camelia Eng, PA-C  buPROPion (WELLBUTRIN XL) 150 MG 24 hr tablet Take 1 tablet (150 mg total) by mouth daily. 04/09/22   Tysinger, Camelia Eng, PA-C  dicyclomine (BENTYL) 10 MG capsule Take 1 capsule (10 mg total) by mouth in the morning, at noon, in the evening, and at bedtime. 06/10/22   Tysinger, Camelia Eng, PA-C  empagliflozin (JARDIANCE) 10 MG TABS tablet Take 1 tablet (10 mg total) by mouth daily before breakfast. 06/10/22   Tysinger, Camelia Eng, PA-C  Insulin Pen Needle (BD PEN NEEDLE NANO U/F) 32G  X 4 MM MISC 1 each by Does not apply route at bedtime. 10/05/19   Tysinger, Camelia Eng, PA-C  metFORMIN (GLUCOPHAGE) 850 MG tablet Take 1 tablet (850 mg total) by mouth 2 (two) times daily with a meal. 04/09/22   Tysinger, Camelia Eng, PA-C  olmesartan (BENICAR) 5 MG tablet Take 1 tablet (5 mg total) by mouth daily. 04/09/22   Tysinger, Camelia Eng, PA-C  omeprazole (PRILOSEC) 40 MG capsule TAKE 1 CAPSULE (40 MG TOTAL) BY MOUTH DAILY. PT NEEDS OFFICE VISIT. 05/12/22   Thornton Park, MD  OZEMPIC, 1 MG/DOSE, 4 MG/3ML SOPN INJECT 1MG INTO THE SKIN ONCE A WEEK 05/26/22   [provider]   rosuvastatin (CRESTOR) 20 MG tablet Take 1 tablet (20 mg total) by mouth daily. 04/09/22   Tysinger, Camelia Eng, PA-C  sildenafil (VIAGRA) 100 MG tablet Take 1 tablet (100 mg total) by mouth daily as needed. 04/09/22   Tysinger, Camelia Eng, PA-C  triamcinolone cream (KENALOG) 0.1 % Apply 1 Application topically 2 (two) times daily. 04/08/22   Tysinger, Camelia Eng, PA-C    Family History Family History  Problem Relation Age of Onset   Kidney disease Father    Vascular Disease Father    Stroke Paternal Grandmother    Diabetes Paternal Grandmother    Cancer Neg Hx    Heart disease Neg Hx    Colon cancer Neg Hx    Rectal cancer Neg Hx    Stomach cancer Neg Hx     Social History Social History   Tobacco Use   Smoking status: Former    Packs/day: 0.50    Years: 25.00    Total pack years: 12.50    Types: Cigars, Cigarettes   Smokeless tobacco: Never  Vaping Use   Vaping Use: Never used  Substance Use Topics   Alcohol use: Not Currently    Alcohol/week: 4.0 standard drinks of alcohol    Types: 4 Shots of liquor per week   Drug use: Yes    Frequency: 7.0 times per week    Types: Marijuana    Comment: last used 12/04/19     Allergies   Penicillins   Review of Systems Review of Systems  Musculoskeletal:  Positive for arthralgias and joint swelling. Negative for neck pain and neck stiffness.  Skin:  Positive for color change and wound. Negative for pallor and rash.     Physical Exam Triage Vital Signs ED Triage Vitals  Enc Vitals Group     BP 07/14/22 1304 134/86     Pulse Rate 07/14/22 1304 65     Resp 07/14/22 1304 16     Temp 07/14/22 1304 98.4 F (36.9 C)     Temp Source 07/14/22 1304 Oral     SpO2 07/14/22 1304 97 %     Weight --      Height --      Head Circumference --      Peak Flow --      Pain Score 07/14/22 1305 7     Pain Loc --      Pain Edu? --      Excl. in [place]? --    No data found.  Updated Vital Signs BP 134/86 (BP Location: Right Arm)   Pulse 65    Temp 98.4 F (36.9 C) (Oral)   Resp 16   SpO2 97%   Visual Acuity Right Eye Distance:   Left Eye Distance:   Bilateral Distance:    Right Eye Near:  Left Eye Near:    Bilateral Near:     Physical Exam Vitals and nursing note reviewed.  Constitutional:      General: He is not in acute distress.    Appearance: He is not ill-appearing.  Cardiovascular:     Rate and Rhythm: Normal rate and regular rhythm.     Pulses: Normal pulses.     Heart sounds: Normal heart sounds.  Pulmonary:     Effort: Pulmonary effort is normal.     Breath sounds: Normal breath sounds.  Musculoskeletal:        General: Swelling and tenderness present. No signs of injury.  Skin:    General: Skin is warm.     Capillary Refill: Capillary refill takes less than 2 seconds.     Findings: Erythema present. No lesion.  Neurological:     Mental Status: He is alert.      UC Treatments / Results  Labs (all labs ordered are listed, but only abnormal results are displayed) Labs Reviewed - No data to display  EKG   Radiology No results found.  Procedures Procedures (including critical care time)  Medications Ordered in UC Medications - No data to display  Initial Impression / Assessment and Plan / UC Course  I have reviewed the triage vital signs and the nursing notes.  Pertinent labs & imaging results that were available during my care of the patient were reviewed by me and considered in my medical decision making (see chart for details).     1.  Left little toe infection in the diabetic patient: Clindamycin 300 mg 3 times daily for 5 days Patient is advised against clipping his toenails too close to the cuticle Tylenol/Motrin as needed for pain Epsom salt soaks to help with swelling and discomfort Return precautions given.  Final Clinical Impressions(s) / UC Diagnoses   Final diagnoses:  Pain and swelling of toe of left foot     Discharge Instructions      Please soak your  feet in warm Epsom salt water 2-3 times a day Please take Tylenol or Motrin as needed for pain. Please take antibiotics as recommended Avoid cutting your toenails You will benefit from podiatry evaluation.   ED Prescriptions     Medication Sig Dispense Auth. Provider   clindamycin (CLEOCIN) 300 MG capsule Take 1 capsule (300 mg total) by mouth 3 (three) times daily for 5 days. 15 capsule Jamiria Langill, Myrene Galas, MD      PDMP not reviewed this encounter.   Chase Picket, MD 07/17/22 1257

## 2022-09-01 ENCOUNTER — Encounter: Payer: Self-pay | Admitting: Podiatry

## 2022-09-01 ENCOUNTER — Ambulatory Visit (INDEPENDENT_AMBULATORY_CARE_PROVIDER_SITE_OTHER): Payer: Medicaid Other | Admitting: Podiatry

## 2022-09-01 DIAGNOSIS — M79675 Pain in left toe(s): Secondary | ICD-10-CM

## 2022-09-01 DIAGNOSIS — M79674 Pain in right toe(s): Secondary | ICD-10-CM | POA: Diagnosis not present

## 2022-09-01 DIAGNOSIS — E1149 Type 2 diabetes mellitus with other diabetic neurological complication: Secondary | ICD-10-CM

## 2022-09-01 DIAGNOSIS — B351 Tinea unguium: Secondary | ICD-10-CM

## 2022-09-01 DIAGNOSIS — E1140 Type 2 diabetes mellitus with diabetic neuropathy, unspecified: Secondary | ICD-10-CM

## 2022-09-01 NOTE — Progress Notes (Signed)
Subjective:   Patient ID: Dustin Zamora, male   DOB: 47 y.o.   MRN: [id number]   HPI Long-term diabetic not in good control who has chronic nail disease bilateral that become thick and aggravated with a enlargement of the left fifth nail that become sore and steel toe shoes.  He does have diminishment of sharp dull vibratory and states he gets occasional numbness pains secondary to his diabetes.  Patient does not smoke currently tries to be active   Review of Systems  All other systems reviewed and are negative.       Objective:  Physical Exam Vitals and nursing note reviewed.  Constitutional:      Appearance: He is well-developed.  Pulmonary:     Effort: Pulmonary effort is normal.  Musculoskeletal:        General: Normal range of motion.  Skin:    General: Skin is warm.  Neurological:     Mental Status: He is alert.     Neurovascular status was found to be intact muscle strength was found to be adequate range of motion is adequate.  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  Debrided nailbeds 1-5 both feet no iatrogenic bleeding reappoint routine care

## 2022-09-30 ENCOUNTER — Telehealth: Payer: Self-pay | Admitting: Medical

## 2022-09-30 NOTE — Telephone Encounter (Signed)
P.A. OZEMPIC 

## 2022-10-13 ENCOUNTER — Encounter: Payer: Medicaid Other | Admitting: Medical

## 2022-10-15 ENCOUNTER — Encounter: Payer: Medicaid Other | Admitting: Medical

## 2022-10-21 NOTE — Telephone Encounter (Signed)
P.A, Ozempic denied, for pt not meeting goals, called [employer] t# [id number] & was able to get approval over the phone til 10/21/22.  Vincenza Hews do you want pt on Ozempic ?

## 2022-10-24 ENCOUNTER — Telehealth: Payer: Self-pay | Admitting: Medical

## 2022-10-24 NOTE — Telephone Encounter (Signed)
P.A. JARDIANCE completed & approved til 10/21/22

## 2022-10-30 NOTE — Telephone Encounter (Signed)
Called pt and let him know Ozempic & Jardiance now approved.  He will start back on Jardiance & he takes Metformin daily.  He does not want to go back on Ozempic because he lost too much weight, said was down to 186 at 6'2".  He doesn't check his BS that often so doesn't know what he is averaging.  He has a med check appt 11/18/22.  Do you want to add something to replace the Ozempic or just wait until his med check on 11/18/22 & discuss then?

## 2022-10-31 NOTE — Telephone Encounter (Signed)
Pt was informed to stay on Jardiance & Metformin

## 2022-11-10 ENCOUNTER — Other Ambulatory Visit: Payer: Self-pay | Admitting: Gastroenterology

## 2022-11-10 ENCOUNTER — Other Ambulatory Visit: Payer: Self-pay | Admitting: Medical

## 2022-11-10 NOTE — Telephone Encounter (Signed)
This was refilled for a year supply

## 2022-11-18 ENCOUNTER — Ambulatory Visit: Payer: Medicaid Other | Admitting: Medical

## 2022-11-18 VITALS — BP 130/88 | HR 99 | Ht 73.5 in | Wt 209.4 lb

## 2022-11-18 DIAGNOSIS — M25512 Pain in left shoulder: Secondary | ICD-10-CM

## 2022-11-18 DIAGNOSIS — E1159 Type 2 diabetes mellitus with other circulatory complications: Secondary | ICD-10-CM | POA: Diagnosis not present

## 2022-11-18 DIAGNOSIS — E1165 Type 2 diabetes mellitus with hyperglycemia: Secondary | ICD-10-CM | POA: Diagnosis not present

## 2022-11-18 DIAGNOSIS — I152 Hypertension secondary to endocrine disorders: Secondary | ICD-10-CM | POA: Diagnosis not present

## 2022-11-18 DIAGNOSIS — R809 Proteinuria, unspecified: Secondary | ICD-10-CM

## 2022-11-18 DIAGNOSIS — E785 Hyperlipidemia, unspecified: Secondary | ICD-10-CM | POA: Diagnosis not present

## 2022-11-18 DIAGNOSIS — M542 Cervicalgia: Secondary | ICD-10-CM

## 2022-11-18 DIAGNOSIS — R6889 Other general symptoms and signs: Secondary | ICD-10-CM

## 2022-11-18 DIAGNOSIS — R002 Palpitations: Secondary | ICD-10-CM

## 2022-11-18 DIAGNOSIS — F411 Generalized anxiety disorder: Secondary | ICD-10-CM | POA: Diagnosis not present

## 2022-11-18 DIAGNOSIS — M25511 Pain in right shoulder: Secondary | ICD-10-CM

## 2022-11-18 DIAGNOSIS — I700 Atherosclerosis of aorta: Secondary | ICD-10-CM

## 2022-11-18 LAB — COMPREHENSIVE METABOLIC PANEL: Potassium: 4.7 mmol/L (ref 3.5–5.2)

## 2022-11-18 LAB — LIPID PANEL

## 2022-11-18 LAB — HEMOGLOBIN A1C: Hgb A1c MFr Bld: 9.4 % — ABNORMAL HIGH (ref 4.8–5.6)

## 2022-11-18 NOTE — Patient Instructions (Addendum)
Recommendations:  Please go to [facility] for your neck and shoulder xrays.   Their hours are 8am - 4:30 pm Monday - Friday.  Take your insurance card with you.  [facility] [phone]   [address] [place], [place] [address]   We called the pharmacy just now and reportedly the Jardiance can be picked up today.  Please start this right away.  Start checking your blood sugars regularly.  I gave you a prescription for strips and lancets today  Regarding diabetes Continue metformin twice a day Start the [place], samples given and script should be at pharmacy as well After 2 weeks on the Jardiance, begin trial of Rybelsus 3mg  daily tablet.  This is like and oral version of ozempic but probably not going to cause as much weight loss or side effects compared to ozempic We have to get your morning sugars to 130 or less

## 2022-11-18 NOTE — Progress Notes (Signed)
Subjective:  Dustin Zamora is a 47 y.o. male who presents for Chief Complaint  Patient presents with   Diabetes    Fasting med check. Prior Auth on Jardiance was said to be completed but pharmacy is telling him otherwise. Stopped taking Ozempic because  of significant weightloss.      Here for med check and concerns  Diabetes Compliant with metformin 850 mg twice daily.   Can't get Jardiance due to prior auth hold up.  He was on Ozempic 1 mg weekly but stopped it a few months ago due to excessive weight loss.  Not checking glucose of late.  Needs some strips, but in general hasn't been checking.  Prior did not do well with Marcelline Deist due to excess urination.  Hyperlipidemia-compliant with Crestor 20 mg daily without complaint  Compliant with olmesartan 5 mg daily for kidney protection and blood pressure  Mood and anxiety issues.  Taking Wellbutrin XL 150 mg daily  Shoulders been hurting lately.  Both shoulders hurt.  No neck pain. Doesn't sleep well due to moving around in the night with shoulder pain.   No prior injury or trauma to shoulders.  If he tries to do shoulder press with bench press he gets a lot of pain so he basically does not do those weightlifting exercises now.  Left handed.   Lately he cannot tolerate the heat.  He works outside in the heat a lot and in general in prior years, the past week.  He does have a palpitations and felt he is worried as he gets hotter in the summer months. he feels like he hydrates okay.  No other aggravating or relieving factors.    No other c/o.  Past Medical History:  Diagnosis Date   Anxiety    Chronic back pain    Depression    in remote past   Diabetes mellitus without complication (HCC) 2008   Diverticulosis    Hyperlipidemia 2008   Hypertension 2008   Keloid    Microalbuminuria    Smoker    Wears glasses    Current Outpatient Medications on File Prior to Visit  Medication Sig Dispense Refill   blood glucose meter kit and  supplies Dispense based on patient and insurance preference. Test 1-2 times a day 1 each 0   buPROPion (WELLBUTRIN XL) 150 MG 24 hr tablet Take 1 tablet (150 mg total) by mouth daily. 90 tablet 3   Insulin Pen Needle (BD PEN NEEDLE NANO U/F) 32G X 4 MM MISC 1 each by Does not apply route at bedtime. 100 each 11   metFORMIN (GLUCOPHAGE) 850 MG tablet Take 1 tablet (850 mg total) by mouth 2 (two) times daily with a meal. 180 tablet 3   NONFORMULARY OR COMPOUNDED ITEM      olmesartan (BENICAR) 5 MG tablet Take 1 tablet (5 mg total) by mouth daily. 90 tablet 3   omeprazole (PRILOSEC) 40 MG capsule TAKE 1 CAPSULE (40 MG TOTAL) BY MOUTH DAILY. PT NEEDS OFFICE VISIT. 90 capsule 1   rosuvastatin (CRESTOR) 20 MG tablet Take 1 tablet (20 mg total) by mouth daily. 90 tablet 3   acetaminophen (TYLENOL) 500 MG tablet Take 2,000 mg by mouth every 6 (six) hours as needed. (Patient not taking: Reported on 11/18/2022)     dicyclomine (BENTYL) 10 MG capsule Take 1 capsule (10 mg total) by mouth in the morning, at noon, in the evening, and at bedtime. (Patient not taking: Reported on 11/18/2022) 90 capsule 0  empagliflozin (JARDIANCE) 10 MG TABS tablet Take 1 tablet (10 mg total) by mouth daily before breakfast. (Patient not taking: Reported on 11/18/2022) 90 tablet 1   OZEMPIC, 1 MG/DOSE, 4 MG/3ML SOPN INJECT 1MG  INTO THE SKIN ONCE A WEEK (Patient not taking: Reported on 11/18/2022)     sildenafil (VIAGRA) 100 MG tablet Take 1 tablet (100 mg total) by mouth daily as needed. (Patient not taking: Reported on 11/18/2022) 30 tablet 5   triamcinolone cream (KENALOG) 0.1 % Apply 1 Application topically 2 (two) times daily. (Patient not taking: Reported on 11/18/2022) 30 g 0   No current facility-administered medications on file prior to visit.    The following portions of the patient's history were reviewed and updated as appropriate: allergies, current medications, past family history, past medical history, past social history,  past surgical history and problem list.  ROS Otherwise as in subjective above    Objective: BP 130/88 (BP Location: Right Arm, Cuff Size: Normal)   Pulse 99   Ht 6' 1.5" (1.867 m)   Wt 209 lb 6.4 oz (95 kg)   SpO2 97%   BMI 27.25 kg/m   Wt Readings from Last 3 Encounters:  11/18/22 209 lb 6.4 oz (95 kg)  06/10/22 208 lb 3.2 oz (94.4 kg)  06/03/22 210 lb (95.3 kg)   BP Readings from Last 3 Encounters:  11/18/22 130/88  07/14/22 134/86  06/10/22 130/80    General appearance: alert, no distress, well developed, well nourished Neck nontender, normal range of motion, no mass or thyromegaly Tender in bilateral rhomboids and supraspinatus region but not tender in the shoulder directly, normal range of motion shoulders without obvious deformity or swelling, rest of the arms unremarkable, arms neurovascularly intact Heart: RRR, normal S1, S2, no murmurs Lungs: CTA bilaterally, no wheezes, rhonchi, or rales Pulses: 2+ radial pulses, 2+ pedal pulses, normal cap refill Ext: no edema  EKG reviewed    Assessment: Encounter Diagnoses  Name Primary?   Uncontrolled type 2 diabetes mellitus with hyperglycemia (HCC) Yes   Hypertension associated with diabetes (HCC)    Aortic atherosclerosis (HCC)    Generalized anxiety disorder    Microalbuminuria    Hyperlipidemia, unspecified hyperlipidemia type    Heat intolerance    Palpitation    Bilateral shoulder pain, unspecified chronicity    Neck pain      Plan: Diabetes Compliant with metformin.  He stopped Ozempic due to weight loss.  We discussed other GLP 1 options.  Apparently Jardiance was held within prior authorization.  I recommended he check his blood sugars regularly.  Unfortunately he has not been checking his blood sugars.  We will check on the Jardiance prior authorization. You are showing due for eye doctor visit, diabetic eye exam.  Please see eye doctor soon to take care of this Work to have a stricter diet, more  discipline with diet and  Hyperlipidemia Continue Crestor Fasting lipid labs today  Hypertension associate with diabetes, microalbuminuria   Aortic atherosclerosis Continue statin   Heat intolerance-labs as below, EKG reviewed.  Advise good hydration at least 100 ounces per day  Bilateral shoulder and neck pain Discussed possible causes, radicualr issues, RTC, other Go for baseline x-rays  Anxiety  Continue with Wellbutrin daily   Erubey was seen today for diabetes.  Diagnoses and all orders for this visit:  Uncontrolled type 2 diabetes mellitus with hyperglycemia (HCC) -     Hemoglobin A1c -     Comprehensive metabolic panel  Hypertension associated  with diabetes (HCC) -     EKG 12-Lead -     Comprehensive metabolic panel  Aortic atherosclerosis (HCC)  Generalized anxiety disorder  Microalbuminuria  Hyperlipidemia, unspecified hyperlipidemia type -     Lipid panel  Heat intolerance -     EKG 12-Lead -     Comprehensive metabolic panel  Palpitation -     EKG 12-Lead  Bilateral shoulder pain, unspecified chronicity -     DG Shoulder Left; Future -     DG Shoulder Right; Future -     DG Cervical Spine Complete; Future  Neck pain -     DG Shoulder Left; Future -     DG Shoulder Right; Future -     DG Cervical Spine Complete; Future   Spent > 45 minutes face to face with patient in discussion of symptoms, evaluation, plan and recommendations.     Follow up: pending labs, xrays

## 2022-11-19 LAB — COMPREHENSIVE METABOLIC PANEL
AST: 16 IU/L (ref 0–40)
Albumin/Globulin Ratio: 1.9 (ref 1.2–2.2)
Albumin: 4.4 g/dL (ref 4.1–5.1)
Alkaline Phosphatase: 71 IU/L (ref 44–121)
BUN/Creatinine Ratio: 17 (ref 9–20)
Bilirubin Total: 0.6 mg/dL (ref 0.0–1.2)
CO2: 23 mmol/L (ref 20–29)
Calcium: 9.6 mg/dL (ref 8.7–10.2)
Chloride: 96 mmol/L (ref 96–106)
Creatinine, Ser: 1.1 mg/dL (ref 0.76–1.27)
Globulin, Total: 2.3 g/dL (ref 1.5–4.5)
Glucose: 266 mg/dL — ABNORMAL HIGH (ref 70–99)
Sodium: 133 mmol/L — ABNORMAL LOW (ref 134–144)
Total Protein: 6.7 g/dL (ref 6.0–8.5)
eGFR: 82 mL/min/{1.73_m2} (ref >59)

## 2022-11-19 LAB — LIPID PANEL
Cholesterol, Total: 147 mg/dL (ref 100–199)
HDL: 55 mg/dL (ref >39)
LDL Chol Calc (NIH): 80 mg/dL (ref 0–99)
Triglycerides: 58 mg/dL (ref 0–149)
VLDL Cholesterol Cal: 12 mg/dL (ref 5–40)

## 2022-11-19 LAB — HEMOGLOBIN A1C: Est. average glucose Bld gHb Est-mCnc: 223 mg/dL

## 2022-11-19 NOTE — Progress Notes (Signed)
Do we give him Rybelsus samples yesterday?    Hemoglobin A1c was 9.4%, not at goal, blood sugar 266, rest of labs okay  Continue the metformin, begin the Jardiance.  In addition to metformin and Jardiance I recommend trial of Rybelsus.  This is a once daily tablet 3 mg initial dose.  I do not think you will get the weight loss like he did with Ozempic and probably less side effects in general.  I have Rybelsus samples.  I cannot recall if I gave you samples yesterday or not  We really got to be aggressive to get the sugars down  I recommend a follow-up in 1 months to review your sugar readings.  Please check sugars fasting daily and bring your numbers in 1 month  Go for x-rays

## 2022-12-15 ENCOUNTER — Ambulatory Visit
Admission: RE | Admit: 2022-12-15 | Discharge: 2022-12-15 | Disposition: A | Discharge status: Home / Self Care | Payer: Medicaid Other | Source: Ambulatory Visit | Attending: Medical | Admitting: Medical

## 2022-12-15 DIAGNOSIS — M19012 Primary osteoarthritis, left shoulder: Secondary | ICD-10-CM | POA: Diagnosis not present

## 2022-12-15 DIAGNOSIS — M25511 Pain in right shoulder: Secondary | ICD-10-CM

## 2022-12-15 DIAGNOSIS — M542 Cervicalgia: Secondary | ICD-10-CM

## 2022-12-15 DIAGNOSIS — M25512 Pain in left shoulder: Secondary | ICD-10-CM | POA: Diagnosis not present

## 2022-12-15 DIAGNOSIS — M19011 Primary osteoarthritis, right shoulder: Secondary | ICD-10-CM | POA: Diagnosis not present

## 2022-12-17 NOTE — Progress Notes (Signed)
His x-rays show degenerative and arthritic changes of both shoulders and neck.  There is some bone spurring of the right shoulder joint, degenerative changes in the other 2 x-rays.  Make sure he is on the schedule for 1 month follow-up from last visit which would be anytime now to recheck on high blood sugars and to discuss these images further

## 2022-12-19 ENCOUNTER — Other Ambulatory Visit: Payer: Self-pay | Admitting: Medical

## 2022-12-19 MED ORDER — RYBELSUS 14 MG PO TABS: 1.0000 | ORAL_TABLET | Freq: Every day | ORAL | 1 refills | Status: DC

## 2022-12-19 MED ORDER — RYBELSUS 7 MG PO TABS: 1.0000 | ORAL_TABLET | Freq: Every day | ORAL | 0 refills | Status: DC

## 2023-01-14 ENCOUNTER — Telehealth: Payer: Self-pay | Admitting: Medical

## 2023-01-14 NOTE — Telephone Encounter (Signed)
P.A. RYBELSUS  

## 2023-01-14 NOTE — Telephone Encounter (Signed)
PA completed.

## 2023-01-16 ENCOUNTER — Telehealth: Payer: Self-pay | Admitting: Internal Medicine

## 2023-01-16 MED ORDER — RYBELSUS 7 MG PO TABS: 1.0000 | ORAL_TABLET | Freq: Every day | ORAL | 0 refills | Status: DC

## 2023-01-16 MED ORDER — RYBELSUS 14 MG PO TABS: 1.0000 | ORAL_TABLET | Freq: Every day | ORAL | 1 refills | Status: DC

## 2023-01-16 NOTE — Telephone Encounter (Signed)
refilled 

## 2023-01-19 ENCOUNTER — Other Ambulatory Visit: Payer: Self-pay | Admitting: Medical

## 2023-01-19 ENCOUNTER — Telehealth: Payer: Self-pay | Admitting: Medical

## 2023-01-19 DIAGNOSIS — N529 Male erectile dysfunction, unspecified: Secondary | ICD-10-CM

## 2023-01-19 MED ORDER — SILDENAFIL CITRATE 100 MG PO TABS: 100.0000 mg | ORAL_TABLET | Freq: Every day | ORAL | 1 refills | Status: DC | PRN

## 2023-01-19 NOTE — Telephone Encounter (Signed)
Pharmacy sent refill request for sildenafil please send to the [facility] [id number] [place], [place] - [address]

## 2023-01-20 ENCOUNTER — Ambulatory Visit (INDEPENDENT_AMBULATORY_CARE_PROVIDER_SITE_OTHER): Payer: Medicaid Other | Admitting: Medical

## 2023-01-20 ENCOUNTER — Encounter: Payer: Self-pay | Admitting: Medical

## 2023-01-20 VITALS — BP 120/80 | HR 80 | Ht 73.5 in | Wt 211.2 lb

## 2023-01-20 DIAGNOSIS — Z77018 Contact with and (suspected) exposure to other hazardous metals: Secondary | ICD-10-CM | POA: Diagnosis not present

## 2023-01-20 DIAGNOSIS — E1159 Type 2 diabetes mellitus with other circulatory complications: Secondary | ICD-10-CM

## 2023-01-20 DIAGNOSIS — R809 Proteinuria, unspecified: Secondary | ICD-10-CM | POA: Diagnosis not present

## 2023-01-20 DIAGNOSIS — E1165 Type 2 diabetes mellitus with hyperglycemia: Secondary | ICD-10-CM | POA: Diagnosis not present

## 2023-01-20 DIAGNOSIS — E785 Hyperlipidemia, unspecified: Secondary | ICD-10-CM

## 2023-01-20 DIAGNOSIS — I152 Hypertension secondary to endocrine disorders: Secondary | ICD-10-CM | POA: Diagnosis not present

## 2023-01-20 MED ORDER — TRIAMCINOLONE ACETONIDE 0.1 % EX CREA: 1.0000 | TOPICAL_CREAM | Freq: Two times a day (BID) | CUTANEOUS | 0 refills | Status: AC

## 2023-01-20 MED ORDER — BD PEN NEEDLE NANO U/F 32G X 4 MM MISC: 1.0000 | Freq: Every day | 0 refills | Status: DC

## 2023-01-20 MED ORDER — NOVOLOG FLEXPEN 100 UNIT/ML ~~LOC~~ SOPN: 10.0000 [IU] | PEN_INJECTOR | Freq: Three times a day (TID) | SUBCUTANEOUS | 1 refills | Status: DC

## 2023-01-20 MED ORDER — EMPAGLIFLOZIN 10 MG PO TABS: 10.0000 mg | ORAL_TABLET | Freq: Every day | ORAL | 1 refills | Status: DC

## 2023-01-20 MED ORDER — METFORMIN HCL 850 MG PO TABS: 850.0000 mg | ORAL_TABLET | Freq: Two times a day (BID) | ORAL | 3 refills | Status: DC

## 2023-01-20 MED ORDER — OLMESARTAN MEDOXOMIL 5 MG PO TABS: 5.0000 mg | ORAL_TABLET | Freq: Every day | ORAL | 1 refills | Status: DC

## 2023-01-20 MED ORDER — ROSUVASTATIN CALCIUM 20 MG PO TABS: 20.0000 mg | ORAL_TABLET | Freq: Every day | ORAL | 1 refills | Status: DC

## 2023-01-20 NOTE — Patient Instructions (Signed)
Diabetes Rybelsus has been approved so get back on 7 mg daily for the first month and then increase to 14 mg tablet daily the second month assuming you are tolerating this.  However, if any symptoms of pancreatitis like you had prior with ozempic, then stop the medication and call us Continue metformin 850 mg twice daily Continue Jardiance 10 mg daily You can continue a little bit of fast acting mealtime insulin Novolog (send to pharamcy) for the time being.  I would use 5 units if your sugars before meal are greater than 150, but if they are higher than that then you can use 8 to 10 units with a meal Get at least 80 to 100 ounces of water daily Continue to monitor your blood sugars.  The goal is less than 130 fasting in the morning.   Hypertension Blood pressure at goal, continue olmesartan 5 mg daily  Hyperlipidemia Continue rosuvastatin Crestor 20 mg daily  Neck and shoulder pains Your recent x-rays did show degenerative changes including degenerative changes of the C-spine, degenerative changes of the right socket as well as under the acromion, degenerative changes of the left shoulder at the [facility] joint Begin vitamin D 1000 units daily supplement over-the-counter Consider beginning glucosamine condroitin supplement over-the-counter which could give some benefit as well Stretch daily Consider doing cold therapy such as bag of ice water over the shoulder when you get off work You can use Tylenol when needed for pain or you can use Aleve on days that the pain is worse Next steps would include referral to orthopedic specialist for further evaluation and treatment recommendations

## 2023-01-20 NOTE — Progress Notes (Signed)
Subjective:  Dustin Zamora is a 47 y.o. male who presents for Chief Complaint  Patient presents with   Follow-up    Fasting follow up. Did not realize that PA was needed for Rybelsus. Looks like it was sent in 7/5 and he did not know. Took samples for 3 weeks. Will pick up. Had some Humalog that he has been using 8-10 units each evening in addition to Jardiance and metformin. Sugars fasting are 200. Had xrays and would like to go over results and plan.      Here for diabetes follow-up.  At his last visit in May his sugars were still not controlled.  At that visit we continued metformin 850 mg twice a day which she is doing.  We restarted Jardiance as there was an insurance issue with that.  He is compliant with Jardiance.  We also want him to start Rybelsus samples which she did for about 21 days and ran out awaiting prior authorization was just came through this past week.  So he initially was on Rybelsus for a little bit less than a month ago and has not restarted that several weeks.  Blood sugars have been running around 200 fasting unfortunately.  She did start back using some Humalog mealtime insulin.  He reports no side effects of medication.  Hypertension-compliant with medication  Hyperlipidemia-compliant with medication  He may want screening for heavy metals given prior work exposures.  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    No other c/o.  Past Medical History:  Diagnosis Date   Anxiety    Chronic back pain    Depression    in remote past   Diabetes mellitus without complication (HCC) 2008   Diverticulosis    Hyperlipidemia 2008   Hypertension 2008   Keloid    Microalbuminuria    Smoker    Wears glasses    Current Outpatient Medications on File Prior to Visit  Medication Sig Dispense Refill   blood glucose meter kit and supplies Dispense based on patient and  insurance preference. Test 1-2 times a day 1 each 0   buPROPion (WELLBUTRIN XL) 150 MG 24 hr tablet Take 1 tablet (150 mg total) by mouth daily. 90 tablet 3   empagliflozin (JARDIANCE) 10 MG TABS tablet Take 1 tablet (10 mg total) by mouth daily before breakfast. 90 tablet 1   Insulin Pen Needle (BD PEN NEEDLE NANO U/F) 32G X 4 MM MISC 1 each by Does not apply route at bedtime. 100 each 11   metFORMIN (GLUCOPHAGE) 850 MG tablet Take 1 tablet (850 mg total) by mouth 2 (two) times daily with a meal. 180 tablet 3   NONFORMULARY OR COMPOUNDED ITEM      olmesartan (BENICAR) 5 MG tablet Take 1 tablet (5 mg total) by mouth daily. 90 tablet 3   rosuvastatin (CRESTOR) 20 MG tablet Take 1 tablet (20 mg total) by mouth daily. 90 tablet 3   acetaminophen (TYLENOL) 500 MG tablet Take 2,000 mg by mouth every 6 (six) hours as needed. (Patient not taking: Reported on 11/18/2022)     dicyclomine (BENTYL) 10 MG capsule Take 1 capsule (10 mg total) by mouth in the morning, at noon, in the evening, and at bedtime. (Patient not taking: Reported on 11/18/2022) 90 capsule 0   Semaglutide (RYBELSUS) 14 MG TABS Take 1 tablet (14 mg total) by mouth daily. (Patient not taking: Reported  on 01/20/2023) 30 tablet 1   Semaglutide (RYBELSUS) 7 MG TABS Take 1 tablet (7 mg total) by mouth daily. (Patient not taking: Reported on 01/20/2023) 30 tablet 0   sildenafil (VIAGRA) 100 MG tablet Take 1 tablet (100 mg total) by mouth daily as needed. (Patient not taking: Reported on 01/20/2023) 30 tablet 1   triamcinolone cream (KENALOG) 0.1 % Apply 1 Application topically 2 (two) times daily. (Patient not taking: Reported on 11/18/2022) 30 g 0   No current facility-administered medications on file prior to visit.     The following portions of the patient's history were reviewed and updated as appropriate: allergies, current medications, past family history, past medical history, past social history, past surgical history and problem  list.  ROS Otherwise as in subjective above     Objective: BP 120/80   Pulse 80   Ht 6' 1.5" (1.867 m)   Wt 211 lb 3.2 oz (95.8 kg)   SpO2 94%   BMI 27.49 kg/m   Wt Readings from Last 3 Encounters:  01/20/23 211 lb 3.2 oz (95.8 kg)  11/18/22 209 lb 6.4 oz (95 kg)  06/10/22 208 lb 3.2 oz (94.4 kg)   General appearance: alert, no distress, well developed, well nourished Neck: supple, nontender, normal ROM, no lymphadenopathy, no thyromegaly, no masses Heart: RRR, normal S1, S2, no murmurs Lungs: CTA bilaterally, no wheezes, rhonchi, or rales MSK: tender over bilat AC joint, mild pain with ROM of both shoulders in general, otherwise no swelling, laxity or deformity Arms neurovascularly intact Pulses: 2+ radial pulses, 2+ pedal pulses, normal cap refill Ext: no edema   Assessment: Encounter Diagnoses  Name Primary?   Hypertension associated with diabetes (HCC) Yes   Hyperlipidemia, unspecified hyperlipidemia type    Microalbuminuria    Uncontrolled type 2 diabetes mellitus with hyperglycemia (HCC)      Plan: Diabetes Rybelsus has been approved so get back on 7 mg daily for the first month and then increase to 14 mg tablet daily the second month assuming you are tolerating this.  However, if any symptoms of pancreatitis like you had prior with ozempic, then stop the medication and call us Continue metformin 850 mg twice daily Continue Jardiance 10 mg daily You can continue a little bit of fast acting mealtime insulin Novolog (send to pharmacy) for the time being.  I would use 5 units if your sugars before meal are greater than 150, but if they are higher than that then you can use 8 to 10 units with a meal Get at least 80 to 100 ounces of water daily Continue to monitor your blood sugars.  The goal is less than 130 fasting in the morning.   Hypertension Blood pressure at goal, continue olmesartan 5 mg daily  Hyperlipidemia Continue rosuvastatin Crestor 20 mg  daily  Neck and shoulder pains Your recent x-rays did show degenerative changes including degenerative changes of the C-spine, degenerative changes of the right socket as well as under the acromion, degenerative changes of the left shoulder at the [facility] joint Begin vitamin D 1000 units daily supplement over-the-counter Consider beginning glucosamine chondroitin supplement over-the-counter which could give some benefit as well Stretch daily Consider doing cold therapy such as bag of ice water over the shoulder when you get off work You can use Tylenol when needed for pain or you can use Aleve on days that the pain is worse Next steps would include referral to orthopedic specialist for further evaluation and treatment recommendations  Concern  for metal exposure prior - he wants to do blood test next visit.  [place] was seen today for follow-up.  Diagnoses and all orders for this visit:  Hypertension associated with diabetes (HCC)  Hyperlipidemia, unspecified hyperlipidemia type  Microalbuminuria  Uncontrolled type 2 diabetes mellitus with hyperglycemia (HCC)    Follow up: 34mo

## 2023-02-05 NOTE — Telephone Encounter (Signed)
P.A. approved til 01/14/24, sent mychart message

## 2023-02-17 ENCOUNTER — Other Ambulatory Visit: Payer: Self-pay | Admitting: Medical

## 2023-03-19 ENCOUNTER — Other Ambulatory Visit: Payer: Self-pay | Admitting: Medical

## 2023-03-19 ENCOUNTER — Telehealth: Payer: Self-pay | Admitting: Medical

## 2023-03-19 MED ORDER — OLMESARTAN MEDOXOMIL 20 MG PO TABS: 20.0000 mg | ORAL_TABLET | Freq: Every day | ORAL | 0 refills | Status: DC

## 2023-03-19 NOTE — Telephone Encounter (Signed)
Pt needs refill on Olmesartan  He is back to taking 20 mg again and forgot to tell you about it so he has been taking 4 of the 5mg  and is out. He states he is keeping check on bp readings  Not taking Rybelsus wanted to make sure that you were aware of that

## 2023-03-20 ENCOUNTER — Other Ambulatory Visit: Payer: Self-pay | Admitting: Medical

## 2023-03-20 ENCOUNTER — Other Ambulatory Visit: Payer: Self-pay | Admitting: Internal Medicine

## 2023-03-20 MED ORDER — RYBELSUS 7 MG PO TABS: 1.0000 | ORAL_TABLET | Freq: Every day | ORAL | 0 refills | Status: DC

## 2023-03-20 NOTE — Progress Notes (Unsigned)
Pt has been decreased from 14mg  to 7mg 

## 2023-03-20 NOTE — Telephone Encounter (Signed)
Pt was notified.  

## 2023-03-20 NOTE — Telephone Encounter (Signed)
Pt states he was taking 7mg  and doing well on it but when he went up to 14mg  he got sick. He will be glad to stay on the 7mg  if he needs too.  He is taking jardiance and insulin for DM medication

## 2023-03-23 ENCOUNTER — Other Ambulatory Visit: Payer: Self-pay | Admitting: Medical

## 2023-04-06 ENCOUNTER — Other Ambulatory Visit: Payer: Self-pay | Admitting: Medical

## 2023-04-10 ENCOUNTER — Other Ambulatory Visit: Payer: Self-pay | Admitting: Medical

## 2023-04-10 DIAGNOSIS — N529 Male erectile dysfunction, unspecified: Secondary | ICD-10-CM

## 2023-04-27 ENCOUNTER — Ambulatory Visit (INDEPENDENT_AMBULATORY_CARE_PROVIDER_SITE_OTHER): Payer: Medicaid Other | Admitting: Medical

## 2023-04-27 VITALS — BP 124/70 | HR 75 | Wt 201.6 lb

## 2023-04-27 DIAGNOSIS — I152 Hypertension secondary to endocrine disorders: Secondary | ICD-10-CM

## 2023-04-27 DIAGNOSIS — Z125 Encounter for screening for malignant neoplasm of prostate: Secondary | ICD-10-CM

## 2023-04-27 DIAGNOSIS — E785 Hyperlipidemia, unspecified: Secondary | ICD-10-CM | POA: Diagnosis not present

## 2023-04-27 DIAGNOSIS — E1165 Type 2 diabetes mellitus with hyperglycemia: Secondary | ICD-10-CM

## 2023-04-27 DIAGNOSIS — Z23 Encounter for immunization: Secondary | ICD-10-CM

## 2023-04-27 DIAGNOSIS — E1159 Type 2 diabetes mellitus with other circulatory complications: Secondary | ICD-10-CM

## 2023-04-27 NOTE — Progress Notes (Signed)
Subjective:  Dustin Zamora is a 47 y.o. male who presents for Chief Complaint  Patient presents with   Medical Management of Chronic Issues    Med check, flu shot today, no eye exam in the last year.      Here for chronic disease follow-up.  Health history includes diabetes, high blood pressure, hyperlipidemia, microalbuminuria.  Diabetes-after last visit we made some changes as his diabetes marker was not at goal.  He is compliant with metformin 850 mg twice daily.  He is using insulin NovoLog only once daily 10 units.  Last visit we started Jardiance which she is taking and doing okay on.  He can only tolerate Rybelsus 7 mg.  He does not tolerate higher doses.  Blood sugars vary from 100s to 200  No foot lesions, no polyuria, no polydipsia.  Hyperlipidemia-compliant with Crestor 10 mg daily without complaint  Hypertension-compliant with olmesartan 20 mg daily for blood pressure and kidney protection  No other aggravating or relieving factors.    No other c/o.  Past Medical History:  Diagnosis Date   Anxiety    Chronic back pain    Depression    in remote past   Diabetes mellitus without complication (HCC) 2008   Diverticulosis    Hyperlipidemia 2008   Hypertension 2008   Keloid    Microalbuminuria    Smoker    Wears glasses    Current Outpatient Medications on File Prior to Visit  Medication Sig Dispense Refill   acetaminophen (TYLENOL) 500 MG tablet Take 2,000 mg by mouth every 6 (six) hours as needed.     buPROPion (WELLBUTRIN XL) 150 MG 24 hr tablet Take 1 tablet (150 mg total) by mouth daily. 90 tablet 3   empagliflozin (JARDIANCE) 10 MG TABS tablet Take 1 tablet (10 mg total) by mouth daily before breakfast. 90 tablet 1   insulin aspart (NOVOLOG FLEXPEN) 100 UNIT/ML FlexPen Inject 10 Units into the skin 3 (three) times daily with meals. 15 mL 1   Insulin Pen Needle (BD PEN NEEDLE NANO 2ND GEN) 32G X 4 MM MISC 1 AT BEDTIME. 100 each 5   metFORMIN (GLUCOPHAGE) 850 MG  tablet Take 1 tablet (850 mg total) by mouth 2 (two) times daily with a meal. 180 tablet 3   olmesartan (BENICAR) 20 MG tablet Take 1 tablet (20 mg total) by mouth daily. 90 tablet 0   rosuvastatin (CRESTOR) 20 MG tablet Take 1 tablet (20 mg total) by mouth daily. 90 tablet 1   Semaglutide (RYBELSUS) 7 MG TABS Take 1 tablet (7 mg total) by mouth daily. 90 tablet 0   sildenafil (VIAGRA) 100 MG tablet TAKE 1 TABLET BY MOUTH DAILY AS NEEDED 30 tablet 1   triamcinolone cream (KENALOG) 0.1 % Apply 1 Application topically 2 (two) times daily. 30 g 0   blood glucose meter kit and supplies Dispense based on patient and insurance preference. Test 1-2 times a day 1 each 0   dicyclomine (BENTYL) 10 MG capsule Take 1 capsule (10 mg total) by mouth in the morning, at noon, in the evening, and at bedtime. (Patient not taking: Reported on 11/18/2022) 90 capsule 0   Insulin Pen Needle (BD PEN NEEDLE NANO U/F) 32G X 4 MM MISC 1 each by Does not apply route at bedtime. 100 each 11   NONFORMULARY OR COMPOUNDED ITEM      No current facility-administered medications on file prior to visit.     The following portions of the patient's  history were reviewed and updated as appropriate: allergies, current medications, past family history, past medical history, past social history, past surgical history and problem list.  ROS Otherwise as in subjective above  Objective: BP 124/70   Pulse 75   Wt 201 lb 9.6 oz (91.4 kg)   BMI 26.24 kg/m   General appearance: alert, no distress, well developed, well nourished Neck: supple, no lymphadenopathy, no thyromegaly, no masses Heart: RRR, normal S1, S2, no murmurs Lungs: CTA bilaterally, no wheezes, rhonchi, or rales Pulses: 2+ radial pulses, 2+ pedal pulses, normal cap refill Ext: no edema  Diabetic Foot Exam - Simple   Simple Foot Form Diabetic Foot exam was performed with the following findings: Yes 04/27/2023  9:40 AM  Visual Inspection No deformities, no  ulcerations, no other skin breakdown bilaterally: Yes Sensation Testing Intact to touch and monofilament testing bilaterally: Yes Pulse Check Posterior Tibialis and Dorsalis pulse intact bilaterally: Yes Comments       Assessment: Encounter Diagnoses  Name Primary?   Uncontrolled type 2 diabetes mellitus with hyperglycemia (HCC) Yes   Needs flu shot    Screening for prostate cancer    Hyperlipidemia, unspecified hyperlipidemia type    Hypertension associated with diabetes (HCC)      Plan: Diabetes-updated labs today.  Will likely change from NovoLog to long-acting insulin since he is only doing this once daily at 10 units daily.  He only tolerates Rybelsus at 7 mg.  Continue Jardiance and metformin.  Jardiance started last visit.  Encouraged him to see his eye doctor soon as he is past due for yearly eye exam  Microalbumin screening today.  Continue ARB as he has no microalbuminuria  Hyperlipidemia-last labs at goal.  Continue statin Crestor 20 mg daily  Hypertension-continue olmesartan 20 mg daily  We discussed that if in the future you get sick such as stomach flu, influenza, or any illness where you are losing fluids through vomiting or diarrhea then you would need to cut back to a half dose on Jardiance and olmesartan short-term to protect the kidney with periods of dehydration  PSA screening today for prostate cancer screening.   Counseled on the influenza virus vaccine.  Vaccine information sheet given.  Influenza vaccine given after consent obtained.    Dustin Zamora was seen today for medical management of chronic issues.  Diagnoses and all orders for this visit:  Uncontrolled type 2 diabetes mellitus with hyperglycemia (HCC) -     Hemoglobin A1c -     Microalbumin/Creatinine Ratio, Urine -     CBC -     Basic metabolic panel  Needs flu shot -     Flu vaccine trivalent PF, 6mos and older(Flulaval,Afluria,Fluarix,Fluzone)  Screening for prostate cancer -      PSA -     CBC  Hyperlipidemia, unspecified hyperlipidemia type  Hypertension associated with diabetes (HCC) -     Basic metabolic panel    Follow up: pending labs

## 2023-04-27 NOTE — Patient Instructions (Signed)
Diabetes-updated labs today.  Will likely change from NovoLog to long-acting insulin since he is only doing this once daily at 10 units daily.  He only tolerates Rybelsus at 7 mg.  Continue Jardiance and metformin.  Jardiance started last visit.  Encouraged him to see his eye doctor soon as he is past due for yearly eye exam  Microalbumin screening today.  Continue ARB as he has no microalbuminuria  Hyperlipidemia-last labs at goal.  Continue statin Crestor 20 mg daily  Hypertension-continue olmesartan 20 mg daily  We discussed that if in the future you get sick such as stomach flu, influenza, or any illness where you are losing fluids through vomiting or diarrhea then you would need to cut back to a half dose on Jardiance and olmesartan short-term to protect the kidney with periods of dehydration  PSA screening today for prostate cancer screening.   Counseled on the influenza virus vaccine.  Vaccine information sheet given.  Influenza vaccine given after consent obtained.

## 2023-04-28 ENCOUNTER — Other Ambulatory Visit: Payer: Self-pay | Admitting: Medical

## 2023-04-28 LAB — BASIC METABOLIC PANEL
BUN/Creatinine Ratio: 12 (ref 9–20)
BUN: 14 mg/dL (ref 6–24)
CO2: 22 mmol/L (ref 20–29)
Calcium: 9.2 mg/dL (ref 8.7–10.2)
Chloride: 102 mmol/L (ref 96–106)
Creatinine, Ser: 1.15 mg/dL (ref 0.76–1.27)
Glucose: 185 mg/dL — ABNORMAL HIGH (ref 70–99)
Potassium: 4.6 mmol/L (ref 3.5–5.2)
Sodium: 139 mmol/L (ref 134–144)
eGFR: 78 mL/min/{1.73_m2} (ref >59)

## 2023-04-28 LAB — CBC
Hematocrit: 47 % (ref 37.5–51.0)
Hemoglobin: 15 g/dL (ref 13.0–17.7)
MCH: 31.1 pg (ref 26.6–33.0)
MCHC: 31.9 g/dL (ref 31.5–35.7)
MCV: 98 fL — ABNORMAL HIGH (ref 79–97)
Platelets: 276 10*3/uL (ref 150–450)
RBC: 4.82 x10E6/uL (ref 4.14–5.80)
RDW: 12.1 % (ref 11.6–15.4)
WBC: 4.3 10*3/uL (ref 3.4–10.8)

## 2023-04-28 LAB — MICROALBUMIN / CREATININE URINE RATIO
Creatinine, Urine: 159.4 mg/dL
Microalb/Creat Ratio: 16 mg/g{creat} (ref 0–29)
Microalbumin, Urine: 25.9 ug/mL

## 2023-04-28 LAB — PSA: Prostate Specific Ag, Serum: 0.7 ng/mL (ref 0.0–4.0)

## 2023-04-28 LAB — HEMOGLOBIN A1C
Est. average glucose Bld gHb Est-mCnc: 209 mg/dL
Hgb A1c MFr Bld: 8.9 % — ABNORMAL HIGH (ref 4.8–5.6)

## 2023-04-28 MED ORDER — INSULIN GLARGINE (1 UNIT DIAL) 300 UNIT/ML ~~LOC~~ SOPN: 15.0000 [IU] | PEN_INJECTOR | Freq: Every day | SUBCUTANEOUS | 1 refills | Status: DC

## 2023-04-28 MED ORDER — BASAGLAR KWIKPEN 100 UNIT/ML ~~LOC~~ SOPN: 15.0000 [IU] | PEN_INJECTOR | Freq: Every day | SUBCUTANEOUS | 2 refills | Status: DC

## 2023-04-28 MED ORDER — BUPROPION HCL ER (XL) 150 MG PO TB24: 150.0000 mg | ORAL_TABLET | Freq: Every day | ORAL | 3 refills | Status: DC

## 2023-04-28 NOTE — Progress Notes (Signed)
Results sent through MyChart

## 2023-04-29 ENCOUNTER — Other Ambulatory Visit: Payer: Self-pay | Admitting: Medical

## 2023-04-29 MED ORDER — INSULIN GLARGINE 100 UNIT/ML SOLOSTAR PEN: 15.0000 [IU] | PEN_INJECTOR | Freq: Every day | SUBCUTANEOUS | 2 refills | Status: DC

## 2023-04-29 NOTE — Telephone Encounter (Signed)
Checked Medicaid website & preferred long acting meds are Lantus Solostar brand or generic glargine Solostar or Levemir Flex Pen/ Flex touch or Vial please switch to one of these

## 2023-04-29 NOTE — Telephone Encounter (Signed)
Pt switched to Lantus

## 2023-04-29 NOTE — Progress Notes (Signed)
Results sent through MyChart

## 2023-04-29 NOTE — Addendum Note (Signed)
Addended by: Marissa Calamity D on: 04/29/2023 10:50 AM   Modules accepted: Orders

## 2023-05-18 ENCOUNTER — Telehealth: Payer: Self-pay | Admitting: Medical

## 2023-05-18 NOTE — Telephone Encounter (Signed)
Jairen dropped off forms to be filled out, was put in [employer].

## 2023-05-20 ENCOUNTER — Other Ambulatory Visit: Payer: Self-pay | Admitting: Medical

## 2023-05-20 ENCOUNTER — Telehealth: Payer: Self-pay | Admitting: Medical

## 2023-05-20 MED ORDER — FREESTYLE LIBRE 3 PLUS SENSOR MISC: 1 refills | Status: DC

## 2023-05-20 NOTE — Telephone Encounter (Signed)
Working on [place] for this.

## 2023-05-20 NOTE — Telephone Encounter (Signed)
Pt states that he is checking every other day by regular glucose meter. I have sent in [place] 3 plus in as he would like this better. Pt has had no low readings or any symptoms. His BS is reading 100-200.  Pt has gone back to the Rybelsus 14mg  again for the last 2 weeks and doing well as it has not made him . He would like an rx for 14mg .

## 2023-05-20 NOTE — Telephone Encounter (Signed)
I received a form from the [place] [employer] transportation regarding his diabetes  They are requesting some information and verification  I need to ask him some questions as below?  I believe he is using a regular glucometer and not the freestyle libre.  They are questioning makes me think that they really want him to use a continuous glucose monitor to get much better documentation  If agreeable I would switch to freestyle libre  How often is he checking his blood sugars currently?  Are they in a way that we can download them or is he keeping track of this on paper?  They are requiring [place] to make sure we have a way to document or monitor his blood sugar readings either through careful documentation in a notebook or preferably electronically or downloadable information.  Has he had any low readings under 70 in the last 3 months?  If so how often does this happen?  Is he having any symptoms if he gets low readings such as shakiness, sweats, confusion, other?

## 2023-05-21 MED ORDER — FREESTYLE LIBRE 3 SENSOR MISC: 5 refills | Status: DC

## 2023-05-21 NOTE — Telephone Encounter (Signed)
Faxed form back.

## 2023-05-21 NOTE — Telephone Encounter (Signed)
PA has been approved for [facility] 3 sensor, Approved  through 11/18/2023

## 2023-06-09 ENCOUNTER — Other Ambulatory Visit: Payer: Self-pay | Admitting: Medical

## 2023-06-10 NOTE — Telephone Encounter (Signed)
Per note last month. Pt went back to 14mg  and was not having any trouble

## 2023-06-18 ENCOUNTER — Other Ambulatory Visit: Payer: Self-pay | Admitting: Medical

## 2023-06-29 ENCOUNTER — Ambulatory Visit: Payer: Medicaid Other | Admitting: Medical

## 2023-06-29 VITALS — BP 124/80 | HR 82 | Wt 223.6 lb

## 2023-06-29 DIAGNOSIS — R519 Headache, unspecified: Secondary | ICD-10-CM | POA: Diagnosis not present

## 2023-06-29 DIAGNOSIS — R6884 Jaw pain: Secondary | ICD-10-CM | POA: Diagnosis not present

## 2023-06-29 DIAGNOSIS — F458 Other somatoform disorders: Secondary | ICD-10-CM | POA: Diagnosis not present

## 2023-06-29 MED ORDER — CLINDAMYCIN HCL 150 MG PO CAPS: 150.0000 mg | ORAL_CAPSULE | Freq: Three times a day (TID) | ORAL | 0 refills | Status: DC

## 2023-06-29 NOTE — Progress Notes (Signed)
Subjective:  Dustin Zamora is a 47 y.o. male who presents for Chief Complaint  Patient presents with   Jaw Pain    Jaw pain x 2 weeks. Grinding teeth, and pain     Been having pain in left face/jaw x 2 weeks.   Awakes with pain, constantly in pain.  At times hurts to bite and chew.   Thinks its related to grinding teeth at night.  Has used ibuprofen.   Had a pop sound when it first started.   Saw dentist 3 months.  Had some cavities filled at that time.    He does feel like he grinds his teeth at night  He does snore some.  No witnessed apnea.  No current complaint of daytime somnolence or headaches.  He does keep some sinus congestion chronically but no recent sinus pressure  No other aggravating or relieving factors.    No other c/o.  The following portions of the patient's history were reviewed and updated as appropriate: allergies, current medications, past family history, past medical history, past social history, past surgical history and problem list.  ROS Otherwise as in subjective above   Objective: BP 124/80   Pulse 82   Wt 223 lb 9.6 oz (101.4 kg)   BMI 29.10 kg/m   General appearance: alert, no distress, well developed, well nourished HEENT: normocephalic, sclerae anicteric, conjunctiva pink and moist, TMs pearly, nares patent, no discharge or erythema, pharynx normal Oral cavity: MMM, there seems to be some slight fullness to the left buccal mucosa compared to the right but no obvious lesions Neck: supple, no lymphadenopathy, no thyromegaly, no masses    Assessment: Encounter Diagnoses  Name Primary?   Jaw pain Yes   Facial pain    Teeth grinding      Plan: Previous discussed potential differential.  Based on exam today no ear infection, no TMJ syndrome.  He likely does have some teeth grinding at night.  Per his dentist at his last visit he still has some cavities on the left upper he needs to have filled.  So teeth grinding at night.  Dental caries  could be causing some discomfort.  If he has a flareup of teeth pain in the next couple weeks he can use a round of Cleocin prescribed.  Follow-up with dentist for trial of guard for grinding and to have the other dental caries resolved  Dustin Zamora was seen today for jaw pain.  Diagnoses and all orders for this visit:  Jaw pain  Facial pain  Teeth grinding  Other orders -     clindamycin (CLEOCIN) 150 MG capsule; Take 1 capsule (150 mg total) by mouth 3 (three) times daily.    Follow up: with dentist

## 2023-07-23 ENCOUNTER — Other Ambulatory Visit: Payer: Self-pay | Admitting: Medical

## 2023-07-28 ENCOUNTER — Ambulatory Visit: Payer: Medicaid Other | Admitting: Medical

## 2023-07-30 ENCOUNTER — Encounter: Payer: Self-pay | Admitting: Medical

## 2023-07-30 ENCOUNTER — Telehealth: Payer: Medicaid Other | Admitting: Medical

## 2023-07-30 VITALS — Temp 101.0°F | Wt 215.0 lb

## 2023-07-30 DIAGNOSIS — R1012 Left upper quadrant pain: Secondary | ICD-10-CM

## 2023-07-30 DIAGNOSIS — Z20828 Contact with and (suspected) exposure to other viral communicable diseases: Secondary | ICD-10-CM | POA: Diagnosis not present

## 2023-07-30 DIAGNOSIS — R112 Nausea with vomiting, unspecified: Secondary | ICD-10-CM

## 2023-07-30 DIAGNOSIS — J111 Influenza due to unidentified influenza virus with other respiratory manifestations: Secondary | ICD-10-CM | POA: Diagnosis not present

## 2023-07-30 DIAGNOSIS — Z8719 Personal history of other diseases of the digestive system: Secondary | ICD-10-CM

## 2023-07-30 DIAGNOSIS — Z79899 Other long term (current) drug therapy: Secondary | ICD-10-CM

## 2023-07-30 MED ORDER — ONDANSETRON 4 MG PO TBDP: 4.0000 mg | ORAL_TABLET | Freq: Three times a day (TID) | ORAL | 0 refills | Status: DC | PRN

## 2023-07-30 MED ORDER — OSELTAMIVIR PHOSPHATE 75 MG PO CAPS: 75.0000 mg | ORAL_CAPSULE | Freq: Two times a day (BID) | ORAL | 0 refills | Status: DC

## 2023-07-30 NOTE — Patient Instructions (Signed)
Given your exposure to flu and flulike symptoms, we will treat you empirically for influenza  Begin Tamiflu twice daily for 5 days to help cut down on the overall severity and timeframe of being sick with flu REST particularly the next 2 to 3 days.  We will write a work note out through Sunday or Monday as it generally takes several days to resolve with the flu and we do not want you spreading this to other people Hydrate well throughout the day such as water, G2 Gatorade, Pedialyte, soup broth and other liquids You can begin Zofran as needed for nausea every 4-6 hours You can use short-term over-the-counter Tylenol for pain and fever or you can alternate with ibuprofen every 4-6 hours to help with fever not feeling well If you are much worse in the next few days such as trouble breathing, severe dehydration, extreme fatigue, or other then get reevaluated  There is a concern today for possible pancreatitis.  At this point we need to just stop Rybelsus and not use any similar medication in the future  Regarding pancreatitis symptoms, avoid alcohol the next few days, avoid fatty and greasy and large carbohydrate meals.  Actually we would like you to just do fluids only for the next 24 to 48 hours.  No solid food for at least 1 day or more until your left upper quadrant abdominal pain resolves.  If you have worse symptoms of left upper quadrant belly pain, nausea and vomiting over the next few days then get reevaluated  Since we are stopping Rybelsus, he will need to continue your other diabetes medicines but we can increase the Lantus from 10 units daily up to 13 to 15 units daily.  You can actually increase Lantus long-acting insulin 2 units/week until your blood sugars are averaging less than 130 fasting in the morning  Temporarily while you are sick I want you to hold off on your Jardiance for 3 days.  Also cut your telmisartan 20 mg in half and do 1/2 tablet daily for 3 days to give your kidneys a  break into let you stay hydrated  Plan on either rechecking or touching base with me in 3 to 4 weeks on your blood sugar readings since we are having to make modifications today  If you are not improved at all within the next 48 hours or worse then get reevaluated or go to the emergency department

## 2023-07-30 NOTE — Progress Notes (Signed)
Subjective:     Patient ID: Dustin Zamora, male   DOB: [birth year], 48 y.o.   MRN: [id number]  This visit type was conducted due to national recommendations for restrictions regarding the COVID-19 Pandemic (e.g. social distancing) in an effort to limit this patient's exposure and mitigate transmission in our community.  Due to their co-morbid illnesses, this patient is at least at moderate risk for complications without adequate follow up.  This format is felt to be most appropriate for this patient at this time.    Documentation for virtual audio and video telecommunications through [place] encounter:  The patient was located at home. The provider was located in the office. The patient did consent to this visit and is aware of possible charges through their insurance for this visit.  The other persons participating in this telemedicine service were none. Time spent on call was 20 minutes and in review of previous records 20 minutes total.  This virtual service is not related to other E/M service within previous 7 days.   HPI Chief Complaint  Patient presents with   sick    Sick, Headache, chest hurts, congestion, body aches, temp, cough and diarrhea. Has pancreatitis too. Symptoms started yesterday.    Virtual consult for illness.  He reports several symptoms.  Started yesterday with symptoms.  He notes chills, body aches, chest hurts, head hurts.   Has had fever.   Vomitied once,has nausea. At times a little SOB.   Lying in the bed, fatigue.  Has quite a bit headache.  Daughter is sick with flu currently and he feels that he picked this up from her  He is also concerned about pancreatitis.  He has had pancreatitis prior but he has current symptoms of both generalized abdominal cramps and pain at times, but does feel discomfort specifically in LUQ x a few weeks.  Vomited once today and is nauseated.  No changes in stool, no blood in the stool, no color change in stool.  No other new  complaints  Compliant with medications in general    Past Medical History:  Diagnosis Date   Anxiety    Chronic back pain    Depression    in remote past   Diabetes mellitus without complication (HCC) 2008   Diverticulosis    Hyperlipidemia 2008   Hypertension 2008   Keloid    Microalbuminuria    Smoker    Wears glasses    Current Outpatient Medications on File Prior to Visit  Medication Sig Dispense Refill   acetaminophen (TYLENOL) 500 MG tablet Take 2,000 mg by mouth every 6 (six) hours as needed.     buPROPion (WELLBUTRIN XL) 150 MG 24 hr tablet Take 1 tablet (150 mg total) by mouth daily. 90 tablet 3   clindamycin (CLEOCIN) 150 MG capsule Take 1 capsule (150 mg total) by mouth 3 (three) times daily. 30 capsule 0   dicyclomine (BENTYL) 10 MG capsule Take 1 capsule (10 mg total) by mouth in the morning, at noon, in the evening, and at bedtime. 90 capsule 0   empagliflozin (JARDIANCE) 10 MG TABS tablet Take 1 tablet (10 mg total) by mouth daily before breakfast. 90 tablet 1   insulin glargine (LANTUS) 100 UNIT/ML Solostar Pen Inject 15 Units into the skin daily. 15 mL 2   metFORMIN (GLUCOPHAGE) 850 MG tablet Take 1 tablet (850 mg total) by mouth 2 (two) times daily with a meal. 180 tablet 3   olmesartan (BENICAR) 20 MG tablet TAKE  1 TABLET BY MOUTH EVERY DAY 90 tablet 1   rosuvastatin (CRESTOR) 20 MG tablet TAKE 1 TABLET BY MOUTH EVERY DAY 90 tablet 1   RYBELSUS 14 MG TABS TAKE 1 TABLET (14 MG TOTAL) BY MOUTH DAILY 30 tablet 1   triamcinolone cream (KENALOG) 0.1 % Apply 1 Application topically 2 (two) times daily. 30 g 0   blood glucose meter kit and supplies Dispense based on patient and insurance preference. Test 1-2 times a day 1 each 0   Continuous Glucose Sensor (FREESTYLE LIBRE 3 SENSOR) MISC Place 1 sensor on the skin every 14 days. Use to check glucose continuously 2 each 5   Insulin Pen Needle (BD PEN NEEDLE NANO 2ND GEN) 32G X 4 MM MISC 1 AT BEDTIME. 100 each 5    Insulin Pen Needle (BD PEN NEEDLE NANO U/F) 32G X 4 MM MISC 1 each by Does not apply route at bedtime. 100 each 11   NONFORMULARY OR COMPOUNDED ITEM      sildenafil (VIAGRA) 100 MG tablet TAKE 1 TABLET BY MOUTH DAILY AS NEEDED 30 tablet 1   No current facility-administered medications on file prior to visit.     Review of Systems As in subjective    Objective:   Physical Exam Due to coronavirus pandemic stay at home measures, patient visit was virtual and they were not examined in person.   Temp (!) 101 F (38.3 C)   Wt 215 lb (97.5 kg)   BMI 27.98 kg/m  Wt Readings from Last 3 Encounters:  07/30/23 215 lb (97.5 kg)  06/29/23 223 lb 9.6 oz (101.4 kg)  04/27/23 201 lb 9.6 oz (91.4 kg)    General: Well-developed well-nourished no acute distress, ill-appearing No labored breathing or wheezing      Assessment:     Encounter Diagnoses  Name Primary?   Influenza Yes   Nausea and vomiting, unspecified vomiting type    LUQ pain    High risk medication use    History of pancreatitis    Exposure to influenza        Plan:     Given your exposure to flu and flulike symptoms, we will treat you empirically for influenza  Begin Tamiflu twice daily for 5 days to help cut down on the overall severity and timeframe of being sick with flu REST particularly the next 2 to 3 days.  We will write a work note out through Sunday or Monday as it generally takes several days to resolve with the flu and we do not want you spreading this to other people Hydrate well throughout the day such as water, G2 Gatorade, Pedialyte, soup broth and other liquids You can begin Zofran as needed for nausea every 4-6 hours You can use short-term over-the-counter Tylenol for pain and fever or you can alternate with ibuprofen every 4-6 hours to help with fever not feeling well If you are much worse in the next few days such as trouble breathing, severe dehydration, extreme fatigue, or other then get  reevaluated  There is a concern today for possible pancreatitis.  At this point we need to just stop Rybelsus and not use any similar medication in the future  Regarding pancreatitis symptoms, avoid alcohol the next few days, avoid fatty and greasy and large carbohydrate meals.  Actually we would like you to just do fluids only for the next 24 to 48 hours.  No solid food for at least 1 day or more until your left upper  quadrant abdominal pain resolves.  If you have worse symptoms of left upper quadrant belly pain, nausea and vomiting over the next few days then get reevaluated  Since we are stopping Rybelsus, he will need to continue your other diabetes medicines but we can increase the Lantus from 10 units daily up to 13 to 15 units daily.  You can actually increase Lantus long-acting insulin 2 units/week until your blood sugars are averaging less than 130 fasting in the morning  Temporarily while you are sick I want you to hold off on your Jardiance for 3 days.  Also cut your telmisartan 20 mg in half and do 1/2 tablet daily for 3 days to give your kidneys a break into let you stay hydrated  Plan on either rechecking or touching base with me in 3 to 4 weeks on your blood sugar readings since we are having to make modifications today  If you are not improved at all within the next 48 hours or worse then get reevaluated or go to the emergency department    Poul was seen today for sick.  Diagnoses and all orders for this visit:  Influenza  Nausea and vomiting, unspecified vomiting type  LUQ pain  High risk medication use  History of pancreatitis  Exposure to influenza  Other orders -     oseltamivir (TAMIFLU) 75 MG capsule; Take 1 capsule (75 mg total) by mouth 2 (two) times daily. -     ondansetron (ZOFRAN-ODT) 4 MG disintegrating tablet; Take 1 tablet (4 mg total) by mouth every 8 (eight) hours as needed for nausea or vomiting.    F/u 3-4 weeks

## 2023-08-12 ENCOUNTER — Ambulatory Visit (INDEPENDENT_AMBULATORY_CARE_PROVIDER_SITE_OTHER): Payer: Medicaid Other | Admitting: Medical

## 2023-08-12 VITALS — BP 122/80 | HR 108 | Wt 213.8 lb

## 2023-08-12 DIAGNOSIS — R6884 Jaw pain: Secondary | ICD-10-CM | POA: Diagnosis not present

## 2023-08-12 DIAGNOSIS — F458 Other somatoform disorders: Secondary | ICD-10-CM

## 2023-08-12 DIAGNOSIS — M2141 Flat foot [pes planus] (acquired), right foot: Secondary | ICD-10-CM

## 2023-08-12 DIAGNOSIS — Z8709 Personal history of other diseases of the respiratory system: Secondary | ICD-10-CM | POA: Diagnosis not present

## 2023-08-12 DIAGNOSIS — R918 Other nonspecific abnormal finding of lung field: Secondary | ICD-10-CM | POA: Diagnosis not present

## 2023-08-12 DIAGNOSIS — M2142 Flat foot [pes planus] (acquired), left foot: Secondary | ICD-10-CM | POA: Diagnosis not present

## 2023-08-12 DIAGNOSIS — M79671 Pain in right foot: Secondary | ICD-10-CM

## 2023-08-12 MED ORDER — ALBUTEROL SULFATE HFA 108 (90 BASE) MCG/ACT IN AERS: 2.0000 | INHALATION_SPRAY | Freq: Four times a day (QID) | RESPIRATORY_TRACT | 0 refills | Status: DC | PRN

## 2023-08-12 MED ORDER — TIZANIDINE HCL 4 MG PO TABS: 4.0000 mg | ORAL_TABLET | Freq: Every day | ORAL | 0 refills | Status: DC

## 2023-08-12 NOTE — Progress Notes (Signed)
Subjective:  Dustin Zamora is a 48 y.o. male who presents for Chief Complaint  Patient presents with   Jaw Pain    Jaw pain, hurting on left side. Has mouth guard that was recommended. Would like his lungs listened too since he is just getting over Flu     Here for several concerns  He saw me a few months ago for jaw pain.  Still having some jaw pain.  He does grind his teeth and wears a nightguard.  The nightguard seem to help the night but once he takes the nightguard out in the morning his teeth hurt again as if the teeth are not lining up properly when they touch.  He last saw dentist maybe a month or so ago and had some cavities filled.  He did a round of antibiotics when he saw me in December but the pain continues  He has a tender spot on his right foot.  No injury no trauma no swelling or bruising.  No numbness or tingling.  He was concerned about athlete's foot  He just had the flu recently and wants his lungs checked.  He still feels like mucus is in his chest or not fully back to normal  He is a sometimes smoker  No other aggravating or relieving factors.    No other c/o.  Past Medical History:  Diagnosis Date   Anxiety    Chronic back pain    Depression    in remote past   Diabetes mellitus without complication (HCC) 2008   Diverticulosis    Hyperlipidemia 2008   Hypertension 2008   Keloid    Microalbuminuria    Smoker    Wears glasses    Current Outpatient Medications on File Prior to Visit  Medication Sig Dispense Refill   buPROPion (WELLBUTRIN XL) 150 MG 24 hr tablet Take 1 tablet (150 mg total) by mouth daily. 90 tablet 3   clindamycin (CLEOCIN) 150 MG capsule Take 1 capsule (150 mg total) by mouth 3 (three) times daily. 30 capsule 0   Continuous Glucose Sensor (FREESTYLE LIBRE 3 SENSOR) MISC Place 1 sensor on the skin every 14 days. Use to check glucose continuously 2 each 5   dicyclomine (BENTYL) 10 MG capsule Take 1 capsule (10 mg total) by mouth in the  morning, at noon, in the evening, and at bedtime. 90 capsule 0   empagliflozin (JARDIANCE) 10 MG TABS tablet Take 1 tablet (10 mg total) by mouth daily before breakfast. 90 tablet 1   insulin glargine (LANTUS) 100 UNIT/ML Solostar Pen Inject 15 Units into the skin daily. 15 mL 2   metFORMIN (GLUCOPHAGE) 850 MG tablet Take 1 tablet (850 mg total) by mouth 2 (two) times daily with a meal. 180 tablet 3   olmesartan (BENICAR) 20 MG tablet TAKE 1 TABLET BY MOUTH EVERY DAY 90 tablet 1   ondansetron (ZOFRAN-ODT) 4 MG disintegrating tablet Take 1 tablet (4 mg total) by mouth every 8 (eight) hours as needed for nausea or vomiting. 20 tablet 0   rosuvastatin (CRESTOR) 20 MG tablet TAKE 1 TABLET BY MOUTH EVERY DAY 90 tablet 1   sildenafil (VIAGRA) 100 MG tablet TAKE 1 TABLET BY MOUTH DAILY AS NEEDED 30 tablet 1   triamcinolone cream (KENALOG) 0.1 % Apply 1 Application topically 2 (two) times daily. 30 g 0   acetaminophen (TYLENOL) 500 MG tablet Take 2,000 mg by mouth every 6 (six) hours as needed.     blood glucose meter  kit and supplies Dispense based on patient and insurance preference. Test 1-2 times a day 1 each 0   Insulin Pen Needle (BD PEN NEEDLE NANO 2ND GEN) 32G X 4 MM MISC 1 AT BEDTIME. 100 each 5   Insulin Pen Needle (BD PEN NEEDLE NANO U/F) 32G X 4 MM MISC 1 each by Does not apply route at bedtime. 100 each 11   NONFORMULARY OR COMPOUNDED ITEM      No current facility-administered medications on file prior to visit.     The following portions of the patient's history were reviewed and updated as appropriate: allergies, current medications, past family history, past medical history, past social history, past surgical history and problem list.  ROS Otherwise as in subjective above  Objective: BP 122/80   Pulse (!) 108   Wt 213 lb 12.8 oz (97 kg)   SpO2 98%   BMI 27.83 kg/m   General appearance: alert, no distress, well developed, well nourished, nontender of the head and face no  obvious swelling or deformity HEENT: normocephalic, sclerae anicteric, conjunctiva pink and moist, TMs pearly, nares patent, no discharge or erythema, pharynx normal, no obvious facial abnormality or jaw abnormality Oral cavity: MMM, no lesions Neck: supple, no lymphadenopathy, no thyromegaly, no masses Heart: RRR, normal S1, S2, no murmurs Lungs: Few faint scattered wheezes, few rhonchi, or rales Pulses: 2+ radial pulses, 2+ pedal pulses, normal cap refill Ext: no edema Bilateral feet with flat arches, no obvious deformity of feet or toes, no obvious foot fungus, otherwise unremarkable exam, normal range of motion of feet and toes    Assessment: Encounter Diagnoses  Name Primary?   Foot pain, right Yes   Flat feet    Jaw pain    Teeth grinding    History of influenza    Lung field abnormal      Plan: Foot pain, flatfeet-no obvious deformity of right foot fourth and fifth toe on exam but he does have flatfeet.  Nontender on exam either.  We discussed possible causes of his discomfort.  Advise he wear shoes with good arch support.  Avoid wearing too tight of a shoe.  Do some foot soaks in hot water 20 minutes at a time for the next several days.  If not improving over the next few weeks by wearing good supportive shoes, I can refer to podiatry for further evaluation  Jaw pain-nontender on exam and no obvious deformity.  I suspect this is still related to either a malalignment of his teeth from recent dental amalgam or achiness from teeth grinding in general.  Continue bite guard at night.  Can use tizanidine as needed.  Can use over-the-counter NSAID short-term as needed.  Follow-up with dentist soon.  He was just on a round of clindamycin a few weeks ago for similar discomfort thought to be related to dental infection.  At this point advised against any additional antibiotics.  If symptoms continue and dentist does not feel this is a dental issue we can pursue head scan if needed  He is  getting over influenza from last week, but is a little bit tight in the chest today with some wheezes.  Begin some Mucinex DM over-the-counter for the next 2 days and begin albuterol 2-3 times a day for the next few days for symptoms.  If not much improved in the next 72 hours then let me know.  Verlin was seen today for jaw pain.  Diagnoses and all orders for this visit:  Foot pain, right  Flat feet  Jaw pain  Teeth grinding  History of influenza  Lung field abnormal  Other orders -     tiZANidine (ZANAFLEX) 4 MG tablet; Take 1 tablet (4 mg total) by mouth at bedtime. -     albuterol (VENTOLIN HFA) 108 (90 Base) MCG/ACT inhaler; Inhale 2 puffs into the lungs every 6 (six) hours as needed for wheezing or shortness of breath.    Follow up: prn

## 2023-09-10 ENCOUNTER — Other Ambulatory Visit: Payer: Self-pay | Admitting: Medical

## 2023-10-05 ENCOUNTER — Other Ambulatory Visit: Payer: Self-pay | Admitting: Medical

## 2023-10-05 ENCOUNTER — Telehealth: Payer: Self-pay | Admitting: Medical

## 2023-10-05 ENCOUNTER — Encounter: Payer: Self-pay | Admitting: Internal Medicine

## 2023-10-05 NOTE — Telephone Encounter (Signed)
 Patient is due for a visit. Please call to schedule him for his Diabetes. I refilled this for 30 days

## 2023-10-05 NOTE — Telephone Encounter (Signed)
 Medication Refill -  Most Recent Primary Care Visit:  Provider: Jac Canavan  Department: [facility]  Visit Type: ACUTE 30  Date: 08/12/2023  Medication: Omeprazole 400 mg He is also asking to be started back on the Farsiga  Has the patient contacted their pharmacy? No (Agent: If no, request that the patient contact the pharmacy for the refill. If patient does not wish to contact the pharmacy document the reason why and proceed with request.) (Agent: If yes, when and what did the pharmacy advise?)  Is this the correct pharmacy for this prescription? Yes If no, delete pharmacy and type the correct one.  This is the patient's preferred pharmacy:   [facility] [address]  [place]  Has the prescription been filled recently? No  Is the patient out of the medication? Yes  Has the patient been seen for an appointment in the last year OR does the patient have an upcoming appointment? Yes  Can we respond through MyChart? No  Agent: Please be advised that Rx refills may take up to 3 business days. We ask that you follow-up with your pharmacy.

## 2023-10-06 ENCOUNTER — Other Ambulatory Visit: Payer: Self-pay | Admitting: Medical

## 2023-10-06 MED ORDER — OMEPRAZOLE 40 MG PO CPDR: 40.0000 mg | DELAYED_RELEASE_CAPSULE | Freq: Every day | ORAL | 0 refills | Status: DC

## 2023-10-06 NOTE — Telephone Encounter (Signed)
Tried to call patient but vm not set up.

## 2023-10-07 NOTE — Telephone Encounter (Signed)
 Pt states that he would like to go back on rybelsus 7 or 14mg  instead of being on Jardiance.   He did not mean to say farixga.

## 2023-10-08 ENCOUNTER — Other Ambulatory Visit: Payer: Self-pay | Admitting: Medical

## 2023-10-08 MED ORDER — RYBELSUS 7 MG PO TABS: 7.0000 mg | ORAL_TABLET | Freq: Every day | ORAL | 1 refills | Status: DC

## 2023-10-08 NOTE — Telephone Encounter (Signed)
 Patient states he is willing to risk taking the rybelsus 7mg  again. He know his levels are high but he knows his body and wants to be on rybelsus and jardiance.   When does he need to come in for a visit.

## 2023-10-08 NOTE — Telephone Encounter (Signed)
 Spoke with patient and he scheduled for 11/02/23 and he will bring list of sugars.

## 2023-10-21 ENCOUNTER — Other Ambulatory Visit (HOSPITAL_COMMUNITY): Payer: Self-pay

## 2023-10-21 ENCOUNTER — Telehealth: Payer: Self-pay

## 2023-10-21 NOTE — Telephone Encounter (Signed)
 Pharmacy Patient Advocate Encounter   Received notification from CoverMyMeds that prior authorization for FreeStyle Libre 3 Sensor is required/requested.   Insurance verification completed.   The patient is insured through [facility].   Per test claim: PA required; PA submitted to above mentioned insurance via CoverMyMeds Key/confirmation #/EOC (Key: [id number])  Status is pending

## 2023-10-22 ENCOUNTER — Other Ambulatory Visit (HOSPITAL_COMMUNITY): Payer: Self-pay

## 2023-10-22 NOTE — Telephone Encounter (Signed)
 Pharmacy Patient Advocate Encounter  Received notification from [facility] that Prior Authorization for FreeStyle Libre 3 Sensor has been APPROVED from 4.9.25 to 4.9.26. Ran test claim, Copay is $4.00. This test claim was processed through [facility]- copay amounts may vary at other pharmacies due to pharmacy/plan contracts, or as the patient moves through the different stages of their insurance plan.   PA #/Case ID/Reference #: (Key: [id number])

## 2023-10-23 DIAGNOSIS — H5213 Myopia, bilateral: Secondary | ICD-10-CM | POA: Diagnosis not present

## 2023-11-02 ENCOUNTER — Other Ambulatory Visit: Payer: Self-pay | Admitting: Medical

## 2023-11-02 ENCOUNTER — Ambulatory Visit: Admitting: Medical

## 2023-11-02 ENCOUNTER — Other Ambulatory Visit (HOSPITAL_COMMUNITY): Payer: Self-pay

## 2023-11-02 ENCOUNTER — Telehealth: Payer: Self-pay

## 2023-11-02 VITALS — BP 130/82 | HR 104 | Wt 226.4 lb

## 2023-11-02 DIAGNOSIS — E1159 Type 2 diabetes mellitus with other circulatory complications: Secondary | ICD-10-CM | POA: Diagnosis not present

## 2023-11-02 DIAGNOSIS — E1165 Type 2 diabetes mellitus with hyperglycemia: Secondary | ICD-10-CM | POA: Diagnosis not present

## 2023-11-02 DIAGNOSIS — E785 Hyperlipidemia, unspecified: Secondary | ICD-10-CM

## 2023-11-02 DIAGNOSIS — R809 Proteinuria, unspecified: Secondary | ICD-10-CM | POA: Diagnosis not present

## 2023-11-02 DIAGNOSIS — I152 Hypertension secondary to endocrine disorders: Secondary | ICD-10-CM

## 2023-11-02 DIAGNOSIS — I700 Atherosclerosis of aorta: Secondary | ICD-10-CM | POA: Diagnosis not present

## 2023-11-02 LAB — COMPREHENSIVE METABOLIC PANEL WITH GFR
ALT: 20 IU/L (ref 0–44)
AST: 13 IU/L (ref 0–40)
Albumin: 4.5 g/dL (ref 4.1–5.1)
Alkaline Phosphatase: 72 IU/L (ref 44–121)
BUN/Creatinine Ratio: 14 (ref 9–20)
BUN: 15 mg/dL (ref 6–24)
Bilirubin Total: 0.5 mg/dL (ref 0.0–1.2)
CO2: 23 mmol/L (ref 20–29)
Calcium: 9.7 mg/dL (ref 8.7–10.2)
Chloride: 103 mmol/L (ref 96–106)
Creatinine, Ser: 1.07 mg/dL (ref 0.76–1.27)
Globulin, Total: 2.8 g/dL (ref 1.5–4.5)
Glucose: 143 mg/dL — ABNORMAL HIGH (ref 70–99)
Potassium: 4.5 mmol/L (ref 3.5–5.2)
Sodium: 141 mmol/L (ref 134–144)
Total Protein: 7.3 g/dL (ref 6.0–8.5)
eGFR: 85 mL/min/{1.73_m2} (ref >59)

## 2023-11-02 LAB — HEMOGLOBIN A1C
Est. average glucose Bld gHb Est-mCnc: 192 mg/dL
Hgb A1c MFr Bld: 8.3 % — ABNORMAL HIGH (ref 4.8–5.6)

## 2023-11-02 LAB — LIPID PANEL
Chol/HDL Ratio: 2.7 ratio (ref 0.0–5.0)
Cholesterol, Total: 146 mg/dL (ref 100–199)
HDL: 54 mg/dL (ref >39)
LDL Chol Calc (NIH): 81 mg/dL (ref 0–99)
Triglycerides: 50 mg/dL (ref 0–149)
VLDL Cholesterol Cal: 11 mg/dL (ref 5–40)

## 2023-11-02 MED ORDER — INSULIN LISPRO 100 UNIT/ML IJ SOLN: 10.0000 [IU] | Freq: Three times a day (TID) | INTRAMUSCULAR | 3 refills | Status: DC

## 2023-11-02 MED ORDER — EMPAGLIFLOZIN 10 MG PO TABS
10.0000 mg | ORAL_TABLET | Freq: Every day | ORAL | 2 refills | Status: DC
Start: 2023-11-02 — End: 2024-06-08

## 2023-11-02 MED ORDER — BD PEN NEEDLE NANO U/F 32G X 4 MM MISC: 1.0000 | Freq: Every day | 3 refills | Status: AC

## 2023-11-02 MED ORDER — INSULIN GLARGINE 100 UNIT/ML SOLOSTAR PEN: 15.0000 [IU] | PEN_INJECTOR | Freq: Every day | SUBCUTANEOUS | 3 refills | Status: DC

## 2023-11-02 MED ORDER — FREESTYLE LIBRE 3 PLUS SENSOR MISC: 1 refills | Status: DC

## 2023-11-02 MED ORDER — INSULIN GLARGINE-YFGN 100 UNIT/ML ~~LOC~~ SOPN: 15.0000 [IU] | PEN_INJECTOR | Freq: Every day | SUBCUTANEOUS | 2 refills | Status: DC

## 2023-11-02 NOTE — Telephone Encounter (Signed)
 Pharmacy Patient Advocate Encounter   Received notification from CoverMyMeds that prior authorization for Jardiance  10MG  tablets is required/requested.   Insurance verification completed.   The patient is insured through [facility] MEDICAID.   Per test claim: PA required; PA submitted to above mentioned insurance via CoverMyMeds Key/confirmation #/EOC (Key: [id number])  Status is pending

## 2023-11-02 NOTE — Progress Notes (Signed)
 Subjective:  Dustin Zamora is a 48 y.o. male who presents for Chief Complaint  Patient presents with   Follow-up    Follow-up on Blood sugars. Readings have been high. Would like rhybelus 14mg  instead of 7mg      Here for diabetes follow-up.  He apparently has not been  taking 15 units of Lantus  daily.  Is using Jardiance  10 mg daily, metformin  850 mg twice daily, and just recently started back on Rybelsus  7 mg daily.  He has been using some Humolog a friend gave him for meal time use 1-2 times per day.  Checking glucose every other day.  Seeing 150-170s.   At times in recent weeks seeing some 300s.  He does endorse not eating the healthiest.  Is doing some sweets, lots of starches.   Hyperlipidemia-compliant with rosuvastatin  Crestor  20 mg daily  Hypertension-compliant with olmesartan  20 mg daily  Last alcohol 3 years ago  No other aggravating or relieving factors.    No other c/o.  Past Medical History:  Diagnosis Date   Anxiety    Chronic back pain    Depression    in remote past   Diabetes mellitus without complication (HCC) 2008   Diverticulosis    Hyperlipidemia 2008   Hypertension 2008   Keloid    Microalbuminuria    Smoker    Wears glasses    Current Outpatient Medications on File Prior to Visit  Medication Sig Dispense Refill   acetaminophen  (TYLENOL ) 500 MG tablet Take 2,000 mg by mouth every 6 (six) hours as needed.     albuterol  (VENTOLIN  HFA) 108 (90 Base) MCG/ACT inhaler TAKE 2 PUFFS BY MOUTH EVERY 6 HOURS AS NEEDED FOR WHEEZE OR SHORTNESS OF BREATH 18 each 0   buPROPion  (WELLBUTRIN  XL) 150 MG 24 hr tablet Take 1 tablet (150 mg total) by mouth daily. 90 tablet 3   dicyclomine  (BENTYL ) 10 MG capsule Take 1 capsule (10 mg total) by mouth in the morning, at noon, in the evening, and at bedtime. 90 capsule 0   Insulin  Pen Needle (BD PEN NEEDLE NANO 2ND GEN) 32G X 4 MM MISC 1 AT BEDTIME. 100 each 5   metFORMIN  (GLUCOPHAGE ) 850 MG tablet Take 1 tablet (850 mg total)  by mouth 2 (two) times daily with a meal. 180 tablet 3   NONFORMULARY OR COMPOUNDED ITEM      olmesartan  (BENICAR ) 20 MG tablet TAKE 1 TABLET BY MOUTH EVERY DAY 90 tablet 1   omeprazole  (PRILOSEC) 40 MG capsule Take 1 capsule (40 mg total) by mouth daily. 90 capsule 0   ondansetron  (ZOFRAN -ODT) 4 MG disintegrating tablet Take 1 tablet (4 mg total) by mouth every 8 (eight) hours as needed for nausea or vomiting. 20 tablet 0   rosuvastatin  (CRESTOR ) 20 MG tablet TAKE 1 TABLET BY MOUTH EVERY DAY 90 tablet 1   Semaglutide  (RYBELSUS ) 7 MG TABS Take 1 tablet (7 mg total) by mouth daily. 30 tablet 1   sildenafil  (VIAGRA ) 100 MG tablet TAKE 1 TABLET BY MOUTH DAILY AS NEEDED 30 tablet 1   tiZANidine  (ZANAFLEX ) 4 MG tablet Take 1 tablet (4 mg total) by mouth at bedtime. 20 tablet 0   triamcinolone  cream (KENALOG ) 0.1 % Apply 1 Application topically 2 (two) times daily. 30 g 0   blood glucose meter kit and supplies Dispense based on patient and insurance preference. Test 1-2 times a day 1 each 0   No current facility-administered medications on file prior to visit.  The following portions of the patient's history were reviewed and updated as appropriate: allergies, current medications, past family history, past medical history, past social history, past surgical history and problem list.  ROS Otherwise as in subjective above  Objective: BP 130/82   Pulse (!) 104   Wt 226 lb 6.4 oz (102.7 kg)   BMI 29.46 kg/m   BP Readings from Last 3 Encounters:  11/02/23 130/82  08/12/23 122/80  06/29/23 124/80   Wt Readings from Last 3 Encounters:  11/02/23 226 lb 6.4 oz (102.7 kg)  08/12/23 213 lb 12.8 oz (97 kg)  07/30/23 215 lb (97.5 kg)    General appearance: alert, no distress, well developed, well nourished   Assessment: Encounter Diagnoses  Name Primary?   Uncontrolled type 2 diabetes mellitus with hyperglycemia (HCC) Yes   Microalbuminuria    Hypertension associated with diabetes (HCC)     Hyperlipidemia, unspecified hyperlipidemia type    Aortic atherosclerosis (HCC)      Plan: Hypertension Continue olmesartan  20 mg daily  Diabetes Continue metformin  850 mg twice daily Continue Jardiance  10 mg daily Continue Rybelsus  7 mg daily Begin back on long-acting once daily Lantus  15 units daily.  We thought you are on this already but apparently has not been taking this Continue Humalog  mealtime insulin  per sliding scale Start using the freestyle libre system to better track your sugars  Correction Insulin /Sliding Scale Your caregiver has decided you need insulin  at home. You have been given a correctional scale (sliding scale) in case you need extra insulin  when your blood sugar is too high (hyperglycemia). The following instructions will assist you in how to use that correctional scale.   WHAT IS A CORRECTIONAL SCALE (SLIDING SCALE)?      When you check your blood sugar, sometimes it will be higher than your caregiver wants it to be. You may need an extra dose of insulin  to bring your blood sugar to your desired level (also known as your goal, target level, or normal level.) The correctional scale is prescribed by your caregiver based on your specific needs.    ______________________________________________________________________  INSULIN  SLIDING SCALE   Use the chart below to determine the amount of your Humalog  Insulin  that you will use to control your meal time blood sugar.  If your glucose before meal is less than 60, drink 4 oz of orange juice or if able, eat a piece of candy and do not use the meal time dose of insulin   If your glucose before meal is 60 -100, don't use the meal time insulin  for this meal If your glucose before meal is 101-150, use  3  units of Insulin   If your glucose before meal is 151-200, use  6  units of Insulin   If your glucose before meal is 201-250, use  9  units of Insulin  If your glucose before meal is 251-300, use  12  units of  Insulin  If your glucose before meal is 301-350, use  15  units of Insulin  If your glucose before meal is 351-400, use  18  units of Insulin  If your glucose before meal is 451-500, use  22  units of Insulin  If your glucose before meal is >500, use 30 units of Insulin  and call doctor immediately  ________________________________________________________________________     WHY IS IT IMPORTANT TO KEEP YOUR BLOOD SUGAR LEVELS AT YOUR DESIRED LEVEL?     It helps to prevent long-term complications of diabetes, such as eye disease, kidney failure, and other  serious complications. WHAT TYPE OF INSULIN  WILL YOU USE?   To help bring down blood sugars that are too high, your caregiver has prescribed a short-acting or a rapid-acting insulin . An example of a short-acting insulin  would be Regular.  WHAT DO I NEED TO DO?   Check your blood sugar with your home blood glucose meter as recommended by your caregiver. Using your correctional scale, find the range your blood sugar lies in.  Look for the units of insulin  that matches the blood sugar range. Give yourself the dose of correctional insulin  your caregiver has prescribed. Always make sure you are using the right type of insulin .  Prior to the injection make sure you have food available that you can eat in the next 15 to 30 minutes. If your correctional insulin  is rapid acting, start eating your meal within 15 minutes after you have given yourself the insulin  injection. If you wait longer than 15 minutes to eat, your blood sugar might get too low. If your correctional insulin  is short acting (Regular), start eating your meal within 30 minutes after you have given yourself the insulin  injection. If you wait longer than 30 minutes to eat, your blood sugar might get too low. Symptoms of low blood sugar (hypoglycemia) may include feeling shaky or weak, sweating a lot, not thinking straight, difficulty seeing, agitation, or crankiness. Check your blood sugar  immediately and treat your results as directed by your caregiver.  Keep a log of your blood sugar results with the time you took the test and the amount of insulin  that you injected. This information will help your caregiver manage your medications. Note on your log anything that may affect your blood sugars such as: Changes in normal exercise or activity. Changes in your normal schedule, such as staying up late, going on vacation, changing your diet, or holidays. New medications. This includes all medications. Some medications, even those that do not require a prescription, may cause high blood sugars. Illness or stress. Changes in when you actually took your medication. Changes in your meals, such as skipping a meal, a late meal, or dining out. Eating things that may affect blood glucose, such as snacks, larger meal portions than normal, or drinks with sugar.  Ask your caregiver any questions you have.  Microalbuminuria Improved as of the last labs 6 months ago.  His numbers have been fairly controlled over the last several years with better control of blood pressure and to some extent sugars  Hyperlipidemia Continue rosuvastatin  Crestor  20 mg daily Labs today.  Last lipid panel at goal May 2024   Dustin Zamora was seen today for follow-up.  Diagnoses and all orders for this visit:  Uncontrolled type 2 diabetes mellitus with hyperglycemia (HCC) -     Hemoglobin A1c -     Comprehensive metabolic panel with GFR  Microalbuminuria  Hypertension associated with diabetes (HCC) -     Comprehensive metabolic panel with GFR  Hyperlipidemia, unspecified hyperlipidemia type -     Lipid panel  Aortic atherosclerosis (HCC)  Other orders -     Continuous Glucose Sensor (FREESTYLE LIBRE 3 PLUS SENSOR) MISC; Change sensor every 15 days. -     empagliflozin  (JARDIANCE ) 10 MG TABS tablet; Take 1 tablet (10 mg total) by mouth daily before breakfast. -     insulin  glargine (LANTUS ) 100 UNIT/ML Solostar  Pen; Inject 15 Units into the skin daily. -     Insulin  Pen Needle (BD PEN NEEDLE NANO U/F) 32G X 4  MM MISC; 1 each by Does not apply route at bedtime. -     insulin  lispro (HUMALOG ) 100 UNIT/ML injection; Inject 0.1 mLs (10 Units total) into the skin 3 (three) times daily before meals.  Spent > 30 minutes face to face with patient in discussion of symptoms, evaluation, plan and recommendations.    Follow up: pending labs

## 2023-11-02 NOTE — Patient Instructions (Signed)
 Hypertension Continue olmesartan  20 mg daily  Diabetes Continue metformin  850 mg twice daily Continue Jardiance  10 mg daily Continue Rybelsus  7 mg daily Begin back on long-acting once daily Lantus  15 units daily.  We thought you are on this already but apparently has not been taking this Continue Humalog  mealtime insulin  per sliding scale Start using the freestyle libre system to better track your sugars  Correction Insulin /Sliding Scale Your caregiver has decided you need insulin  at home. You have been given a correctional scale (sliding scale) in case you need extra insulin  when your blood sugar is too high (hyperglycemia). The following instructions will assist you in how to use that correctional scale.   WHAT IS A CORRECTIONAL SCALE (SLIDING SCALE)?      When you check your blood sugar, sometimes it will be higher than your caregiver wants it to be. You may need an extra dose of insulin  to bring your blood sugar to your desired level (also known as your goal, target level, or normal level.) The correctional scale is prescribed by your caregiver based on your specific needs.    ______________________________________________________________________  INSULIN  SLIDING SCALE   Use the chart below to determine the amount of your Humalog  Insulin  that you will use to control your meal time blood sugar.  If your glucose before meal is less than 60, drink 4 oz of orange juice or if able, eat a piece of candy and do not use the meal time dose of insulin   If your glucose before meal is 60 -100, don't use the meal time insulin  for this meal If your glucose before meal is 101-150, use  3  units of Insulin   If your glucose before meal is 151-200, use  6  units of Insulin   If your glucose before meal is 201-250, use  9  units of Insulin  If your glucose before meal is 251-300, use  12  units of Insulin  If your glucose before meal is 301-350, use  15  units of Insulin  If your glucose before meal is  351-400, use  18  units of Insulin  If your glucose before meal is 451-500, use  22  units of Insulin  If your glucose before meal is >500, use 30 units of Insulin  and call doctor immediately  ________________________________________________________________________     WHY IS IT IMPORTANT TO KEEP YOUR BLOOD SUGAR LEVELS AT YOUR DESIRED LEVEL?     It helps to prevent long-term complications of diabetes, such as eye disease, kidney failure, and other serious complications. WHAT TYPE OF INSULIN  WILL YOU USE?   To help bring down blood sugars that are too high, your caregiver has prescribed a short-acting or a rapid-acting insulin . An example of a short-acting insulin  would be Regular.  WHAT DO I NEED TO DO?   Check your blood sugar with your home blood glucose meter as recommended by your caregiver. Using your correctional scale, find the range your blood sugar lies in.  Look for the units of insulin  that matches the blood sugar range. Give yourself the dose of correctional insulin  your caregiver has prescribed. Always make sure you are using the right type of insulin .  Prior to the injection make sure you have food available that you can eat in the next 15 to 30 minutes. If your correctional insulin  is rapid acting, start eating your meal within 15 minutes after you have given yourself the insulin  injection. If you wait longer than 15 minutes to eat, your blood sugar might get too  low. If your correctional insulin  is short acting (Regular), start eating your meal within 30 minutes after you have given yourself the insulin  injection. If you wait longer than 30 minutes to eat, your blood sugar might get too low. Symptoms of low blood sugar (hypoglycemia) may include feeling shaky or weak, sweating a lot, not thinking straight, difficulty seeing, agitation, or crankiness. Check your blood sugar immediately and treat your results as directed by your caregiver.  Keep a log of your blood sugar results with  the time you took the test and the amount of insulin  that you injected. This information will help your caregiver manage your medications. Note on your log anything that may affect your blood sugars such as: Changes in normal exercise or activity. Changes in your normal schedule, such as staying up late, going on vacation, changing your diet, or holidays. New medications. This includes all medications. Some medications, even those that do not require a prescription, may cause high blood sugars. Illness or stress. Changes in when you actually took your medication. Changes in your meals, such as skipping a meal, a late meal, or dining out. Eating things that may affect blood glucose, such as snacks, larger meal portions than normal, or drinks with sugar.  Ask your caregiver any questions you have.  Microalbuminuria Improved as of the last labs 6 months ago.  His numbers have been fairly controlled over the last several years with better control of blood pressure and to some extent sugars  Hyperlipidemia Continue rosuvastatin  Crestor  20 mg daily Labs today.  Last lipid panel at goal May 2024

## 2023-11-03 ENCOUNTER — Other Ambulatory Visit (HOSPITAL_COMMUNITY): Payer: Self-pay

## 2023-11-03 ENCOUNTER — Other Ambulatory Visit: Payer: Self-pay | Admitting: Medical

## 2023-11-03 MED ORDER — RYBELSUS 7 MG PO TABS: 7.0000 mg | ORAL_TABLET | Freq: Every day | ORAL | 1 refills | Status: DC

## 2023-11-03 MED ORDER — OLMESARTAN MEDOXOMIL 20 MG PO TABS: 20.0000 mg | ORAL_TABLET | Freq: Every day | ORAL | 2 refills | Status: DC

## 2023-11-03 NOTE — Telephone Encounter (Signed)
 Pharmacy Patient Advocate Encounter  Received notification from  [employer].  that Prior Authorization for Jardiance  10MG  tablets has been APPROVED from 4.21.25 to 4.21.26. Ran test claim, Copay is $RTS, RX FILLABLE AGAIN ON/AFTER 7.2.25. This test claim was processed through [facility]- copay amounts may vary at other pharmacies due to pharmacy/plan contracts, or as the patient moves through the different stages of their insurance plan.   PA #/Case ID/Reference #: (Key: [id number])

## 2023-11-03 NOTE — Progress Notes (Signed)
 Results sent through MyChart

## 2023-11-23 ENCOUNTER — Ambulatory Visit: Payer: Self-pay

## 2023-11-23 ENCOUNTER — Other Ambulatory Visit: Payer: Self-pay | Admitting: Internal Medicine

## 2023-11-23 MED ORDER — FREESTYLE LIBRE 3 PLUS SENSOR MISC: 1 refills | Status: DC

## 2023-11-23 NOTE — Addendum Note (Signed)
 Addended by: Charliene Conte A on: 11/23/2023 10:48 AM   Modules accepted: Orders

## 2023-11-23 NOTE — Telephone Encounter (Signed)
 Copied from CRM [id number]. Topic: Clinical - Medical Advice >> Nov 23, 2023  9:32 AM Dustin Zamora wrote: Reason for CRM: Patient continuous glucose sensor(freestyle libre 3 plus sensor ) has been malfunctioning since 11/22/23  Chief Complaint: Device malfunction Symptoms: False elevated blood glucose Frequency: Yesterday Pertinent Negatives: Patient denies symptoms Disposition: [] ED /[] Urgent Care (no appt availability in office) / [] Appointment(In office/virtual)/ []  [facility]/ [] Home Care/ [] Refused Recommended Disposition /[] [facility]/ [x]  Follow-up with PCP Additional Notes: Patient called in to report that his freestyle libre 3 is malfunctioning. Patient stated the device gave an abnormally high reading of 350 yesterday. Patient stated he took insulin  to compensate and the device was still giving high readings. Patient stated he started feeling "shaky" and cross-checked his glucose with his old monitor. Patient stated the reading was 68. Patient stated he ate spaghetti and his glucose came back up and symptoms resolved. Patient stated she does not have symptoms at this time and can continue to use his old device to safely monitor his blood glucose. Patient is requesting a new freestyle libre 3 device. Patient would also like to know if this would be covered by insurance, as the original device malfunctioned. Please advise.  Reason for Disposition  [1] Caller has NON-URGENT medication or insulin  pump question AND [2] triager unable to answer question  Answer Assessment - Initial Assessment Questions 1. BLOOD GLUCOSE: "What is your blood glucose level?"      States device told him his glucose was 350 and he took insulin  to compensate and it dropped his glucose down to 68 2. ONSET: "When did you check the blood glucose?"     First noticed device malfunctioning yesterday  3. USUAL RANGE: "What is your glucose level usually?" (e.g., usual fasting morning value, usual  evening value)     "Around 133" 5. TYPE 1 or 2:  "Do you know what type of diabetes you have?"  (e.g., Type 1, Type 2, Gestational; doesn't know)      Type 2 6. INSULIN : "Do you take insulin ?" "What type of insulin (s) do you use? What is the mode of delivery? (syringe, pen; injection or pump)?"      Humalog  and semglee  7. DIABETES PILLS: "Do you take any pills for your diabetes?" If Yes, ask: "Have you missed taking any pills recently?"     Metformin  8. OTHER SYMPTOMS: "Do you have any symptoms?" (e.g., fever, frequent urination, difficulty breathing, dizziness, weakness, vomiting)     "Shakes" last night when sugar was low last night, denies additional symptoms at this time States he recently switched to using [place] 3, states he went back to his old device which is working "perfectly fine"  Protocols used: Diabetes - High Blood Sugar-A-AH

## 2023-12-17 ENCOUNTER — Telehealth: Payer: Self-pay

## 2023-12-17 NOTE — Telephone Encounter (Signed)
 Pharmacy Patient Advocate Encounter   Received notification from Cover[employer]eds that prior authorization for Rybelsus  7MG  tablets is required/requested.   Insurance verification completed.   The patient is insured through [facility] .   Per test claim: PA required; PA submitted to above mentioned insurance via CoverMyMeds Key/confirmation #/EOC (Key: [id number])   Status is pending

## 2023-12-18 ENCOUNTER — Other Ambulatory Visit (HOSPITAL_COMMUNITY): Payer: Self-pay

## 2023-12-18 NOTE — Telephone Encounter (Signed)
 Pharmacy Patient Advocate Encounter  Received notification from [employer] that Prior Authorization for Rybelsus  7MG  tablets has been APPROVED from 6.5.25 to 6.5.26. Ran test claim, Copay is $RTS, RX WAS LAST FILLED ON 5.12.25. This test claim was processed through [facility]- copay amounts may vary at other pharmacies due to pharmacy/plan contracts, or as the patient moves through the different stages of their insurance plan.   PA #/Case ID/Reference #: (KeyGuinevere Lefevre)

## 2023-12-21 ENCOUNTER — Ambulatory Visit: Payer: Self-pay | Admitting: *Deleted

## 2023-12-21 NOTE — Telephone Encounter (Signed)
 Please schedule
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
## 2023-12-22 ENCOUNTER — Telehealth: Admitting: Medical

## 2023-12-22 ENCOUNTER — Ambulatory Visit: Admitting: Medical

## 2023-12-22 VITALS — BP 138/84 | HR 64 | Temp 97.4°F | Wt 231.8 lb

## 2023-12-22 DIAGNOSIS — Z23 Encounter for immunization: Secondary | ICD-10-CM | POA: Diagnosis not present

## 2023-12-22 DIAGNOSIS — W540XXA Bitten by dog, initial encounter: Secondary | ICD-10-CM

## 2023-12-22 DIAGNOSIS — M25532 Pain in left wrist: Secondary | ICD-10-CM | POA: Diagnosis not present

## 2023-12-22 DIAGNOSIS — T148XXA Other injury of unspecified body region, initial encounter: Secondary | ICD-10-CM | POA: Diagnosis not present

## 2023-12-22 MED ORDER — HYDROCODONE-ACETAMINOPHEN 5-325 MG PO TABS: 1.0000 | ORAL_TABLET | Freq: Four times a day (QID) | ORAL | 0 refills | Status: DC | PRN

## 2023-12-22 MED ORDER — CLINDAMYCIN HCL 300 MG PO CAPS
300.0000 mg | ORAL_CAPSULE | Freq: Three times a day (TID) | ORAL | 0 refills | Status: DC
Start: 2023-12-22 — End: 2024-06-07

## 2023-12-22 MED ORDER — DOXYCYCLINE HYCLATE 100 MG PO TABS: 100.0000 mg | ORAL_TABLET | Freq: Two times a day (BID) | ORAL | 0 refills | Status: DC

## 2023-12-22 NOTE — Progress Notes (Signed)
 Subjective:  Dustin Zamora is a 48 y.o. male who presents for Chief Complaint  Patient presents with   Animal Bite    Dog bite happen Saturday. Red and pain and going up his hand.      Here for skin wound.  He has 2 dogs at home and they were going at each other on Saturday.    He went to grab one of the dogs away from the other when accidentally 1 dog accidentally bit him on the wrist.  He had little bit of discomfort that day but by the next day had a little bit more discomfort and started a little bit pink coloration around the left wrist.  Yesterday and today has gotten worse with pain and little swelling.  No drainage or pus.  No fever.  No bodyaches or chills  Date of injury 12/19/23  Today he notes soreness, mild to moderate pain at times, little bit of redness.  No other aggravating or relieving factors.    No other c/o.  Past Medical History:  Diagnosis Date   Anxiety    Chronic back pain    Depression    in remote past   Diabetes mellitus without complication (HCC) 2008   Diverticulosis    Hyperlipidemia 2008   Hypertension 2008   Keloid    Microalbuminuria    Smoker    Wears glasses    Current Outpatient Medications on File Prior to Visit  Medication Sig Dispense Refill   acetaminophen  (TYLENOL ) 500 MG tablet Take 2,000 mg by mouth every 6 (six) hours as needed.     albuterol  (VENTOLIN  HFA) 108 (90 Base) MCG/ACT inhaler TAKE 2 PUFFS BY MOUTH EVERY 6 HOURS AS NEEDED FOR WHEEZE OR SHORTNESS OF BREATH 18 each 0   buPROPion  (WELLBUTRIN  XL) 150 MG 24 hr tablet Take 1 tablet (150 mg total) by mouth daily. 90 tablet 3   dicyclomine  (BENTYL ) 10 MG capsule Take 1 capsule (10 mg total) by mouth in the morning, at noon, in the evening, and at bedtime. 90 capsule 0   empagliflozin  (JARDIANCE ) 10 MG TABS tablet Take 1 tablet (10 mg total) by mouth daily before breakfast. 90 tablet 2   insulin  glargine-yfgn (SEMGLEE ) 100 UNIT/ML Pen Inject 15 Units into the skin at bedtime. 3 mL 2    insulin  lispro (HUMALOG ) 100 UNIT/ML injection Inject 0.1 mLs (10 Units total) into the skin 3 (three) times daily before meals. 10 mL 3   metFORMIN  (GLUCOPHAGE ) 850 MG tablet Take 1 tablet (850 mg total) by mouth 2 (two) times daily with a meal. 180 tablet 3   NONFORMULARY OR COMPOUNDED ITEM      olmesartan  (BENICAR ) 20 MG tablet Take 1 tablet (20 mg total) by mouth daily. 90 tablet 2   omeprazole  (PRILOSEC) 40 MG capsule Take 1 capsule (40 mg total) by mouth daily. 90 capsule 0   ondansetron  (ZOFRAN -ODT) 4 MG disintegrating tablet Take 1 tablet (4 mg total) by mouth every 8 (eight) hours as needed for nausea or vomiting. 20 tablet 0   rosuvastatin  (CRESTOR ) 20 MG tablet TAKE 1 TABLET BY MOUTH EVERY DAY 90 tablet 1   Semaglutide  (RYBELSUS ) 7 MG TABS Take 1 tablet (7 mg total) by mouth daily. 90 tablet 1   tiZANidine  (ZANAFLEX ) 4 MG tablet Take 1 tablet (4 mg total) by mouth at bedtime. 20 tablet 0   triamcinolone  cream (KENALOG ) 0.1 % Apply 1 Application topically 2 (two) times daily. 30 g 0  blood glucose meter kit and supplies Dispense based on patient and insurance preference. Test 1-2 times a day 1 each 0   Continuous Glucose Sensor (FREESTYLE LIBRE 3 PLUS SENSOR) MISC Change sensor every 15 days. 2 each 1   Insulin  Pen Needle (BD PEN NEEDLE NANO 2ND GEN) 32G X 4 MM MISC 1 AT BEDTIME. 100 each 5   Insulin  Pen Needle (BD PEN NEEDLE NANO U/F) 32G X 4 MM MISC 1 each by Does not apply route at bedtime. 100 each 3   sildenafil  (VIAGRA ) 100 MG tablet TAKE 1 TABLET BY MOUTH DAILY AS NEEDED 30 tablet 1   No current facility-administered medications on file prior to visit.     The following portions of the patient's history were reviewed and updated as appropriate: allergies, current medications, past family history, past medical history, past social history, past surgical history and problem list.  ROS Otherwise as in subjective above  Objective: BP 138/84   Pulse 64   Temp (!) 97.4 F  (36.3 C)   Wt 231 lb 12.8 oz (105.1 kg)   BMI 30.17 kg/m   General appearance: alert, no distress, well developed, well nourished Left anterior wrist with a small break in the skin and about 2-1/2 cm diameter area of pinkish coloration around the break in the skin.  There is a reddish coloration at the base of the thumb anteriorly but no obvious puncture wound.  There is no pus or drainage.  There is no fluctuance warmth or induration.  Wrist range of motion is only slightly reduced with little bit of pain, but otherwise range of motion is relatively full.  Finger and elbow range of motion is normal.  Hand and fingers otherwise nontender.  Forearm radius and ulna nontender.  No swelling or discoloration of forearm radius and ulna.  There is mainly tenderness right at the medial wrist where the pinkish coloration is located. Arm neurovascularly intact otherwise   Assessment: Encounter Diagnoses  Name Primary?   Wound of skin Yes   Wrist pain, acute, left    Dog bite, initial encounter      Plan: We discussed findings, exam findings, concerns and recommendations.  He is penicillin allergic.  We updated his Tdap today.  Counseled on the Tdap (tetanus, diptheria, and acellular pertussis) vaccine.  Vaccine information sheet given. Tdap vaccine given after consent obtained.  We discussed the following recommendations and treatment and follow-up  Recommendations: We updated your Tdap tetanus booster today Begin combination of 2 antibiotics, clindamycin  3 times a day and doxycycline twice daily for the next week Rest the arm.  You can use an arm sling and wrist splint to support the arm to avoid moving it for the next few days.  You can use the arm sling for an hour or 2 at a time but then take your arm out to move the shoulder periodically. You can use some warm compresses to help with the infection of the wrist You can use Tylenol  for pain or the hydrocodone  pain medicine I prescribed today  for worse pain.  Caution with sedation and constipation Take a marker or pen at home and outlined the area of swelling and redness currently If the redness and swelling is getting worse in the next 48 to 72 hours, along with pain and less motion of the wrist, then you will need to go to the hospital emergency department An animal bite can see bacteria under the skin and if this gets to a  joint, which would be accompanied with worse pain swelling and redness and decreased range of motion, this could possibly need surgery and debridement in the operating room So if any significant worsening in the next 48 to 72 hours, then go to the emergency department You may also want to take some Probiotic such as Restora while on the antibiotics to help with gut health  If any changes today, there is also an after-hours urgent care for orthopedics at [facility] orthopedics and [facility] location for [facility] health   Wayde was seen today for animal bite.  Diagnoses and all orders for this visit:  Wound of skin -     Tdap vaccine greater than or equal to 7yo IM  Wrist pain, acute, left  Dog bite, initial encounter -     Tdap vaccine greater than or equal to 7yo IM  Other orders -     doxycycline (VIBRA-TABS) 100 MG tablet; Take 1 tablet (100 mg total) by mouth 2 (two) times daily. -     clindamycin  (CLEOCIN ) 300 MG capsule; Take 1 capsule (300 mg total) by mouth 3 (three) times daily. -     HYDROcodone -acetaminophen  (NORCO/VICODIN) 5-325 MG tablet; Take 1 tablet by mouth every 6 (six) hours as needed for moderate pain (pain score 4-6).    Follow up: 2-3 days

## 2023-12-22 NOTE — Patient Instructions (Signed)
 Recommendations: We updated your Tdap tetanus booster today Begin combination of 2 antibiotics, clindamycin  3 times a day and doxycycline twice daily for the next week Rest the arm.  You can use an arm sling and wrist splint to support the arm to avoid moving it for the next few days.  You can use the arm sling for an hour or 2 at a time but then take your arm out to move the shoulder periodically. You can use some warm compresses to help with the infection of the wrist You can use Tylenol  for pain or the hydrocodone  pain medicine I prescribed today for worse pain.  Caution with sedation and constipation Take a marker or pen at home and outlined the area of swelling and redness currently If the redness and swelling is getting worse in the next 48 to 72 hours, along with pain and less motion of the wrist, then you will need to go to the hospital emergency department An animal bite can see bacteria under the skin and if this gets to a joint, which would be accompanied with worse pain swelling and redness and decreased range of motion, this could possibly need surgery and debridement in the operating room So if any significant worsening in the next 48 to 72 hours, then go to the emergency department You may also want to take some Probiotic such as Restora while on the antibiotics to help with gut health  If any changes today, there is also an after-hours urgent care for orthopedics at [facility] orthopedics and [facility] location for [facility] health

## 2023-12-23 DIAGNOSIS — M25539 Pain in unspecified wrist: Secondary | ICD-10-CM | POA: Diagnosis not present

## 2023-12-23 DIAGNOSIS — M25532 Pain in left wrist: Secondary | ICD-10-CM | POA: Diagnosis not present

## 2024-01-06 ENCOUNTER — Other Ambulatory Visit (HOSPITAL_COMMUNITY): Payer: Self-pay

## 2024-01-06 ENCOUNTER — Telehealth: Payer: Self-pay

## 2024-01-06 NOTE — Telephone Encounter (Signed)
 Copied from CRM [id number]. Topic: Clinical - Prescription Issue >> Jan 06, 2024 12:43 PM Carlatta H wrote: Reason for CRM: Patients prescription for insulin  glargine-yfgn (SEMGLEE ) 100 UNIT/ML Pen [[id number]] needs to be sent for prior authorization

## 2024-01-06 NOTE — Telephone Encounter (Signed)
    Per t/c lantus  Solostar is Medicaid Pref'd drug and is covered with no P/a needed with a 4.00 copay    Pref'd drug list

## 2024-01-07 ENCOUNTER — Other Ambulatory Visit: Payer: Self-pay | Admitting: Medical

## 2024-01-08 ENCOUNTER — Other Ambulatory Visit: Payer: Self-pay | Admitting: Medical

## 2024-01-08 MED ORDER — LANTUS SOLOSTAR 100 UNIT/ML ~~LOC~~ SOPN: 15.0000 [IU] | PEN_INJECTOR | Freq: Every day | SUBCUTANEOUS | 2 refills | Status: DC

## 2024-01-08 NOTE — Telephone Encounter (Signed)
 Does he need to remain on this medication?

## 2024-01-11 ENCOUNTER — Other Ambulatory Visit: Payer: Self-pay | Admitting: Medical

## 2024-01-11 DIAGNOSIS — N529 Male erectile dysfunction, unspecified: Secondary | ICD-10-CM

## 2024-01-28 ENCOUNTER — Other Ambulatory Visit: Payer: Self-pay | Admitting: Medical

## 2024-01-31 ENCOUNTER — Other Ambulatory Visit: Payer: Self-pay | Admitting: Medical

## 2024-02-02 ENCOUNTER — Other Ambulatory Visit: Payer: Self-pay | Admitting: Medical

## 2024-02-02 NOTE — Telephone Encounter (Signed)
 Is this okay to refill?

## 2024-02-05 ENCOUNTER — Other Ambulatory Visit: Payer: Self-pay | Admitting: Medical

## 2024-03-11 ENCOUNTER — Other Ambulatory Visit: Payer: Self-pay | Admitting: Medical

## 2024-03-11 DIAGNOSIS — N529 Male erectile dysfunction, unspecified: Secondary | ICD-10-CM

## 2024-04-18 ENCOUNTER — Telehealth: Admitting: Family Medicine

## 2024-04-18 ENCOUNTER — Encounter: Payer: Self-pay | Admitting: Family Medicine

## 2024-04-18 DIAGNOSIS — J069 Acute upper respiratory infection, unspecified: Secondary | ICD-10-CM | POA: Diagnosis not present

## 2024-04-18 MED ORDER — METHYLPREDNISOLONE 4 MG PO TBPK: ORAL_TABLET | ORAL | 0 refills | Status: DC

## 2024-04-18 NOTE — Progress Notes (Signed)
   Name: Dustin Zamora   Date of Visit: 04/18/24   Date of last visit with me: Visit date not found   CHIEF COMPLAINT:  Chief Complaint  Patient presents with   Acute Visit    Cold symptoms.      Telehealth visit.  Both patient and physician are located in the state [place] .  HPI:  Discussed the use of AI scribe software for clinical note transcription with the patient, who gave verbal consent to proceed.  History of Present Illness Dustin Zamora is a 48 year old male who presents with sore throat, nasal congestion, and cough.  He began experiencing symptoms yesterday, including a sore throat, nasal congestion, and mucus in his throat, which he describes as 'bad'. His nose is also running.  He denies muscle aches but mentions a transient sensation of his body heating up, although this was not confirmed by others.  He has a headache and a cough, with the cough being more pronounced in the morning, waking him from sleep. The cough remains consistent throughout the day without improvement.  He received steroid shots approximately a week ago, which initially raised his blood sugar levels, but they have since returned to normal.     OBJECTIVE:       04/27/2023    9:12 AM  Depression screen PHQ 2/9  Decreased Interest 0  Down, Depressed, Hopeless 0  PHQ - 2 Score 0     BP Readings from Last 3 Encounters:  12/22/23 138/84  11/02/23 130/82  08/12/23 122/80    There were no vitals taken for this visit.   Physical Exam    Physical Exam  Exam limited by telehealth capabilities.  Patient appears to be in no acute distress and is answering questions comfortably. ASSESSMENT/PLAN:   Assessment & Plan Viral URI with cough    Assessment and Plan Assessment & Plan Acute upper respiratory infection with cough and sore throat Acute onset of upper respiratory symptoms including sore throat, nasal congestion, and cough. Possible mild fever noted. - Prescribed  short course of steroids to [facility] on [address], caution advised due to potential impact on blood sugar. - Recommended alternating acetaminophen  and ibuprofen  for headache. - Advised use of Flonase nasal spray for nasal congestion.  Type 2 diabetes mellitus without complications Type 2 diabetes mellitus well-controlled. Recent steroid injection caused temporary blood sugar increase, now normalized. Short course of steroids may temporarily raise blood sugar. - Monitor blood sugar levels, especially during steroid treatment for respiratory infection.     Cecilia Nishikawa A. Vita MD [facility]

## 2024-04-20 ENCOUNTER — Ambulatory Visit: Admitting: Medical

## 2024-04-20 ENCOUNTER — Ambulatory Visit: Payer: Self-pay

## 2024-04-20 NOTE — Telephone Encounter (Signed)
 FYI Only or Action Required?: Action required by provider: request for appointment.  Patient was last seen in primary care on 04/18/2024 by Vita Morrow, MD.  Called Nurse Triage reporting Cough.  Symptoms began a week ago.  Interventions attempted: Prescription medications: Medrol dose pack.  Symptoms are: gradually worsening.  Triage Disposition: See Physician Within 24 Hours  Patient/caregiver understands and will follow disposition?: YesCopied from CRM #[id number]. Topic: Clinical - Red Word Triage >> Apr 20, 2024 10:22 AM Treva T wrote: [facility] that prompted transfer to Nurse Triage: Patient calling, states he was seen in office recently and was prescribed steroids. However he has a worsening cough, causing chest to hurt. Reason for Disposition  SEVERE coughing spells (e.g., whooping sound after coughing, vomiting after coughing)  Answer Assessment - Initial Assessment Questions Cough has worsened since virtual appt. My chest hurts from all the coughing. Pt denies any breathing difficulty.      1. ONSET: When did the cough begin?      Last week  2. SEVERITY: How bad is the cough today?      Chest hurts from coughing  3. SPUTUM: Describe the color of your sputum (e.g., none, dry cough; clear, white, yellow, green)     clear 4. HEMOPTYSIS: Are you coughing up any blood? If Yes, ask: How much? (e.g., flecks, streaks, tablespoons, etc.)     na 5. DIFFICULTY BREATHING: Are you having difficulty breathing? If Yes, ask: How bad is it? (e.g., mild, moderate, severe)      denies 6. FEVER: Do you have a fever? If Yes, ask: What is your temperature, how was it measured, and when did it start?     denies 7. CARDIAC HISTORY: Do you have any history of heart disease? (e.g., heart attack, congestive heart failure)      denies 8. LUNG HISTORY: Do you have any history of lung disease?  (e.g., pulmonary embolus, asthma, emphysema)     denies 9. PE RISK FACTORS: Do you  have a history of blood clots? (or: recent major surgery, recent prolonged travel, bedridden)     na 10. OTHER SYMPTOMS: Do you have any other symptoms? (e.g., runny nose, wheezing, chest pain)       Sore throat, nasal congestion, lightheaded  Protocols used: Cough - Acute Productive-A-AH

## 2024-06-07 ENCOUNTER — Ambulatory Visit (INDEPENDENT_AMBULATORY_CARE_PROVIDER_SITE_OTHER): Admitting: Medical

## 2024-06-07 VITALS — BP 132/80 | HR 84 | Wt 242.0 lb

## 2024-06-07 DIAGNOSIS — I152 Hypertension secondary to endocrine disorders: Secondary | ICD-10-CM

## 2024-06-07 DIAGNOSIS — Z794 Long term (current) use of insulin: Secondary | ICD-10-CM

## 2024-06-07 DIAGNOSIS — E1165 Type 2 diabetes mellitus with hyperglycemia: Secondary | ICD-10-CM | POA: Diagnosis not present

## 2024-06-07 DIAGNOSIS — E785 Hyperlipidemia, unspecified: Secondary | ICD-10-CM | POA: Diagnosis not present

## 2024-06-07 DIAGNOSIS — E1159 Type 2 diabetes mellitus with other circulatory complications: Secondary | ICD-10-CM

## 2024-06-07 DIAGNOSIS — Z125 Encounter for screening for malignant neoplasm of prostate: Secondary | ICD-10-CM | POA: Diagnosis not present

## 2024-06-07 DIAGNOSIS — I700 Atherosclerosis of aorta: Secondary | ICD-10-CM

## 2024-06-07 DIAGNOSIS — F411 Generalized anxiety disorder: Secondary | ICD-10-CM

## 2024-06-07 DIAGNOSIS — R809 Proteinuria, unspecified: Secondary | ICD-10-CM

## 2024-06-07 NOTE — Progress Notes (Signed)
 Subjective:  Dustin Zamora is a 48 y.o. male who presents for Chief Complaint  Patient presents with   Medical Management of Chronic Issues    Diabetes, urine will be smelly in the morning mostly. Will wait on shots due to thanksgiving. Had eye exam done at [facility] in [place] [facility]. Will request records   Dustin Zamora is a 48 year old male with diabetes who presents for a medication check.  He is using a Freestyle glucose monitoring system, with average glucose readings of 149 over the past seven days, 154 over the past 14 days, and 160 over the past 30 days. His estimated A1c is 6.9, based on his current glucose monitoring data. He experiences low glucose events, particularly at night or early morning.  His current diabetes medications include Lantus  20 units, Jardiance  10 mg, Humalog  10 units with meals, and Metformin  850 mg twice a day. He previously used Rybelsus  but discontinued it due to feeling overwhelmed by the number of medications. He independently increased his Lantus  dose from 15 to 20 units. He has a history of pancreatitis and is cautious about medications that might exacerbate this condition. He is concerned about potential side effects of his medications.  He reports recent weight gain, which he attributes to insulin  use and dietary habits. He notes a change in the smell of his urine, which he associates with his current diet and medication regimen, particularly Jardiance , which increases glucose excretion through urine.   Hyperlipidemia-compliant with rosuvastatin  Crestor  20 mg daily  Hypertension-compliant with olmesartan  20 mg daily  History of depression and anxiety-he continues on Wellbutrin  150 mg XL daily  No other aggravating or relieving factors.    No other c/o.  Past Medical History:  Diagnosis Date   Anxiety    Chronic back pain    Depression    in remote past   Diabetes mellitus without complication (HCC) 2008   Diverticulosis    Hyperlipidemia 2008    Hypertension 2008   Keloid    Microalbuminuria    Smoker    Wears glasses    Current Outpatient Medications on File Prior to Visit  Medication Sig Dispense Refill   acetaminophen  (TYLENOL ) 500 MG tablet Take 2,000 mg by mouth every 6 (six) hours as needed.     buPROPion  (WELLBUTRIN  XL) 150 MG 24 hr tablet Take 1 tablet (150 mg total) by mouth daily. 90 tablet 3   dicyclomine  (BENTYL ) 10 MG capsule TAKE 1 CAPSULE BY MOUTH 4 TIMES A DAY (MORNING, NOON, EVENING, BEDTIME) 90 capsule 0   empagliflozin  (JARDIANCE ) 10 MG TABS tablet Take 1 tablet (10 mg total) by mouth daily before breakfast. 90 tablet 2   insulin  glargine (LANTUS  SOLOSTAR) 100 UNIT/ML Solostar Pen Inject 15 Units into the skin daily. (Patient taking differently: Inject 20 Units into the skin daily.) 15 mL 2   insulin  lispro (HUMALOG ) 100 UNIT/ML injection Inject 0.1 mLs (10 Units total) into the skin 3 (three) times daily before meals. 10 mL 3   metFORMIN  (GLUCOPHAGE ) 850 MG tablet TAKE 1 TABLET (850 MG TOTAL) BY MOUTH 2 (TWO) TIMES DAILY WITH A MEAL. 180 tablet 1   NONFORMULARY OR COMPOUNDED ITEM      olmesartan  (BENICAR ) 20 MG tablet Take 1 tablet (20 mg total) by mouth daily. 90 tablet 2   omeprazole  (PRILOSEC) 40 MG capsule Take 1 capsule (40 mg total) by mouth daily. 90 capsule 0   ondansetron  (ZOFRAN -ODT) 4 MG disintegrating tablet Take 1  tablet (4 mg total) by mouth every 8 (eight) hours as needed for nausea or vomiting. 20 tablet 0   rosuvastatin  (CRESTOR ) 20 MG tablet TAKE 1 TABLET BY MOUTH EVERY DAY 90 tablet 1   sildenafil  (VIAGRA ) 100 MG tablet TAKE 1 TABLET BY MOUTH DAILY AS NEEDED 30 tablet 1   triamcinolone  cream (KENALOG ) 0.1 % Apply 1 Application topically 2 (two) times daily. 30 g 0   VENTOLIN  HFA 108 (90 Base) MCG/ACT inhaler TAKE 2 PUFFS BY MOUTH EVERY 6 HOURS AS NEEDED FOR WHEEZE OR SHORTNESS OF BREATH 18 each 0   blood glucose meter kit and supplies Dispense based on patient and insurance preference. Test  1-2 times a day 1 each 0   Continuous Glucose Sensor (FREESTYLE LIBRE 3 PLUS SENSOR) MISC Change sensor every 15 days. 2 each 1   Insulin  Pen Needle (BD PEN NEEDLE NANO 2ND GEN) 32G X 4 MM MISC 1 AT BEDTIME. 100 each 5   Insulin  Pen Needle (BD PEN NEEDLE NANO U/F) 32G X 4 MM MISC 1 each by Does not apply route at bedtime. 100 each 3   No current facility-administered medications on file prior to visit.    The following portions of the patient's history were reviewed and updated as appropriate: allergies, current medications, past family history, past medical history, past social history, past surgical history and problem list.  ROS Otherwise as in subjective above    Objective: BP 132/80   Pulse 84   Wt 242 lb (109.8 kg)   SpO2 97%   BMI 31.50 kg/m   BP Readings from Last 3 Encounters:  06/07/24 132/80  12/22/23 138/84  11/02/23 130/82   Wt Readings from Last 3 Encounters:  06/07/24 242 lb (109.8 kg)  12/22/23 231 lb 12.8 oz (105.1 kg)  11/02/23 226 lb 6.4 oz (102.7 kg)   General appearance: alert, no distress, well developed, well nourished  Diabetic Foot Exam - Simple   Simple Foot Form Visual Inspection No deformities, no ulcerations, no other skin breakdown bilaterally: Yes Sensation Testing Intact to touch and monofilament testing bilaterally: Yes Pulse Check Posterior Tibialis and Dorsalis pulse intact bilaterally: Yes Comments        Assessment: Encounter Diagnoses  Name Primary?   Uncontrolled type 2 diabetes mellitus with hyperglycemia (HCC) Yes   Hypertension associated with diabetes (HCC)    Hyperlipidemia, unspecified hyperlipidemia type    Generalized anxiety disorder    Aortic atherosclerosis    Screening for prostate cancer    Microalbuminuria       Plan: Type 2 diabetes mellitus with obesity, hypertension, hyperlipidemia, and chronic kidney disease Improved glucose control with current regimen. A1c estimated at 6.9. Low glucose events  noted, especially nocturnally. Discussed Rybelsus  addition with caution due to pancreatitis risk. Emphasized weight loss and dietary changes to reduce medication burden. - Continue Lantus  20 units daily. - Continue Jardiance  10 mg daily. - Adjust Humalog  dosage based on meal size and glucose readings, reduce evening dose to prevent nocturnal hypoglycemia. - Continue metformin  850 mg twice daily. - Consider adding back Rybelsus  7 mg daily, caution with dietary habits to avoid pancreatitis. - Ordered hemoglobin A1c - Encouraged weight loss and dietary modifications to reduce fatty food and alcohol intake. - Monitor for hypoglycemia and adjust insulin  regimen as needed.  General Health Maintenance Routine health maintenance discussed. - Ordered PSA test. - Ordered microalbumin test. - Encouraged regular exercise and healthy diet.   Hypertension Continue olmesartan  20 mg daily  Microalbuminuria  Updated labs today  Hyperlipidemia Continue rosuvastatin  Crestor  20 mg daily Labs today.  Last lipid panel at goal  Anxiety - continue wellbutrin     Truth was seen today for medical management of chronic issues.  Diagnoses and all orders for this visit:  Uncontrolled type 2 diabetes mellitus with hyperglycemia (HCC) -     Hemoglobin A1c -     Microalbumin/Creatinine Ratio, Urine  Hypertension associated with diabetes (HCC)  Hyperlipidemia, unspecified hyperlipidemia type  Generalized anxiety disorder  Aortic atherosclerosis  Screening for prostate cancer -     PSA  Microalbuminuria   Spent > 30 minutes face to face with patient in discussion of symptoms, evaluation, plan and recommendations.    Follow up: pending labs

## 2024-06-08 ENCOUNTER — Other Ambulatory Visit: Payer: Self-pay | Admitting: Medical

## 2024-06-08 ENCOUNTER — Ambulatory Visit: Payer: Self-pay | Admitting: Medical

## 2024-06-08 LAB — MICROALBUMIN / CREATININE URINE RATIO
Creatinine, Urine: 103.5 mg/dL
Microalb/Creat Ratio: 59 mg/g{creat} — ABNORMAL HIGH (ref 0–29)
Microalbumin, Urine: 61.4 ug/mL

## 2024-06-08 LAB — HEMOGLOBIN A1C
Est. average glucose Bld gHb Est-mCnc: 197 mg/dL
Hgb A1c MFr Bld: 8.5 % — ABNORMAL HIGH (ref 4.8–5.6)

## 2024-06-08 LAB — PSA: Prostate Specific Ag, Serum: 0.6 ng/mL (ref 0.0–4.0)

## 2024-06-08 MED ORDER — RYBELSUS 7 MG PO TABS: 7.0000 mg | ORAL_TABLET | ORAL | 1 refills | Status: DC

## 2024-06-08 MED ORDER — ONDANSETRON 4 MG PO TBDP: 4.0000 mg | ORAL_TABLET | Freq: Three times a day (TID) | ORAL | 0 refills | Status: AC | PRN

## 2024-06-08 MED ORDER — INSULIN LISPRO 100 UNIT/ML IJ SOLN: 10.0000 [IU] | Freq: Three times a day (TID) | INTRAMUSCULAR | 3 refills | Status: AC

## 2024-06-08 MED ORDER — METFORMIN HCL 850 MG PO TABS: 850.0000 mg | ORAL_TABLET | Freq: Two times a day (BID) | ORAL | 1 refills | Status: AC

## 2024-06-08 MED ORDER — BUPROPION HCL ER (XL) 150 MG PO TB24: 150.0000 mg | ORAL_TABLET | Freq: Every day | ORAL | 3 refills | Status: AC

## 2024-06-08 MED ORDER — FREESTYLE LIBRE 3 PLUS SENSOR MISC: 1 refills | Status: AC

## 2024-06-08 MED ORDER — OLMESARTAN MEDOXOMIL 20 MG PO TABS: 20.0000 mg | ORAL_TABLET | Freq: Every day | ORAL | 2 refills | Status: AC

## 2024-06-08 MED ORDER — LANTUS SOLOSTAR 100 UNIT/ML ~~LOC~~ SOPN: 20.0000 [IU] | PEN_INJECTOR | Freq: Every day | SUBCUTANEOUS | 2 refills | Status: AC

## 2024-06-08 MED ORDER — EMPAGLIFLOZIN 10 MG PO TABS: 10.0000 mg | ORAL_TABLET | Freq: Every day | ORAL | 2 refills | Status: AC

## 2024-06-08 MED ORDER — OMEPRAZOLE 40 MG PO CPDR: 40.0000 mg | DELAYED_RELEASE_CAPSULE | Freq: Every day | ORAL | 1 refills | Status: AC

## 2024-06-08 MED ORDER — BD PEN NEEDLE NANO 2ND GEN 32G X 4 MM MISC: 1.0000 | Freq: Every day | 5 refills | Status: AC

## 2024-06-08 MED ORDER — ROSUVASTATIN CALCIUM 20 MG PO TABS: 20.0000 mg | ORAL_TABLET | Freq: Every day | ORAL | 1 refills | Status: AC

## 2024-06-08 NOTE — Progress Notes (Signed)
 Results through MyChart

## 2024-06-14 NOTE — Progress Notes (Signed)
 Requested eye exam from [facility]

## 2024-06-29 ENCOUNTER — Encounter: Payer: Self-pay | Admitting: Family Medicine

## 2024-06-29 ENCOUNTER — Telehealth: Admitting: Family Medicine

## 2024-06-29 DIAGNOSIS — Z7985 Long-term (current) use of injectable non-insulin antidiabetic drugs: Secondary | ICD-10-CM

## 2024-06-29 DIAGNOSIS — J069 Acute upper respiratory infection, unspecified: Secondary | ICD-10-CM | POA: Diagnosis not present

## 2024-06-29 DIAGNOSIS — E1165 Type 2 diabetes mellitus with hyperglycemia: Secondary | ICD-10-CM

## 2024-06-29 MED ORDER — METHYLPREDNISOLONE 4 MG PO TBPK: ORAL_TABLET | ORAL | 0 refills | Status: AC

## 2024-06-29 NOTE — Progress Notes (Signed)
° °  Name: Dustin Zamora   Date of Visit: 06/29/2024   Date of last visit with me: Visit date not found   CHIEF COMPLAINT:  Chief Complaint  Patient presents with   Acute Visit    Flu like symptoms       Both patient and physician are located in the state of [place]. Visit conducted via telephone and video visit.   HPI:  Discussed the use of AI scribe software for clinical note transcription with the patient, who gave verbal consent to proceed.  History of Present Illness Dustin Zamora is a 48 year old male with diabetes who presents with nasal congestion, sore throat, and dry cough.  He has been experiencing nasal congestion for about two weeks, causing difficulty breathing through his nose and irritation in his chest. No shortness of breath is noted.  Initially, he had a significantly painful sore throat on Sunday, which has improved with gargling and consuming lemon and honey. The sore throat has transitioned to a non-productive dry cough.  He recalls a similar episode in the past that responded well to a steroid pack. He notes that others have experienced similar symptoms lasting about a month, while his symptoms have persisted for two weeks.     OBJECTIVE:       11 /25/2025   10:41 AM  Depression screen PHQ 2/9  Decreased Interest 0  Down, Depressed, Hopeless 0  PHQ - 2 Score 0     BP Readings from Last 3 Encounters:  06/07/24 132/80  12/22/23 138/84  11/02/23 130/82    There were no vitals taken for this visit.   Physical Exam    Physical Exam Constitutional:      Appearance: Normal appearance.  Neurological:     General: No focal deficit present.     Mental Status: He is alert and oriented to person, place, and time. Mental status is at baseline.     ASSESSMENT/PLAN:   Assessment & Plan Viral URI with cough  Uncontrolled type 2 diabetes mellitus with hyperglycemia (HCC)    Assessment and Plan Assessment & Plan Acute upper respiratory infection  with cough and sore throat Symptoms include nasal congestion, sore throat, and dry cough for two weeks. Previous steroid treatment was effective. No shortness of breath reported. - Prescribed steroid pack. - Advised to monitor blood glucose levels due to potential hyperglycemia from steroids. - Instructed to increase Lantus  to 25 units while on steroids.  Type 2 diabetes mellitus with hyperglycemia Diabetes management complicated by steroid use, which can elevate blood glucose levels. - Advised to monitor blood glucose levels closely. - Instructed to adjust insulin  dosage as needed. Increase lantus  by 5 units     Effie Janoski A. Vita MD [facility]
